# Patient Record
Sex: Male | Born: 1975 | Race: Black or African American | Hispanic: No | Smoking: Current some day smoker
Health system: Southern US, Community
[De-identification: ages and names within clinical notes are randomized; demographics above are authoritative.]

## PROBLEM LIST (undated history)

## (undated) DIAGNOSIS — K219 Gastro-esophageal reflux disease without esophagitis: Secondary | ICD-10-CM

## (undated) DIAGNOSIS — I10 Essential (primary) hypertension: Secondary | ICD-10-CM

## (undated) DIAGNOSIS — E785 Hyperlipidemia, unspecified: Secondary | ICD-10-CM

## (undated) HISTORY — DX: Hyperlipidemia, unspecified: E78.5

## (undated) HISTORY — PX: NO PAST SURGERIES: SHX2092

---

## 2005-01-01 ENCOUNTER — Emergency Department: Payer: Self-pay | Admitting: Emergency Medicine

## 2005-02-16 ENCOUNTER — Emergency Department: Payer: Self-pay | Admitting: Emergency Medicine

## 2006-03-23 ENCOUNTER — Emergency Department (HOSPITAL_COMMUNITY): Admission: EM | Admit: 2006-03-23 | Discharge: 2006-03-23 | Payer: Self-pay | Admitting: Emergency Medicine

## 2008-06-03 ENCOUNTER — Emergency Department (HOSPITAL_COMMUNITY): Admission: EM | Admit: 2008-06-03 | Discharge: 2008-06-03 | Payer: Self-pay | Admitting: Emergency Medicine

## 2008-08-14 ENCOUNTER — Emergency Department (HOSPITAL_COMMUNITY): Admission: EM | Admit: 2008-08-14 | Discharge: 2008-08-14 | Payer: Self-pay | Admitting: Emergency Medicine

## 2008-12-16 ENCOUNTER — Emergency Department (HOSPITAL_COMMUNITY): Admission: EM | Admit: 2008-12-16 | Discharge: 2008-12-16 | Payer: Self-pay | Admitting: Family Medicine

## 2010-09-02 LAB — TSH: TSH: 0.939 u[IU]/mL (ref 0.350–4.500)

## 2010-09-02 LAB — POCT I-STAT, CHEM 8
BUN: 14 mg/dL (ref 6–23)
Calcium, Ion: 1.16 mmol/L (ref 1.12–1.32)
Chloride: 105 mEq/L (ref 96–112)

## 2011-10-28 ENCOUNTER — Encounter (HOSPITAL_COMMUNITY): Payer: Self-pay | Admitting: Cardiology

## 2011-10-28 ENCOUNTER — Emergency Department (HOSPITAL_COMMUNITY)
Admission: EM | Admit: 2011-10-28 | Discharge: 2011-10-28 | Disposition: A | Discharge status: Home / Self Care | Payer: Self-pay | Attending: Emergency Medicine | Admitting: Emergency Medicine

## 2011-10-28 DIAGNOSIS — L02519 Cutaneous abscess of unspecified hand: Secondary | ICD-10-CM | POA: Insufficient documentation

## 2011-10-28 DIAGNOSIS — L03119 Cellulitis of unspecified part of limb: Secondary | ICD-10-CM | POA: Insufficient documentation

## 2011-10-28 DIAGNOSIS — L02416 Cutaneous abscess of left lower limb: Secondary | ICD-10-CM

## 2011-10-28 DIAGNOSIS — L02419 Cutaneous abscess of limb, unspecified: Secondary | ICD-10-CM | POA: Insufficient documentation

## 2011-10-28 DIAGNOSIS — E119 Type 2 diabetes mellitus without complications: Secondary | ICD-10-CM | POA: Insufficient documentation

## 2011-10-28 DIAGNOSIS — K219 Gastro-esophageal reflux disease without esophagitis: Secondary | ICD-10-CM | POA: Insufficient documentation

## 2011-10-28 DIAGNOSIS — L03019 Cellulitis of unspecified finger: Secondary | ICD-10-CM | POA: Insufficient documentation

## 2011-10-28 DIAGNOSIS — I10 Essential (primary) hypertension: Secondary | ICD-10-CM | POA: Insufficient documentation

## 2011-10-28 HISTORY — DX: Essential (primary) hypertension: I10

## 2011-10-28 HISTORY — DX: Gastro-esophageal reflux disease without esophagitis: K21.9

## 2011-10-28 MED ORDER — DOXYCYCLINE HYCLATE 100 MG PO CAPS: 100.0000 mg | ORAL_CAPSULE | Freq: Two times a day (BID) | ORAL | Status: DC

## 2011-10-28 MED ORDER — HYDROCODONE-ACETAMINOPHEN 5-325 MG PO TABS: 1.0000 | ORAL_TABLET | Freq: Four times a day (QID) | ORAL | Status: DC | PRN

## 2011-10-28 NOTE — ED Notes (Signed)
Provider at the bedside.  

## 2011-10-28 NOTE — Discharge Instructions (Signed)
Return here in 2 days for a recheck and packing removal. Keep area covered. Clean around the wound.

## 2011-10-28 NOTE — ED Provider Notes (Signed)
Medical screening examination/treatment/procedure(s) were performed by non-physician practitioner and as supervising physician I was immediately available for consultation/collaboration.   Hurman Horn, MD 10/28/11 2350

## 2011-10-28 NOTE — ED Notes (Addendum)
Pt to c/o boil to the inside of left thigh. States that it developed last week but has continued to get worse. Denies any fever or drainage.

## 2011-10-28 NOTE — ED Provider Notes (Signed)
History     CSN: [id number]  Arrival date & time 10/28/11  4098   First MD Initiated Contact with Patient 10/28/11 0745      Chief Complaint  Patient presents with  . Recurrent Skin Infections    (Consider location/radiation/quality/duration/timing/severity/associated sxs/prior treatment) HPI Patient presents emergency department with an abscess to his left middle upper thigh.  Patient states that it developed 2 days ago and it continued to worsen.  Patient states that he tried to soak in warm baths to alleviate the symptoms.  He states this did not seem to help.  Patient denies any fevers, nausea, vomiting, weakness, or dizziness.  Past Medical History  Diagnosis Date  . Hypertension   . Diabetes mellitus   . GERD (gastroesophageal reflux disease)     History reviewed. No pertinent past surgical history.  History reviewed. No pertinent family history.  History  Substance Use Topics  . Smoking status: Not on file  . Smokeless tobacco: Not on file  . Alcohol Use:       Review of Systems All other systems negative except as documented in the HPI. All pertinent positives and negatives as reviewed in the HPI.  Allergies  Review of patient's allergies indicates no known allergies.  Home Medications   Current Outpatient Rx  Name Route Sig Dispense Refill  . GLIPIZIDE 10 MG PO TABS Oral Take 10 mg by mouth 2 (two) times daily before a meal.    . METFORMIN HCL 500 MG PO TABS Oral Take 1,000 mg by mouth 2 (two) times daily with a meal.    . PANTOPRAZOLE SODIUM 40 MG PO TBEC Oral Take 40 mg by mouth daily.    . QUINAPRIL HCL 20 MG PO TABS Oral Take 20 mg by mouth daily.    Marland Kitchen SITAGLIPTIN PHOSPHATE 100 MG PO TABS Oral Take 100 mg by mouth daily.      BP 138/83  Pulse 85  Temp(Src) 98.8 F (37.1 C) (Oral)  Resp 20  SpO2 99%  Physical Exam  Constitutional: He is oriented to person, place, and time. He appears well-developed and well-nourished.  Neurological: He is  alert and oriented to person, place, and time.  Skin:       ED Course  Procedures (including critical care time)  INCISION AND DRAINAGE Performed by: Carlyle Dolly Consent: Verbal consent obtained. Risks and benefits: risks, benefits and alternatives were discussed Type: abscess  Body area: Left medial upper thigh  Anesthesia: local infiltration  Local anesthetic: lidocaine 2% w epinephrine  Anesthetic total: 8 ml  Complexity: complex Blunt dissection to break up loculations  Drainage: purulent  Drainage amount: large  Packing material: 1/4 in iodoform gauze  Patient tolerance: Patient tolerated the procedure well with no immediate complications.   Patient is advised to return here in 2 days for recheck and packing removal.  I told him to keep the area covered and clean around the wound.  There is no surrounding cellulitis noted.  However, there is some mild induration around the area of the abscess.  I advised him that we will treat with antibiotics due to his diabetic status  MDM          Carlyle Dolly, PA-C 10/28/11 [phone]

## 2011-10-30 ENCOUNTER — Encounter (HOSPITAL_COMMUNITY): Payer: Self-pay | Admitting: Emergency Medicine

## 2011-10-30 ENCOUNTER — Emergency Department (HOSPITAL_COMMUNITY)
Admission: EM | Admit: 2011-10-30 | Discharge: 2011-10-30 | Disposition: A | Discharge status: Home / Self Care | Payer: Self-pay | Attending: Emergency Medicine | Admitting: Emergency Medicine

## 2011-10-30 DIAGNOSIS — Z4800 Encounter for change or removal of nonsurgical wound dressing: Secondary | ICD-10-CM | POA: Insufficient documentation

## 2011-10-30 DIAGNOSIS — L0291 Cutaneous abscess, unspecified: Secondary | ICD-10-CM

## 2011-10-30 DIAGNOSIS — K219 Gastro-esophageal reflux disease without esophagitis: Secondary | ICD-10-CM | POA: Insufficient documentation

## 2011-10-30 DIAGNOSIS — E119 Type 2 diabetes mellitus without complications: Secondary | ICD-10-CM | POA: Insufficient documentation

## 2011-10-30 DIAGNOSIS — L02219 Cutaneous abscess of trunk, unspecified: Secondary | ICD-10-CM | POA: Insufficient documentation

## 2011-10-30 DIAGNOSIS — I10 Essential (primary) hypertension: Secondary | ICD-10-CM | POA: Insufficient documentation

## 2011-10-30 NOTE — ED Notes (Signed)
Pt reports he was seen here two days ago and told to return for wound packing removal to left groin.

## 2011-10-30 NOTE — ED Provider Notes (Signed)
History     CSN: [id number]  Arrival date & time 10/30/11  4098   First MD Initiated Contact with Patient 10/30/11 [phone]      Chief Complaint  Patient presents with  . Wound Check    (Consider location/radiation/quality/duration/timing/severity/associated sxs/prior treatment) Patient is a 36 y.o. male presenting with wound check. The history is provided by the patient. No language interpreter was used.  Wound Check  He was treated in the ED 2 to 3 days ago. Previous treatment in the ED includes I&D of abscess. Treatments since wound repair include oral antibiotics. There has been no drainage from the wound. The redness has improved. There is no swelling present. The pain has no pain.  Pt here for packing removal  Past Medical History  Diagnosis Date  . Hypertension   . Diabetes mellitus   . GERD (gastroesophageal reflux disease)     History reviewed. No pertinent past surgical history.  History reviewed. No pertinent family history.  History  Substance Use Topics  . Smoking status: Not on file  . Smokeless tobacco: Not on file  . Alcohol Use:       Review of Systems  Skin: Positive for wound.  All other systems reviewed and are negative.    Allergies  Review of patient's allergies indicates no known allergies.  Home Medications   Current Outpatient Rx  Name Route Sig Dispense Refill  . DOXYCYCLINE HYCLATE 100 MG PO CAPS Oral Take 100 mg by mouth 2 (two) times daily.    Marland Kitchen GLIPIZIDE 10 MG PO TABS Oral Take 10 mg by mouth 2 (two) times daily before a meal.    . IBUPROFEN 200 MG PO TABS Oral Take 800 mg by mouth every 6 (six) hours as needed. For pain    . METFORMIN HCL 500 MG PO TABS Oral Take 1,000 mg by mouth 2 (two) times daily with a meal.    . PANTOPRAZOLE SODIUM 40 MG PO TBEC Oral Take 40 mg by mouth daily.    . QUINAPRIL HCL 20 MG PO TABS Oral Take 20 mg by mouth daily.    Marland Kitchen SITAGLIPTIN PHOSPHATE 100 MG PO TABS Oral Take 100 mg by mouth daily.      BP  132/85  Pulse 76  Temp(Src) 98 F (36.7 C) (Oral)  Resp 18  SpO2 98%  Physical Exam  Constitutional: He is oriented to person, place, and time. He appears well-developed and well-nourished.  HENT:  Head: Normocephalic and atraumatic.  Musculoskeletal: Normal range of motion.       Packing removed left groin,  Abscess healing  Neurological: He is alert and oriented to person, place, and time. He has normal reflexes.  Skin: Skin is warm.  Psychiatric: He has a normal mood and affect.    ED Course  Procedures (including critical care time)  Labs Reviewed - No data to display No results found.   1. Abscess       MDM          Elson Areas, [place] 10/30/11 [phone]

## 2011-10-30 NOTE — ED Provider Notes (Signed)
Medical screening examination/treatment/procedure(s) were performed by non-physician practitioner and as supervising physician I was immediately available for consultation/collaboration.   Leigh-Ann Jalena Vanderlinden, MD 10/30/11 1055 

## 2011-10-30 NOTE — Discharge Instructions (Signed)

## 2012-07-01 ENCOUNTER — Emergency Department (INDEPENDENT_AMBULATORY_CARE_PROVIDER_SITE_OTHER)
Admission: EM | Admit: 2012-07-01 | Discharge: 2012-07-01 | Disposition: A | Discharge status: Home / Self Care | Payer: Self-pay | Source: Home / Self Care | Attending: Emergency Medicine | Admitting: Emergency Medicine

## 2012-07-01 ENCOUNTER — Encounter (HOSPITAL_COMMUNITY): Payer: Self-pay

## 2012-07-01 ENCOUNTER — Emergency Department (INDEPENDENT_AMBULATORY_CARE_PROVIDER_SITE_OTHER): Payer: Self-pay

## 2012-07-01 DIAGNOSIS — S6990XA Unspecified injury of unspecified wrist, hand and finger(s), initial encounter: Secondary | ICD-10-CM

## 2012-07-01 DIAGNOSIS — S6980XA Other specified injuries of unspecified wrist, hand and finger(s), initial encounter: Secondary | ICD-10-CM
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
## 2013-05-03 ENCOUNTER — Encounter (HOSPITAL_COMMUNITY): Payer: Self-pay | Admitting: Emergency Medicine

## 2013-05-03 ENCOUNTER — Emergency Department (INDEPENDENT_AMBULATORY_CARE_PROVIDER_SITE_OTHER)
Admission: EM | Admit: 2013-05-03 | Discharge: 2013-05-03 | Disposition: A | Discharge status: Home / Self Care | Payer: Self-pay | Source: Home / Self Care | Attending: Family Medicine | Admitting: Family Medicine

## 2013-05-03 DIAGNOSIS — R05 Cough: Secondary | ICD-10-CM

## 2013-05-03 MED ORDER — IPRATROPIUM BROMIDE 0.06 % NA SOLN: 2.0000 | Freq: Four times a day (QID) | NASAL | Status: DC

## 2013-05-03 MED ORDER — HYDROCODONE-ACETAMINOPHEN 5-325 MG PO TABS: 0.5000 | ORAL_TABLET | Freq: Every evening | ORAL | Status: DC | PRN

## 2013-05-03 MED ORDER — AZITHROMYCIN 250 MG PO TABS: 250.0000 mg | ORAL_TABLET | Freq: Every day | ORAL | Status: DC

## 2013-05-03 NOTE — ED Provider Notes (Signed)
Christopher Abbott is a 37 y.o. male who presents to Urgent Care today for cough congestion and mild throat tickling present for the last several days. Patient recently traveled to [place]. He typically gets his symptoms this time either when he travels. He has tried some over-the-counter medications including Clarinex which have only helped a little. He denies any shortness of breath fevers chills nausea vomiting or diarrhea. He denies any chest pains or palpitations as well. 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 He is worried that he will be worse by then.    Past Medical History  Diagnosis Date  . Hypertension   . Diabetes mellitus   . GERD (gastroesophageal reflux disease)    History  Substance Use Topics  . Smoking status: Current Every Day Smoker  . Smokeless tobacco: Not on file  . Alcohol Use: Yes   ROS as above Medications reviewed. No current facility-administered medications for this encounter.   Current Outpatient Prescriptions  Medication Sig Dispense Refill  . azithromycin (ZITHROMAX) 250 MG tablet Take 1 tablet (250 mg total) by mouth daily. Take first 2 tablets together, then 1 every day until finished.  6 tablet  0  . glipiZIDE (GLUCOTROL) 10 MG tablet Take 10 mg by mouth 2 (two) times daily before a meal.      . HYDROcodone-acetaminophen (NORCO/VICODIN) 5-325 MG per tablet Take 0.5 tablets by mouth at bedtime as needed (cough).  6 tablet  0  . ibuprofen (ADVIL,MOTRIN) 200 MG tablet Take 800 mg by mouth every 6 (six) hours as needed. For pain      . ipratropium (ATROVENT) 0.06 % nasal spray Place 2 sprays into both nostrils 4 (four) times daily.  15 mL  1  . metFORMIN (GLUCOPHAGE) 500 MG tablet Take 1,000 mg by mouth 2 (two) times daily with a meal.      . pantoprazole (PROTONIX) 40 MG tablet Take 40 mg by mouth daily.      . quinapril (ACCUPRIL) 20 MG tablet Take 20 mg by mouth daily.       . sitaGLIPtin (JANUVIA) 100 MG tablet Take 100 mg by mouth daily.        Exam:  BP 145/113  Pulse 92  Temp(Src) 98 F (36.7 C) (Oral)  Resp 16  SpO2 96% Gen: Well NAD HEENT: EOMI,  MMM, tympanic membranes are normal appearing bilaterally. Posterior pharynx with cobblestoning. Lungs: Normal work of breathing. CTABL Heart: RRR no MRG Abd: NABS, Soft. NT, ND Exts: Non edematous BL  LE, warm and well perfused.    Assessment and Plan: 37 y.o. male with cough. Likely postviral. Postnasal drip is a contributing factor. Plan to treat with Atrovent nasal spray, as well as hydrocodone containing cough medication. Additionally I have prescribed azithromycin to use if patient does not improve. Discussed warning signs or symptoms. Please see discharge instructions. Patient expresses understanding.      Rodolph Bong, MD 05/03/13 [phone]

## 2013-05-03 NOTE — ED Notes (Signed)
C/o cough and itchy throat OTC medications taken but no relief. States cough is dry

## 2013-08-27 ENCOUNTER — Encounter (HOSPITAL_COMMUNITY): Payer: Self-pay | Admitting: Emergency Medicine

## 2013-08-27 ENCOUNTER — Emergency Department (INDEPENDENT_AMBULATORY_CARE_PROVIDER_SITE_OTHER)
Admission: EM | Admit: 2013-08-27 | Discharge: 2013-08-27 | Disposition: A | Discharge status: Home / Self Care | Payer: Self-pay | Source: Home / Self Care | Attending: Emergency Medicine | Admitting: Emergency Medicine

## 2013-08-27 DIAGNOSIS — L0291 Cutaneous abscess, unspecified: Secondary | ICD-10-CM

## 2013-08-27 DIAGNOSIS — L0390 Cellulitis, unspecified: Secondary | ICD-10-CM

## 2013-08-27 MED ORDER — HYDROCODONE-ACETAMINOPHEN 5-325 MG PO TABS: ORAL_TABLET | ORAL | Status: DC

## 2013-08-27 MED ORDER — SULFAMETHOXAZOLE-TMP DS 800-160 MG PO TABS: 2.0000 | ORAL_TABLET | Freq: Two times a day (BID) | ORAL | Status: DC

## 2013-08-27 MED ORDER — MUPIROCIN 2 % EX OINT: 1.0000 "application " | TOPICAL_OINTMENT | Freq: Three times a day (TID) | CUTANEOUS | Status: DC
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
## 2013-08-27 NOTE — Discharge Instructions (Signed)
You have had an abscess drained.  An abscess is a collection of pus caused by infection with skin bacteria such as Streptococcus or Staphylococcus.  Since this is and infection, you may be contagious. ° °For the first 2 days, leave the dressing in place and keep it clean and dry. This means you should not get it wet.  You will have to take a sponge bath rather than a shower.  If the abscess was packed, we may instruct you to come back in 2 to 3 days to have the packing removed.  If the abscess was not packed, you may remove the dressing yourself in 2 days and take care of the wound yourself. ° °After the packing is out, change the dressing at least once a day.  You may bathe or shower once the packing has been removed.  Assemble all the dressing material before you change the dressing, wear gloves, dispose of the soiled dressing material well and wash your hands before and after changing the dressing.  Wash the area well with soap and water, taking care to remove all the dried blood and drainage.  Apply a thin layer of antibiotic ointment (Bacitracin or Polysporin) around the abscess cavity, then apply a gauze dressing.  You may want to use a non-adherent dressing like Telfa.  Fasten this in place well with tape.  Continue to change the dressing until there is no further drainage. ° °Finish up the entire prescription of any antibiotics that you have been given. ° °Take infectious precautions since the bacteria that cause these abscesses may be contagious.  Wash hands frequently or use hand sanitizer, especially after touching the abscess area or changing dressings.  Do not allow anyone else to use your towel or washcloth and wash these items after each use until the abscess has healed.  You may want to use an antibacterial soap such as Dial or Safeguard or a prescription body wash like Hibiclens.  You also may want to consider spraying the tub or shower with a disinfectant such a Lysol until the abscess has  healed. ° °Things that should prompt you to return to the office for a recheck include:  Fever over 100 degrees, increasing pain or drainage, failure of the abscess to heal after 10 days, or other skin lesions elsewhere.  ° °Bacterial infection is a leading cause of boils, abscesses and skin infections.  While this can be the cause of serious infections, it is most often easily treated, prevented, and cured. ° °This bacteria,like other bacterial infections, is something you catch from somebody or something.  It can be transmitted from family, friends, casual acquaintances or even pets by close person-to-person contact or contact with an infected object.  Bacteria live on the skin and in the nasal passages.  It can invade into the skin through a break in the skin or sometimes it just invades by itself into a skin pore causing an area of redness, swelling and pain.  When this happens, it can cause a sticking sensation, so many people mistake it for an insect bite.   ° °If it causes a collection of pus (a boil or abscess) it should be drained.  Often packing or a drain will be left in place to allow the pus to drain for a few days.  This packing will need to be removed after 2 to 3 days.  If the wound is deep, it may need to be repacked.  If there is no collection of   pus (cellulitis) incision and drainage is not immediately necessary, but antibiotics will be prescribed.  Sometimes cellulitis will turn into an abscess, so if symptoms persist, it's best to return for a recheck. ° °With bacterial infection, recurrence can be a problem.  This is because the bacteria can continue to live on the skin and in the nasal passages, presenting a risk for re-infection.  There are some steps that you can take to completely eradicate the infection and thus prevent future infections: ° °· Finish up your antibiotics completely.   °· Bacteria often take up residence in the nasal passages.  If they are not eliminated from this reservoir,  the infection will return again and again.  Apply an antibiotic ointment, mupirocin, to both nostrils 3 times daily for a month.  °· Decontamination of the skin is also necessary. The current recommendation is Clorox baths twice weekly for 3 months.  This can be done by diluting 4 oz of Clorox bleach in a tub of bathwater.  Then soak in this Clorox solution up to your neck for 15 minutes. When you get out of the tub, do not towel dry, allow the Clorox water to dry off your skin on its own.  °· Take infectious precautions:  Wash or sanitize hands frequently, spray tub or shower with Lysol after use, us towels or wash cloths only once, then launder, do not share towels or wash cloths with anyone else, do not share clothing with anyone else, launder clothing and bed clothes frequently. ° ° ° ° °

## 2013-08-27 NOTE — ED Provider Notes (Signed)
  Chief Complaint   Chief Complaint  Patient presents with  . Recurrent Skin Infections    History of Present Illness   Christopher Abbott is a 38 year old diabetic male who has a 4 to five-day history of a painful boil in his right groin. He has a history of a boil previously in his left groin. No definite history of MRSA. He denies any drainage from the boil, fever, or chills.  Review of Systems   Other than as noted above, the patient denies any of the following symptoms: Systemic:  No fever, chills or sweats. Skin:  No rash or itching.  PMFSH   Past medical history, family history, social history, meds, and allergies were reviewed. He has diabetes and high blood pressure. He takes insulin, Janumet, quinapril, and Nexium.  Physical Examination     Vital signs:  BP 139/84  Pulse 90  Temp(Src) 99.1 F (37.3 C) (Oral)  Resp 14  SpO2 98% Skin:  There is a 2.5 x 2.5 cm tender, fluctuant, raised nodule in the right groin.  Skin exam was otherwise normal.  No rash. Ext:  Distal pulses were full, patient has full ROM of all joints.  Procedure   Verbal informed consent was obtained.  The patient was informed of the risks and benefits of the procedure and understands and accepts.  A time out was called and the identity of the patient and correct procedure was confirmed.   The abscess area described above was prepped with Betadine and alcohol and anesthetized with ethyl chloride spray and 5 mL of 2% Xylocaine with epinephrine.  Using a #11 scalpel blade, a singe straight incision was made into the area of fluctulence, yielding a moderate amount of prurulent drainage.  Routine cultures were obtained.  Blunt dissection was used to break up loculations and the resulting wound cavity was packed with 1/4 inch Iodoform gauze.  A sterile pressure dressing was applied.  Assessment   The encounter diagnosis was Abscess.  Plan     1.  Meds:  The following meds were prescribed:   Discharge  Medication List as of 08/27/2013  6:47 PM    START taking these medications   Details  !! HYDROcodone-acetaminophen (NORCO/VICODIN) 5-325 MG per tablet 1 to 2 tabs every 4 to 6 hours as needed for pain., Print    mupirocin ointment (BACTROBAN) 2 % Apply 1 application topically 3 (three) times daily., Starting 08/27/2013, Until Discontinued, Normal    sulfamethoxazole-trimethoprim (BACTRIM DS) 800-160 MG per tablet Take 2 tablets by mouth 2 (two) times daily., Starting 08/27/2013, Until Discontinued, Normal     !! - Potential duplicate medications found. Please discuss with provider.      2.  Patient Education/Counseling:  The patient was given appropriate handouts, self care instructions, and instructed in symptomatic relief.    3.  Follow up:  The patient was instructed to leave the dressing in place and return here again in 48 hours for packing removal, or sooner if becoming worse in any way, and given some red flag symptoms such as fever which would prompt immediate return.       Reuben Likesavid C Lezlie Ritchey, MD 08/27/13 2115

## 2013-08-31 ENCOUNTER — Emergency Department (INDEPENDENT_AMBULATORY_CARE_PROVIDER_SITE_OTHER)
Admission: EM | Admit: 2013-08-31 | Discharge: 2013-08-31 | Disposition: A | Discharge status: Home / Self Care | Payer: Self-pay | Source: Home / Self Care

## 2013-08-31 ENCOUNTER — Encounter (HOSPITAL_COMMUNITY): Payer: Self-pay | Admitting: Emergency Medicine

## 2013-08-31 DIAGNOSIS — L02219 Cutaneous abscess of trunk, unspecified: Secondary | ICD-10-CM

## 2013-08-31 DIAGNOSIS — L02419 Cutaneous abscess of limb, unspecified: Secondary | ICD-10-CM

## 2013-08-31 DIAGNOSIS — L03319 Cellulitis of trunk, unspecified: Secondary | ICD-10-CM

## 2013-08-31 DIAGNOSIS — L03115 Cellulitis of right lower limb: Secondary | ICD-10-CM

## 2013-08-31 DIAGNOSIS — L02214 Cutaneous abscess of groin: Secondary | ICD-10-CM

## 2013-08-31 DIAGNOSIS — L03119 Cellulitis of unspecified part of limb: Secondary | ICD-10-CM

## 2013-08-31 LAB — CULTURE, ROUTINE-ABSCESS
Culture: NO GROWTH
Gram Stain: NONE SEEN

## 2013-08-31 MED ORDER — OXYCODONE-ACETAMINOPHEN 5-325 MG PO TABS: 1.0000 | ORAL_TABLET | ORAL | Status: DC | PRN

## 2013-08-31 MED ORDER — CLINDAMYCIN HCL 300 MG PO CAPS: 300.0000 mg | ORAL_CAPSULE | Freq: Four times a day (QID) | ORAL | Status: DC

## 2013-08-31 NOTE — Discharge Instructions (Signed)
Cellulitis Frequent warm compresses Cellulitis is an infection of the skin and the tissue beneath it. The infected area is usually red and tender. Cellulitis occurs most often in the arms and lower legs.  CAUSES  Cellulitis is caused by bacteria that enter the skin through cracks or cuts in the skin. The most common types of bacteria that cause cellulitis are Staphylococcus and Streptococcus. SYMPTOMS   Redness and warmth.  Swelling.  Tenderness or pain.  Fever. DIAGNOSIS  Your caregiver can usually determine what is wrong based on a physical exam. Blood tests may also be done. TREATMENT  Treatment usually involves taking an antibiotic medicine. HOME CARE INSTRUCTIONS   Take your antibiotics as directed. Finish them even if you start to feel better.  Keep the infected arm or leg elevated to reduce swelling.  Apply a warm cloth to the affected area up to 4 times per day to relieve pain.  Only take over-the-counter or prescription medicines for pain, discomfort, or fever as directed by your caregiver.  Keep all follow-up appointments as directed by your caregiver. SEEK MEDICAL CARE IF:   You notice red streaks coming from the infected area.  Your red area gets larger or turns dark in color.  Your bone or joint underneath the infected area becomes painful after the skin has healed.  Your infection returns in the same area or another area.  You notice a swollen bump in the infected area.  You develop new symptoms. SEEK IMMEDIATE MEDICAL CARE IF:   You have a fever.  You feel very sleepy.  You develop vomiting or diarrhea.  You have a general ill feeling (malaise) with muscle aches and pains. MAKE SURE YOU:   Understand these instructions.  Will watch your condition.  Will get help right away if you are not doing well or get worse. Document Released: 02/20/2005 Document Revised: 11/12/2011 Document Reviewed: 07/29/2011 [facility] Patient Information 2014  [place]Care, [place].

## 2013-08-31 NOTE — ED Provider Notes (Signed)
CSN: [id number]     Arrival date & time 08/31/13  0900 History   First MD Initiated Contact with Patient 08/31/13 [phone]     Chief Complaint  Patient presents with  . Follow-up   (Consider location/radiation/quality/duration/timing/severity/associated sxs/prior Treatment) HPI Comments: Wound check following I and D of an abscess within the R groin. St pain continues without improvement and may be getting worse.  The elongated area of induration  Measures approx 6 x 3.5 cm. Not palpate as fluctuant.  There is cutaneous erythema developing distally over the medial thigh.  He had pulled the packing out 2 d post I and D. No purulent drainage now, just scant serous fluid.  Not regularly applying warm compresses   Past Medical History  Diagnosis Date  . Hypertension   . Diabetes mellitus   . GERD (gastroesophageal reflux disease)    History reviewed. No pertinent past surgical history. History reviewed. No pertinent family history. History  Substance Use Topics  . Smoking status: Current Every Day Smoker  . Smokeless tobacco: Not on file  . Alcohol Use: Yes    Review of Systems  Constitutional: Negative.   Skin: Positive for color change and wound.       As per HPI    Allergies  Review of patient's allergies indicates no known allergies.  Home Medications   Current Outpatient Rx  Name  Route  Sig  Dispense  Refill  . azithromycin (ZITHROMAX) 250 MG tablet   Oral   Take 1 tablet (250 mg total) by mouth daily. Take first 2 tablets together, then 1 every day until finished.   6 tablet   0   . glipiZIDE (GLUCOTROL) 10 MG tablet   Oral   Take 10 mg by mouth 2 (two) times daily before a meal.         . HYDROcodone-acetaminophen (NORCO/VICODIN) 5-325 MG per tablet   Oral   Take 0.5 tablets by mouth at bedtime as needed (cough).   6 tablet   0   . metFORMIN (GLUCOPHAGE) 500 MG tablet   Oral   Take 1,000 mg by mouth 2 (two) times daily with a meal.         . mupirocin  ointment (BACTROBAN) 2 %   Topical   Apply 1 application topically 3 (three) times daily.   22 g   0   . pantoprazole (PROTONIX) 40 MG tablet   Oral   Take 40 mg by mouth daily.         . quinapril (ACCUPRIL) 20 MG tablet   Oral   Take 20 mg by mouth daily.         . sitaGLIPtin (JANUVIA) 100 MG tablet   Oral   Take 100 mg by mouth daily.         . clindamycin (CLEOCIN) 300 MG capsule   Oral   Take 1 capsule (300 mg total) by mouth 4 (four) times daily. X 10 days   28 capsule   0   . HYDROcodone-acetaminophen (NORCO/VICODIN) 5-325 MG per tablet      1 to 2 tabs every 4 to 6 hours as needed for pain.   20 tablet   0   . ibuprofen (ADVIL,MOTRIN) 200 MG tablet   Oral   Take 800 mg by mouth every 6 (six) hours as needed. For pain         . ipratropium (ATROVENT) 0.06 % nasal spray   Each Nare   Place 2 sprays  into both nostrils 4 (four) times daily.   15 mL   1   . oxyCODONE-acetaminophen (ROXICET) 5-325 MG per tablet   Oral   Take 1 tablet by mouth every 4 (four) hours as needed for severe pain.   15 tablet   0   . sulfamethoxazole-trimethoprim (BACTRIM DS) 800-160 MG per tablet   Oral   Take 2 tablets by mouth 2 (two) times daily.   40 tablet   0    BP 126/85  Pulse 80  Temp(Src) 98.5 F (36.9 C) (Oral)  Resp 16  SpO2 98% Physical Exam  Nursing note and vitals reviewed. Constitutional: He is oriented to person, place, and time. He appears well-developed and well-nourished. No distress.  Neurological: He is alert and oriented to person, place, and time.  Skin: Skin is warm and dry. No rash noted. There is erythema.  See HPI for wound description.  Psychiatric: He has a normal mood and affect.    ED Course  Procedures (including critical care time) Labs Review Labs Reviewed - No data to display Imaging Review No results found.   MDM   1. Cellulitis of right thigh   2. Soft tissue abscess of inguinal region     The prelim culture is  No Growth. No identifiable organisms Cont the Septra and add clindamycin Percocet 5 mg #15 Rech in 2 days, sooner if worse.     Hayden Rasmussenavid Alhassan Everingham, NP 08/31/13 [phone]

## 2013-08-31 NOTE — ED Provider Notes (Signed)
Medical screening examination/treatment/procedure(s) were performed by resident physician or non-physician practitioner and as supervising physician I was immediately available for consultation/collaboration.   Barkley BrunsKINDL,Doreatha Offer DOUGLAS MD.   Linna HoffJames D Nohelani Benning, MD 08/31/13 [phone]

## 2013-08-31 NOTE — ED Notes (Signed)
Pt here for right groin abscess.  Still having pain.  States swelling going down side of right leg.

## 2013-09-01 ENCOUNTER — Encounter (HOSPITAL_COMMUNITY): Payer: Self-pay | Admitting: Emergency Medicine

## 2013-09-01 ENCOUNTER — Emergency Department (HOSPITAL_COMMUNITY)
Admission: EM | Admit: 2013-09-01 | Discharge: 2013-09-01 | Disposition: A | Discharge status: Home / Self Care | Payer: Self-pay | Attending: Emergency Medicine | Admitting: Emergency Medicine

## 2013-09-01 DIAGNOSIS — I10 Essential (primary) hypertension: Secondary | ICD-10-CM | POA: Insufficient documentation

## 2013-09-01 DIAGNOSIS — L02419 Cutaneous abscess of limb, unspecified: Secondary | ICD-10-CM | POA: Insufficient documentation

## 2013-09-01 DIAGNOSIS — F17200 Nicotine dependence, unspecified, uncomplicated: Secondary | ICD-10-CM | POA: Insufficient documentation

## 2013-09-01 DIAGNOSIS — K219 Gastro-esophageal reflux disease without esophagitis: Secondary | ICD-10-CM | POA: Insufficient documentation

## 2013-09-01 DIAGNOSIS — L02415 Cutaneous abscess of right lower limb: Secondary | ICD-10-CM

## 2013-09-01 DIAGNOSIS — T4995XA Adverse effect of unspecified topical agent, initial encounter: Secondary | ICD-10-CM | POA: Insufficient documentation

## 2013-09-01 DIAGNOSIS — Z79899 Other long term (current) drug therapy: Secondary | ICD-10-CM | POA: Insufficient documentation

## 2013-09-01 DIAGNOSIS — Z792 Long term (current) use of antibiotics: Secondary | ICD-10-CM | POA: Insufficient documentation

## 2013-09-01 DIAGNOSIS — L03119 Cellulitis of unspecified part of limb: Secondary | ICD-10-CM

## 2013-09-01 DIAGNOSIS — T783XXA Angioneurotic edema, initial encounter: Secondary | ICD-10-CM | POA: Insufficient documentation

## 2013-09-01 DIAGNOSIS — E119 Type 2 diabetes mellitus without complications: Secondary | ICD-10-CM | POA: Insufficient documentation

## 2013-09-01 LAB — CBC WITH DIFFERENTIAL/PLATELET
Basophils Absolute: 0 10*3/uL (ref 0.0–0.1)
Basophils Relative: 1 % (ref 0–1)
EOS ABS: 0.2 10*3/uL (ref 0.0–0.7)
EOS PCT: 2 % (ref 0–5)
HEMATOCRIT: 42.4 % (ref 39.0–52.0)
Hemoglobin: 15.2 g/dL (ref 13.0–17.0)
LYMPHS ABS: 1.6 10*3/uL (ref 0.7–4.0)
LYMPHS PCT: 25 % (ref 12–46)
MCH: 32.1 pg (ref 26.0–34.0)
MCHC: 35.8 g/dL (ref 30.0–36.0)
MCV: 89.5 fL (ref 78.0–100.0)
MONO ABS: 1 10*3/uL (ref 0.1–1.0)
MONOS PCT: 16 % — AB (ref 3–12)
Neutro Abs: 3.8 10*3/uL (ref 1.7–7.7)
Neutrophils Relative %: 56 % (ref 43–77)
Platelets: 239 10*3/uL (ref 150–400)
RBC: 4.74 MIL/uL (ref 4.22–5.81)
RDW: 13.1 % (ref 11.5–15.5)
WBC: 6.6 10*3/uL (ref 4.0–10.5)

## 2013-09-01 LAB — BASIC METABOLIC PANEL
BUN: 13 mg/dL (ref 6–23)
CALCIUM: 9.4 mg/dL (ref 8.4–10.5)
CO2: 23 meq/L (ref 19–32)
CREATININE: 1.14 mg/dL (ref 0.50–1.35)
Chloride: 102 mEq/L (ref 96–112)
GFR calc Af Amer: >90 mL/min (ref >90)
GFR, EST NON AFRICAN AMERICAN: 82 mL/min — AB (ref >90)
GLUCOSE: 100 mg/dL — AB (ref 70–99)
Potassium: 4 mEq/L (ref 3.7–5.3)
Sodium: 140 mEq/L (ref 137–147)

## 2013-09-01 LAB — SEDIMENTATION RATE: Sed Rate: 30 mm/hr — ABNORMAL HIGH (ref 0–16)

## 2013-09-01 MED ORDER — DIPHENHYDRAMINE HCL 50 MG/ML IJ SOLN
25.0000 mg | Freq: Once | INTRAMUSCULAR | Status: AC
  Administered 2013-09-01: 25 mg via INTRAVENOUS
  Filled 2013-09-01: qty 1

## 2013-09-01 MED ORDER — OXYCODONE-ACETAMINOPHEN 5-325 MG PO TABS
1.0000 | ORAL_TABLET | Freq: Once | ORAL | Status: AC
  Administered 2013-09-01: 1 via ORAL
  Filled 2013-09-01: qty 1

## 2013-09-01 MED ORDER — FAMOTIDINE IN NACL 20-0.9 MG/50ML-% IV SOLN
20.0000 mg | Freq: Once | INTRAVENOUS | Status: AC
  Administered 2013-09-01: 20 mg via INTRAVENOUS
  Filled 2013-09-01: qty 50

## 2013-09-01 NOTE — Discharge Planning (Signed)
[facility] Felicia E, [employer] [employer]iaison  Spoke to patient about primary care resources and establishing care with a provider. Patient states he is seen on a primary care basis at the [facility] [place] and has been seen at the practice for years. Patient states he is comfortable at the clinic and does not want to change at this time. I provided the patient with the orange card application and instructions for future uses if needed. Patient was also given my contact information for any future questions or concerns.

## 2013-09-01 NOTE — Discharge Instructions (Signed)
Continue taking your Clindamycin. Stop taking Quinapril (Accupril) - that is what was making your lip/cheek swell. You should never take any ACE inhibitor again, or you could have the same reaction.   Angioedema Angioedema is a sudden swelling of tissues, often of the skin. It can occur on the face or genitals or in the abdomen or other body parts. The swelling usually develops over a short period and gets better in 24 to 48 hours. It often begins during the night and is found when the person wakes up. The person may also get red, itchy patches of skin (hives). Angioedema can be dangerous if it involves swelling of the air passages.  Depending on the cause, episodes of angioedema may only happen once, come back in unpredictable patterns, or repeat for several years and then gradually fade away.  CAUSES  Angioedema can be caused by an allergic reaction to various triggers. It can also result from nonallergic causes, including reactions to drugs, immune system disorders, viral infections, or an abnormal gene that is passed to you from your parents (hereditary). For some people with angioedema, the cause is unknown.  Some things that can trigger angioedema include:   Foods.   Medicines, such as ACE inhibitors, ARBs, nonsteroidal anti-inflammatory agents, or estrogen.   Latex.   Animal saliva.   Insect stings.   Dyes used in X-rays.   Mild injury.   Dental work.  Surgery.  Stress.   Sudden changes in temperature.   Exercise. SIGNS AND SYMPTOMS   Swelling of the skin.  Hives. If these are present, there is also intense itching.  Redness in the affected area.   Pain in the affected area.  Swollen lips or tongue.  Breathing problems. This may happen if the air passages swell.  Wheezing. If internal organs are involved, there may be:   Nausea.   Abdominal pain.   Vomiting.   Difficulty swallowing.   Difficulty passing urine. DIAGNOSIS   Your health  care provider will examine the affected area and take a medical and family history.  Various tests may be done to help determine the cause. Tests may include:  Allergy skin tests to see if the problem is an allergic reaction.   Blood tests to check for hereditary angioedema.   Tests to check for underlying diseases that could cause the condition.   A review of your medicines, including over the counter medicines, may be done. TREATMENT  Treatment will depend on the cause of the angioedema. Possible treatments include:   Removal of anything that triggered the condition (such as stopping certain medicines).   Medicines to treat symptoms or prevent attacks. Medicines given may include:   Antihistamines.   Epinephrine injection.   Steroids.   Hospitalization may be required for severe attacks. If the air passages are affected, it can be an emergency. Tubes may need to be placed to keep the airway open. HOME CARE INSTRUCTIONS   Only take over-the-counter or prescription medicines as directed by your health care provider.  If you were given medicines for emergency allergy treatment, always carry them with you.  Wear a medical bracelet as directed by your health care provider.   Avoid known triggers. SEEK MEDICAL CARE IF:   You have repeat attacks of angioedema.   Your attacks are more frequent or more severe despite preventive measures.   You have hereditary angioedema and are considering having children. It is important to discuss the risks of passing the condition on to your  children with your health care provider. SEEK IMMEDIATE MEDICAL CARE IF:   You have severe swelling of the mouth, tongue, or lips.  You have difficulty breathing.   You have difficulty swallowing.   You faint. MAKE SURE YOU:  Understand these instructions.  Will watch your condition.  Will get help right away if you are not doing well or get worse. Document Released: 07/22/2001  Document Revised: 03/03/2013 Document Reviewed: 01/04/2013 [facility] Patient Information 2014 [employer], [place].   Abscess An abscess is an infected area that contains a collection of pus and debris.It can occur in almost any part of the body. An abscess is also known as a furuncle or boil. CAUSES  An abscess occurs when tissue gets infected. This can occur from blockage of oil or sweat glands, infection of hair follicles, or a minor injury to the skin. As the body tries to fight the infection, pus collects in the area and creates pressure under the skin. This pressure causes pain. People with weakened immune systems have difficulty fighting infections and get certain abscesses more often.  SYMPTOMS Usually an abscess develops on the skin and becomes a painful mass that is red, warm, and tender. If the abscess forms under the skin, you may feel a moveable soft area under the skin. Some abscesses break open (rupture) on their own, but most will continue to get worse without care. The infection can spread deeper into the body and eventually into the bloodstream, causing you to feel ill.  DIAGNOSIS  Your caregiver will take your medical history and perform a physical exam. A sample of fluid may also be taken from the abscess to determine what is causing your infection. TREATMENT  Your caregiver may prescribe antibiotic medicines to fight the infection. However, taking antibiotics alone usually does not cure an abscess. Your caregiver may need to make a small cut (incision) in the abscess to drain the pus. In some cases, gauze is packed into the abscess to reduce pain and to continue draining the area. HOME CARE INSTRUCTIONS   Only take over-the-counter or prescription medicines for pain, discomfort, or fever as directed by your caregiver.  If you were prescribed antibiotics, take them as directed. Finish them even if you start to feel better.  If gauze is used, follow your caregiver's directions for  changing the gauze.  To avoid spreading the infection:  Keep your draining abscess covered with a bandage.  Wash your hands well.  Do not share personal care items, towels, or whirlpools with others.  Avoid skin contact with others.  Keep your skin and clothes clean around the abscess.  Keep all follow-up appointments as directed by your caregiver. SEEK MEDICAL CARE IF:   You have increased pain, swelling, redness, fluid drainage, or bleeding.  You have muscle aches, chills, or a general ill feeling.  You have a fever. MAKE SURE YOU:   Understand these instructions.  Will watch your condition.  Will get help right away if you are not doing well or get worse. Document Released: 02/20/2005 Document Revised: 11/12/2011 Document Reviewed: 07/26/2011 [facility] Patient Information 2014 [employer], [place].

## 2013-09-01 NOTE — ED Notes (Signed)
Patient reports he went to Urgent Care on Friday for boil on upper right leg, and was started on antibiotics. He woke up this morning and noticed his jaw and leg started swelling, and his leg began hurting again.

## 2013-09-01 NOTE — ED Provider Notes (Signed)
CSN: [id number]     Arrival date & time 09/01/13  0435 History   First MD Initiated Contact with Patient 09/01/13 0503     Chief Complaint  Patient presents with  . Allergic Reaction     (Consider location/radiation/quality/duration/timing/severity/associated sxs/prior Treatment) Patient is a 38 y.o. male presenting with allergic reaction. The history is provided by the patient.  Allergic Reaction He has been treated for an abscess on his right thigh. He was seen in urgent care on the incision and drainage and then for a recheck. He has been on antibiotics of imipenem-sulfamethoxazole and clindamycin. Yesterday afternoon, he noted some swelling of his face on the left side involving the lip and this seems to be getting worse. He is worried that he might have an allergy to one of the antibiotics. He also states that the abscess is not improved in spite of antibiotics. He denies fever, chills, sweats. Blood sugars have been reasonably well controlled.  Past Medical History  Diagnosis Date  . Hypertension   . Diabetes mellitus   . GERD (gastroesophageal reflux disease)    No past surgical history on file. No family history on file. History  Substance Use Topics  . Smoking status: Current Every Day Smoker  . Smokeless tobacco: Not on file  . Alcohol Use: Yes    Review of Systems  All other systems reviewed and are negative.     Allergies  Review of patient's allergies indicates no known allergies.  Home Medications   Current Outpatient Rx  Name  Route  Sig  Dispense  Refill  . azithromycin (ZITHROMAX) 250 MG tablet   Oral   Take 1 tablet (250 mg total) by mouth daily. Take first 2 tablets together, then 1 every day until finished.   6 tablet   0   . clindamycin (CLEOCIN) 300 MG capsule   Oral   Take 1 capsule (300 mg total) by mouth 4 (four) times daily. X 10 days   28 capsule   0   . glipiZIDE (GLUCOTROL) 10 MG tablet   Oral   Take 10 mg by mouth 2 (two) times  daily before a meal.         . HYDROcodone-acetaminophen (NORCO/VICODIN) 5-325 MG per tablet   Oral   Take 0.5 tablets by mouth at bedtime as needed (cough).   6 tablet   0   . HYDROcodone-acetaminophen (NORCO/VICODIN) 5-325 MG per tablet      1 to 2 tabs every 4 to 6 hours as needed for pain.   20 tablet   0   . ibuprofen (ADVIL,MOTRIN) 200 MG tablet   Oral   Take 800 mg by mouth every 6 (six) hours as needed. For pain         . ipratropium (ATROVENT) 0.06 % nasal spray   Each Nare   Place 2 sprays into both nostrils 4 (four) times daily.   15 mL   1   . metFORMIN (GLUCOPHAGE) 500 MG tablet   Oral   Take 1,000 mg by mouth 2 (two) times daily with a meal.         . mupirocin ointment (BACTROBAN) 2 %   Topical   Apply 1 application topically 3 (three) times daily.   22 g   0   . oxyCODONE-acetaminophen (ROXICET) 5-325 MG per tablet   Oral   Take 1 tablet by mouth every 4 (four) hours as needed for severe pain.   15 tablet   0   .  pantoprazole (PROTONIX) 40 MG tablet   Oral   Take 40 mg by mouth daily.         . quinapril (ACCUPRIL) 20 MG tablet   Oral   Take 20 mg by mouth daily.         . sitaGLIPtin (JANUVIA) 100 MG tablet   Oral   Take 100 mg by mouth daily.         Marland Kitchen sulfamethoxazole-trimethoprim (BACTRIM DS) 800-160 MG per tablet   Oral   Take 2 tablets by mouth 2 (two) times daily.   40 tablet   0    BP 139/83  Pulse 86  Resp 18  Ht 6\' 4"  (1.93 m)  Wt 255 lb (115.667 kg)  BMI 31.05 kg/m2  SpO2 100% Physical Exam  Nursing note and vitals reviewed.  38 year old male, resting comfortably and in no acute distress. Vital signs are normal. Oxygen saturation is 100%, which is normal. Head is normocephalic and atraumatic. PERRLA, EOMI. Oropharynx is clear. There is mild angioedema involving the left side of the upper lip and into the cheek. There is no edema of the tongue, sublingual tissue, soft palate, uvula, oropharynx. He has no  difficulty with secretions and phonation is normal. There is no stridor. Neck is nontender and supple without adenopathy or JVD. Back is nontender and there is no CVA tenderness. Lungs are clear without rales, wheezes, or rhonchi. Chest is nontender. Heart has regular rate and rhythm without murmur. Abdomen is soft, flat, nontender without masses or hepatosplenomegaly and peristalsis is normoactive. Extremities have no cyanosis or edema, full range of motion is present. Indurated areas present in the proximal right thigh anteriorly and medially. There is a scar from recent incision and drainage and there is no active drainage from the site. There is no inguinal adenopathy. It is moderately tender to palpation. Skin is warm and dry without rash. Neurologic: Mental status is normal, cranial nerves are intact, there are no motor or sensory deficits.  ED Course  Procedures (including critical care time) INCISION AND DRAINAGE Performed by: Dione Booze Consent: Verbal consent obtained. Risks and benefits: risks, benefits and alternatives were discussed Type: abscess  Body area: right thigh  Anesthesia: local infiltration  No incision was made - previous incision was opened.  Local anesthetic: lidocaine 2% without epinephrine  Anesthetic total: 3 ml  Complexity: complex Blunt dissection to break up loculations  Drainage: purulent  Drainage amount: small/moderate  Packing material: none  Patient tolerance: Patient tolerated the procedure well with no immediate complications.    Labs Review Results for orders placed during the hospital encounter of 09/01/13  CBC WITH DIFFERENTIAL      Result Value Ref Range   WBC 6.6  4.0 - 10.5 K/uL   RBC 4.74  4.22 - 5.81 MIL/uL   Hemoglobin 15.2  13.0 - 17.0 g/dL   HCT 16.1  09.6 - 04.5 %   MCV 89.5  78.0 - 100.0 fL   MCH 32.1  26.0 - 34.0 pg   MCHC 35.8  30.0 - 36.0 g/dL   RDW 40.9  81.1 - 91.4 %   Platelets 239  150 - 400 K/uL    Neutrophils Relative % 56  43 - 77 %   Neutro Abs 3.8  1.7 - 7.7 K/uL   Lymphocytes Relative 25  12 - 46 %   Lymphs Abs 1.6  0.7 - 4.0 K/uL   Monocytes Relative 16 (*) 3 - 12 %  Monocytes Absolute 1.0  0.1 - 1.0 K/uL   Eosinophils Relative 2  0 - 5 %   Eosinophils Absolute 0.2  0.0 - 0.7 K/uL   Basophils Relative 1  0 - 1 %   Basophils Absolute 0.0  0.0 - 0.1 K/uL  BASIC METABOLIC PANEL      Result Value Ref Range   Sodium 140  137 - 147 mEq/L   Potassium 4.0  3.7 - 5.3 mEq/L   Chloride 102  96 - 112 mEq/L   CO2 23  19 - 32 mEq/L   Glucose, Bld 100 (*) 70 - 99 mg/dL   BUN 13  6 - 23 mg/dL   Creatinine, Ser 1.61  0.50 - 1.35 mg/dL   Calcium 9.4  8.4 - 09.6 mg/dL   GFR calc non Af Amer 82 (*) >90 mL/min   GFR calc Af Amer >90  >90 mL/min  SEDIMENTATION RATE      Result Value Ref Range   Sed Rate 30 (*) 0 - 16 mm/hr   MDM   Final diagnoses:  Angioedema of lips  Abscess of right thigh    Mild edema involving the face. It is noted that he is on a ACE inhibitor which is the most likely culprit. No evidence of allergy to sulfa or clindamycin. Abscess is indurated but not fluctuant. Limited bedside ultrasound was done which shows a small pocket in the indurated area which presumably is pus. Images were saved electronically. The wound will be opened and explored to try and drain what pus is there. Old records are reviewed and he actually had a culture done when there is an initial incision and drainage and there was no growth.  Case is discussed with general surgery, and they agree to see him in two days at 2:15PM  Dione Booze, MD 09/01/13 [phone]

## 2013-09-03 ENCOUNTER — Encounter (INDEPENDENT_AMBULATORY_CARE_PROVIDER_SITE_OTHER): Payer: Self-pay | Admitting: General Surgery

## 2014-01-29 ENCOUNTER — Encounter (HOSPITAL_COMMUNITY): Payer: Self-pay | Admitting: Emergency Medicine

## 2014-01-29 ENCOUNTER — Emergency Department (INDEPENDENT_AMBULATORY_CARE_PROVIDER_SITE_OTHER)
Admission: EM | Admit: 2014-01-29 | Discharge: 2014-01-29 | Disposition: A | Discharge status: Nursing Home (Includes ICF) | Payer: Self-pay | Source: Home / Self Care | Attending: Family Medicine | Admitting: Family Medicine

## 2014-01-29 ENCOUNTER — Emergency Department (HOSPITAL_COMMUNITY): Payer: Self-pay

## 2014-01-29 ENCOUNTER — Emergency Department (HOSPITAL_COMMUNITY)
Admission: EM | Admit: 2014-01-29 | Discharge: 2014-01-29 | Disposition: A | Discharge status: Home / Self Care | Payer: Self-pay | Attending: Emergency Medicine | Admitting: Emergency Medicine

## 2014-01-29 DIAGNOSIS — Z794 Long term (current) use of insulin: Secondary | ICD-10-CM | POA: Insufficient documentation

## 2014-01-29 DIAGNOSIS — K219 Gastro-esophageal reflux disease without esophagitis: Secondary | ICD-10-CM | POA: Insufficient documentation

## 2014-01-29 DIAGNOSIS — R0789 Other chest pain: Secondary | ICD-10-CM | POA: Insufficient documentation

## 2014-01-29 DIAGNOSIS — E119 Type 2 diabetes mellitus without complications: Secondary | ICD-10-CM | POA: Insufficient documentation

## 2014-01-29 DIAGNOSIS — R079 Chest pain, unspecified: Secondary | ICD-10-CM | POA: Insufficient documentation

## 2014-01-29 DIAGNOSIS — I10 Essential (primary) hypertension: Secondary | ICD-10-CM | POA: Insufficient documentation

## 2014-01-29 DIAGNOSIS — F17200 Nicotine dependence, unspecified, uncomplicated: Secondary | ICD-10-CM | POA: Insufficient documentation

## 2014-01-29 DIAGNOSIS — M79652 Pain in left thigh: Secondary | ICD-10-CM

## 2014-01-29 DIAGNOSIS — Z79899 Other long term (current) drug therapy: Secondary | ICD-10-CM | POA: Insufficient documentation

## 2014-01-29 DIAGNOSIS — M25559 Pain in unspecified hip: Secondary | ICD-10-CM | POA: Insufficient documentation

## 2014-01-29 LAB — CBC
HCT: 44.7 % (ref 39.0–52.0)
Hemoglobin: 15.5 g/dL (ref 13.0–17.0)
MCH: 32.3 pg (ref 26.0–34.0)
MCHC: 34.7 g/dL (ref 30.0–36.0)
MCV: 93.1 fL (ref 78.0–100.0)
PLATELETS: 194 10*3/uL (ref 150–400)
RBC: 4.8 MIL/uL (ref 4.22–5.81)
RDW: 13.7 % (ref 11.5–15.5)
WBC: 4.7 10*3/uL (ref 4.0–10.5)

## 2014-01-29 LAB — I-STAT TROPONIN, ED: Troponin i, poc: 0 ng/mL (ref 0.00–0.08)

## 2014-01-29 LAB — BASIC METABOLIC PANEL
ANION GAP: 11 (ref 5–15)
BUN: 14 mg/dL (ref 6–23)
CHLORIDE: 101 meq/L (ref 96–112)
CO2: 25 meq/L (ref 19–32)
CREATININE: 1.02 mg/dL (ref 0.50–1.35)
Calcium: 8.7 mg/dL (ref 8.4–10.5)
GFR calc non Af Amer: >90 mL/min (ref >90)
Glucose, Bld: 189 mg/dL — ABNORMAL HIGH (ref 70–99)
POTASSIUM: 4.5 meq/L (ref 3.7–5.3)
SODIUM: 137 meq/L (ref 137–147)

## 2014-01-29 LAB — GLUCOSE, CAPILLARY: Glucose-Capillary: 185 mg/dL — ABNORMAL HIGH (ref 70–99)

## 2014-01-29 IMAGING — CR DG CHEST 2V
2 series · 2 of 2 positions shown · non-contrast
Comparison: None.

CLINICAL DATA: Intermittent left-sided chest pain for couple days.

EXAM:
CHEST  2 VIEW

[w chest pa]
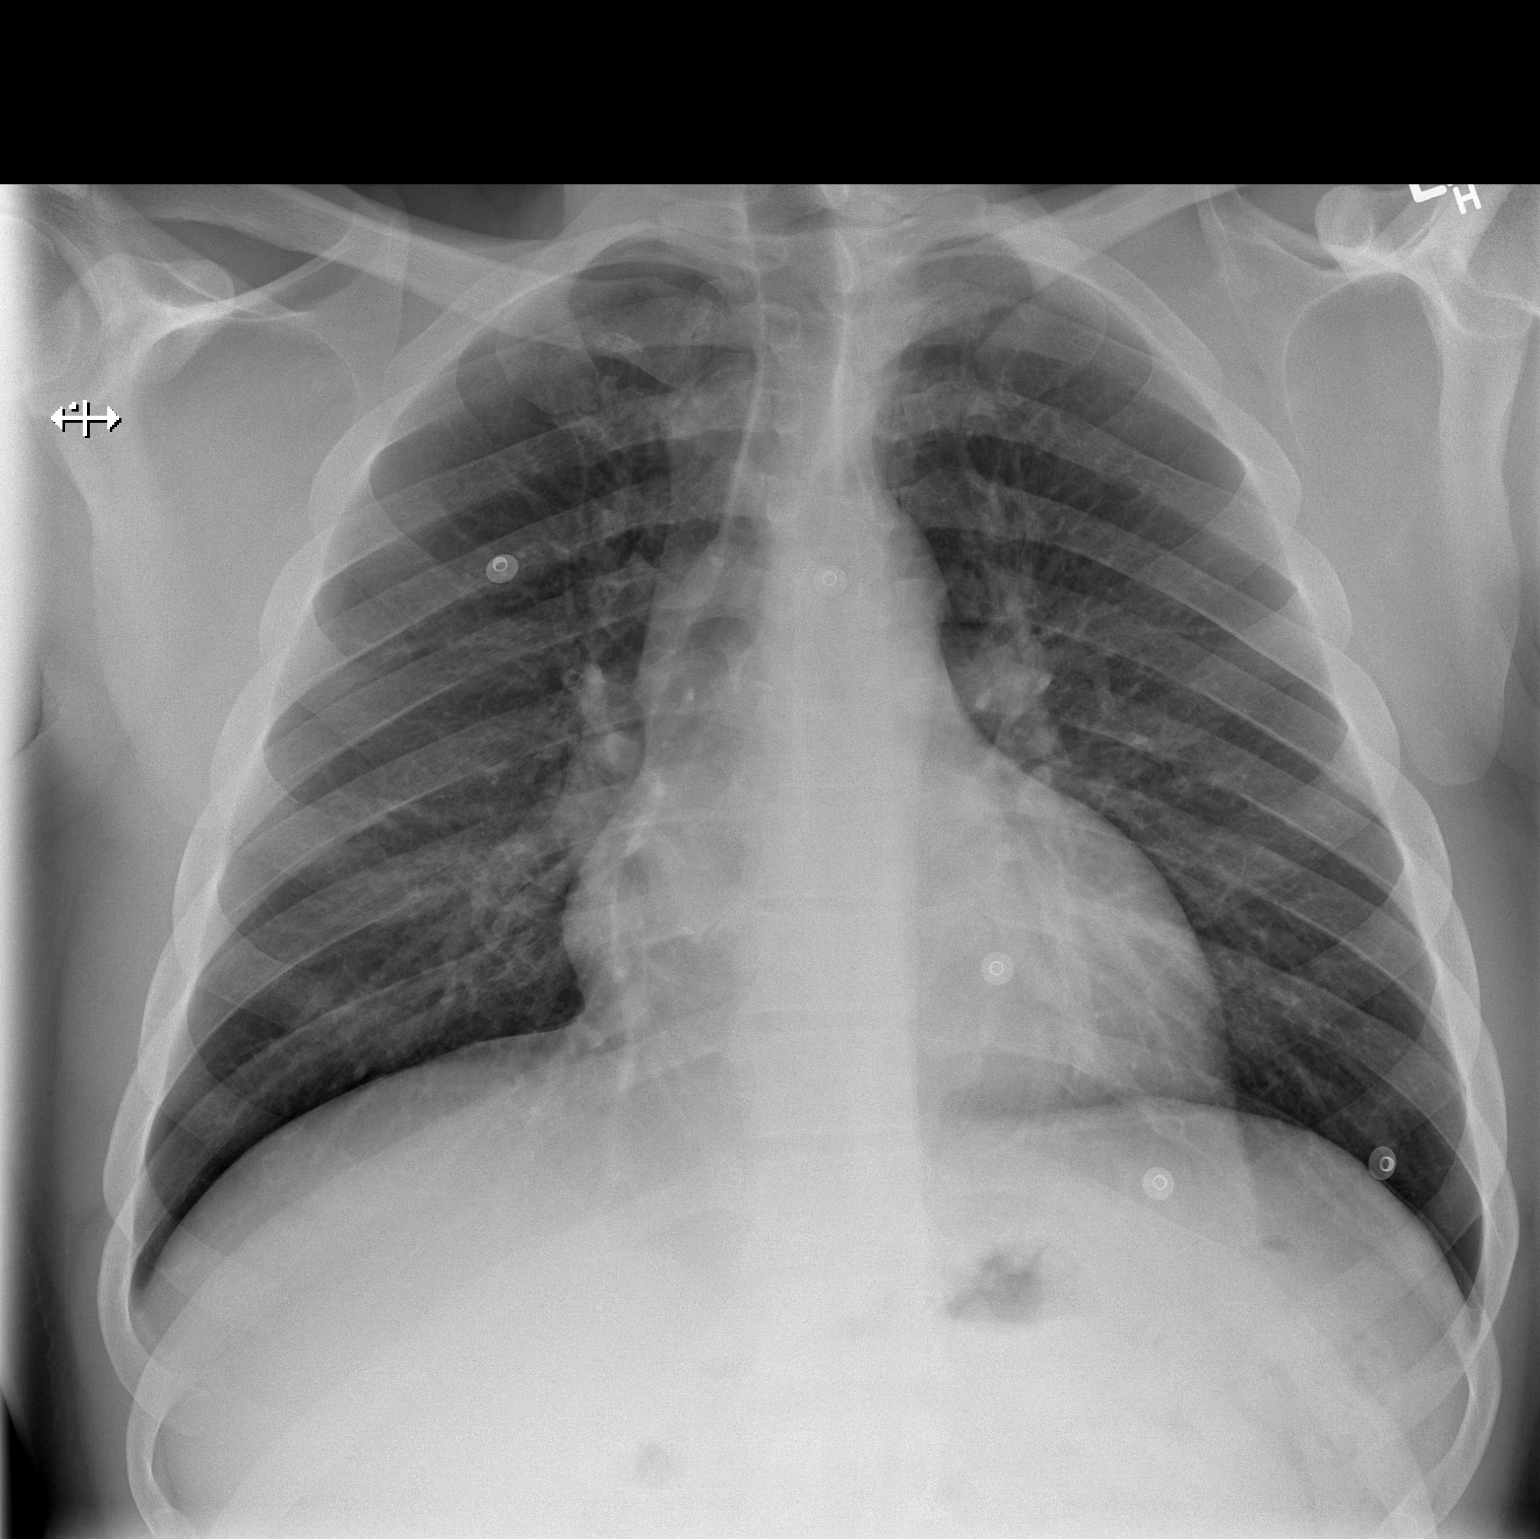

[w chest lat]
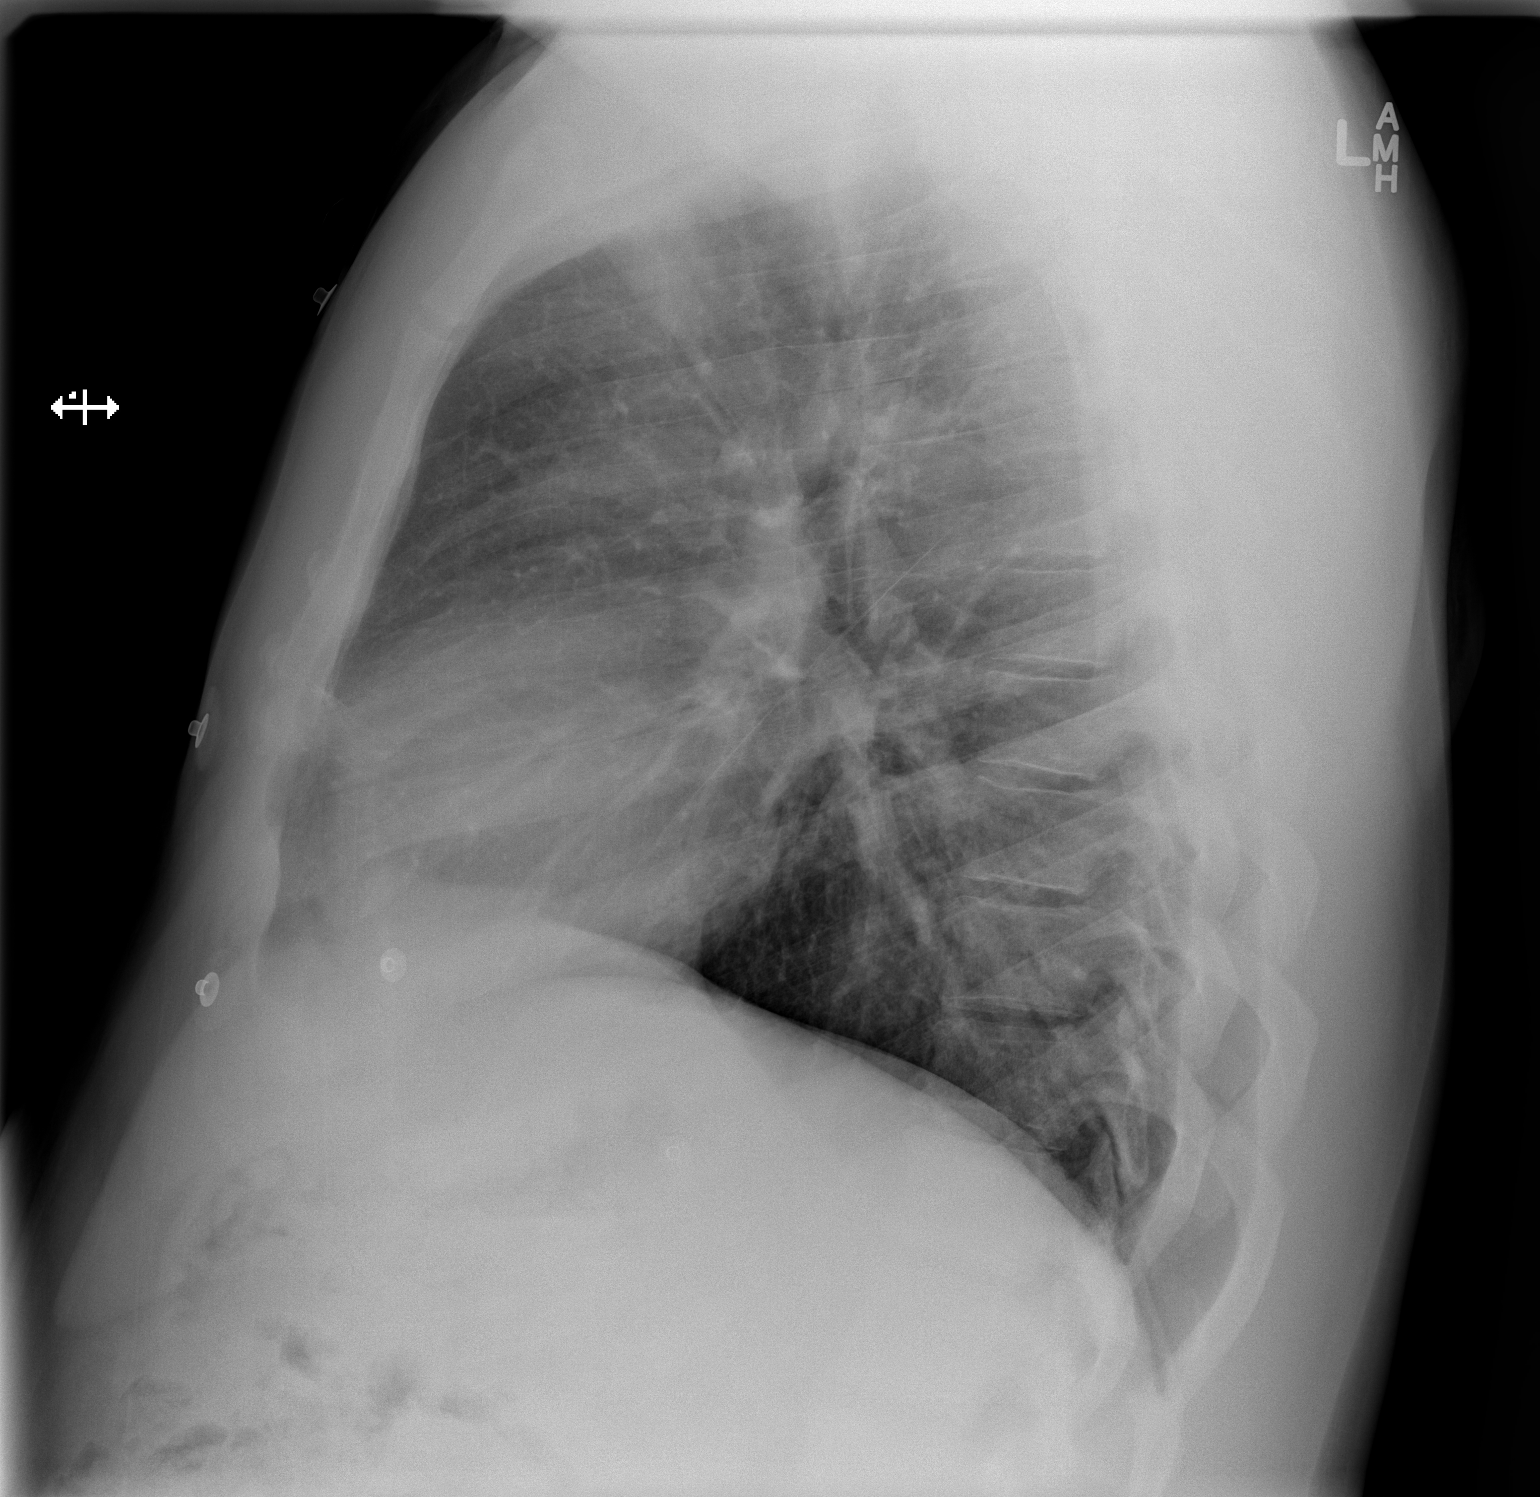

[2 of 2 positions shown; findings below may reference images not displayed]

FINDINGS: Cardiac silhouette is upper limits of normal in size. Lungs are well
inflated and clear. No pleural effusion or pneumothorax is seen. No
acute osseous abnormality is identified.
IMPRESSION: No active cardiopulmonary disease.

## 2014-01-29 IMAGING — CR DG HIP (WITH OR WITHOUT PELVIS) 2-3V*L*
3 series · 3 of 3 positions shown · non-contrast
Comparison: None.

CLINICAL DATA: Left leg pain radiating up into the left hip.

EXAM:
LEFT HIP - COMPLETE 2+ VIEW

[t pelvis ap]
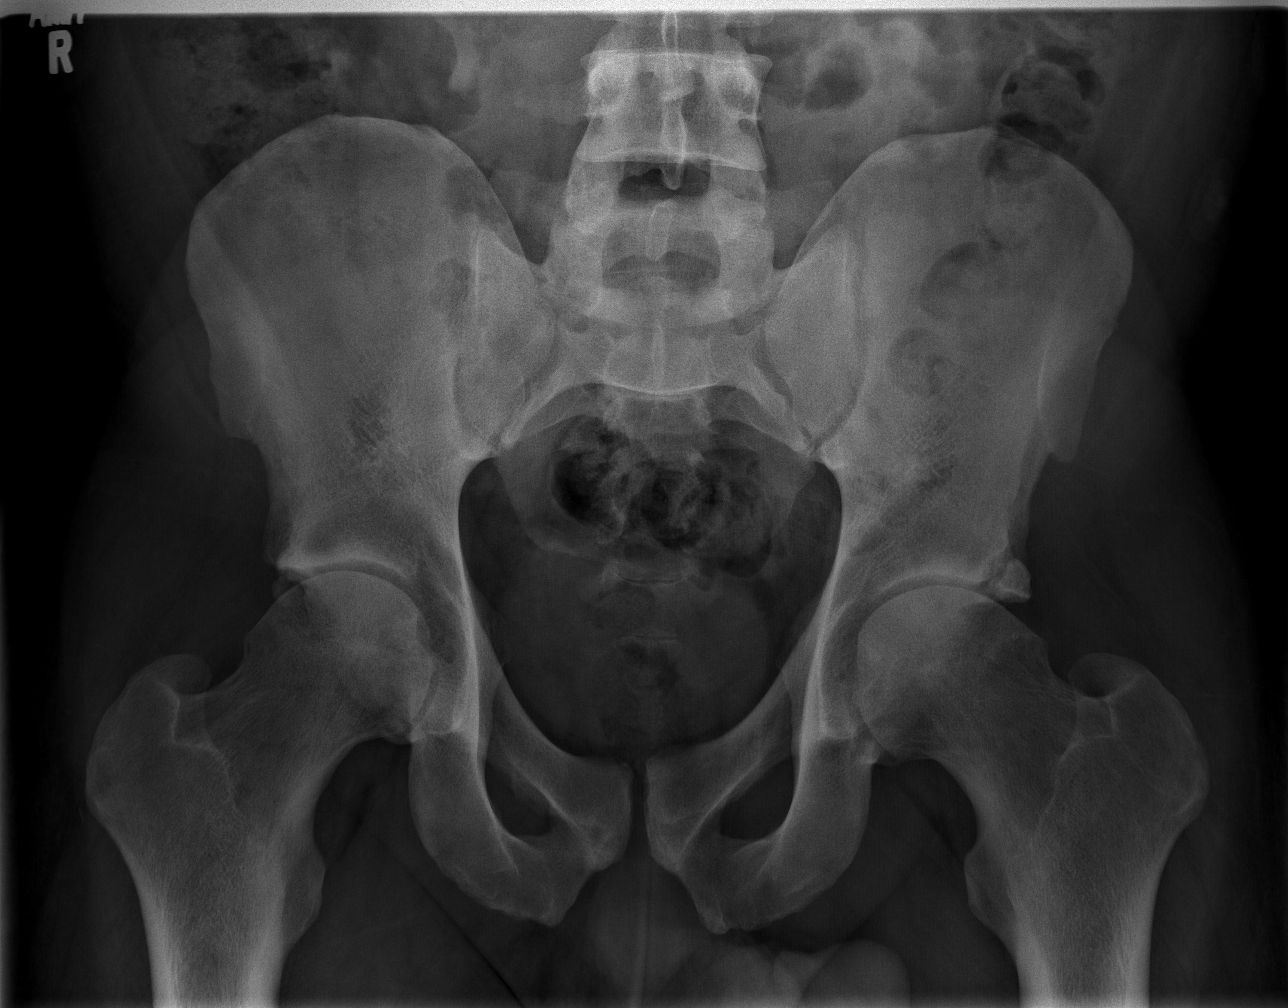

[t hip ap left]
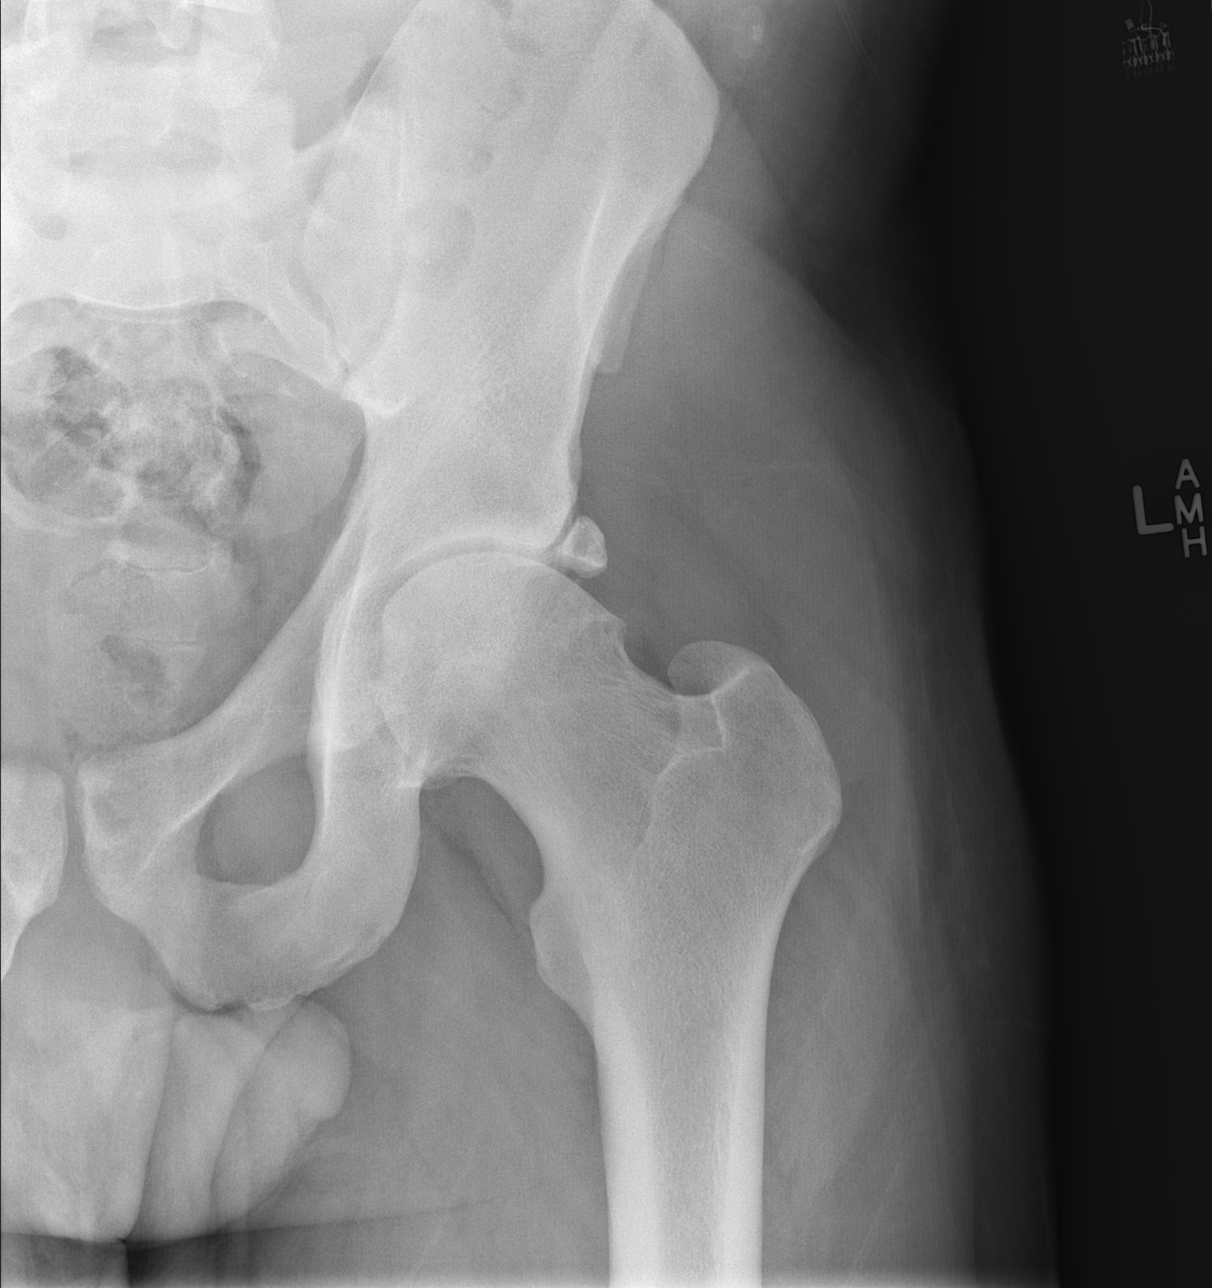

[t hip frog leg left]
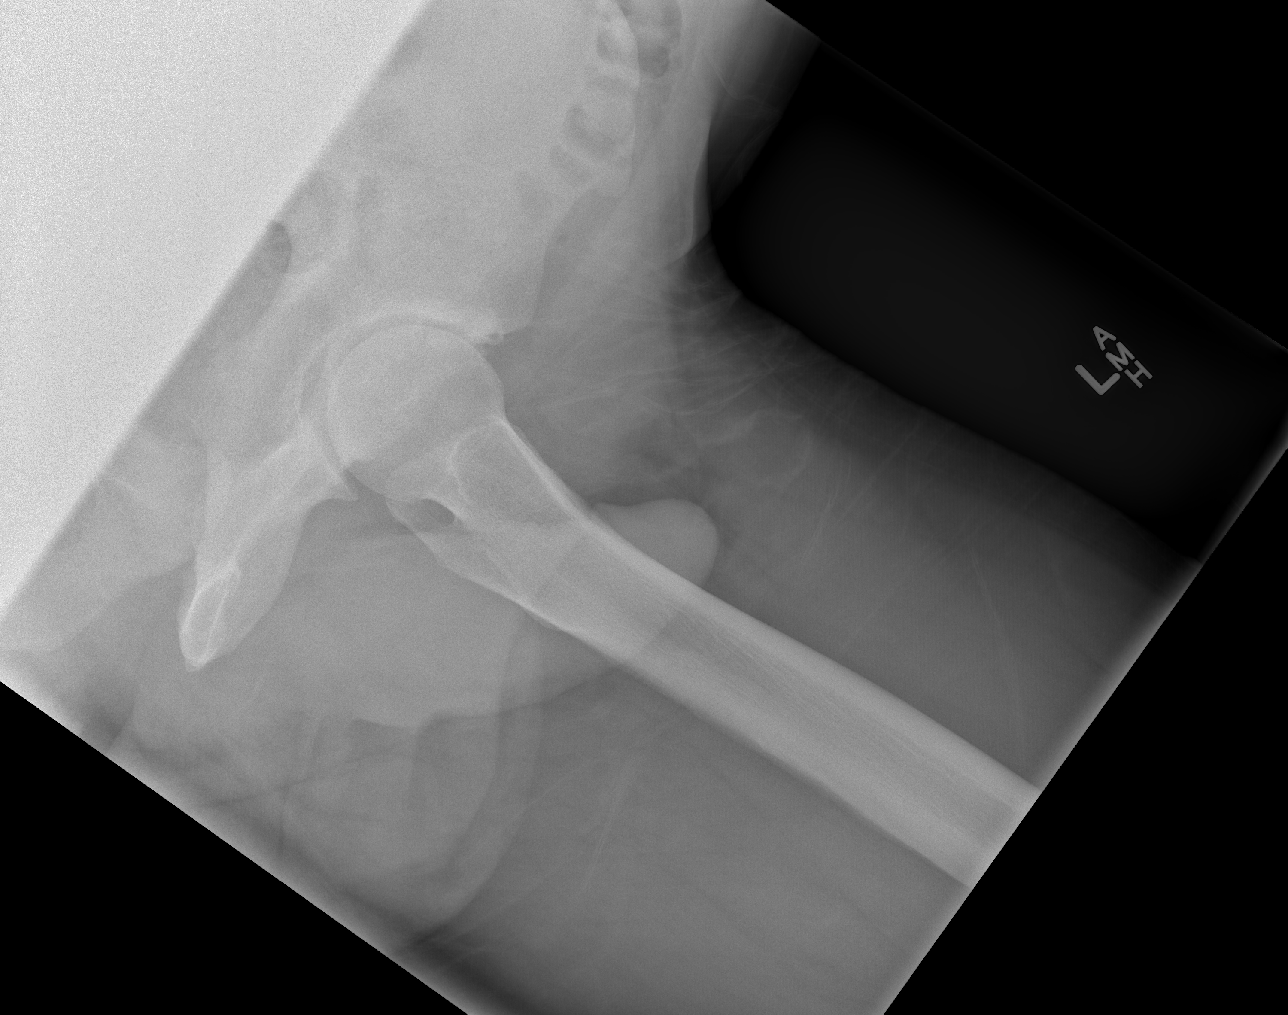

[3 of 3 positions shown; findings below may reference images not displayed]

FINDINGS: No acute fracture or dislocation is identified. There is a 1.4 cm
ossicle at the superolateral margin of the left acetabulum with
sclerotic margins. A slightly smaller ossicle is noted on the right.
There is mild bilateral hip joint space narrowing superiorly. No
lytic blastic osseous lesion is seen.
IMPRESSION: 1. No acute osseous abnormality.
2. 1.4 cm ossicle at the superolateral left acetabulum, which may be
developmental or reflect sequelae of prior trauma. A similar ossicle
is present on the right.
3. Mild bilateral hip joint space narrowing.

## 2014-01-29 MED ORDER — ASPIRIN 81 MG PO CHEW
324.0000 mg | CHEWABLE_TABLET | Freq: Once | ORAL | Status: AC
  Administered 2014-01-29: 324 mg via ORAL

## 2014-01-29 MED ORDER — SODIUM CHLORIDE 0.9 % IV SOLN
Freq: Once | INTRAVENOUS | Status: AC
  Administered 2014-01-29: 12:00:00 via INTRAVENOUS

## 2014-01-29 MED ORDER — ASPIRIN 81 MG PO CHEW
CHEWABLE_TABLET | ORAL | Status: AC
  Filled 2014-01-29: qty 4

## 2014-01-29 MED ORDER — HYDROCODONE-ACETAMINOPHEN 5-325 MG PO TABS: 1.0000 | ORAL_TABLET | ORAL | Status: DC | PRN

## 2014-01-29 NOTE — ED Notes (Signed)
Patient transported to X-ray 

## 2014-01-29 NOTE — ED Provider Notes (Signed)
CSN: [id number]     Arrival date & time 01/29/14  1008 History   First MD Initiated Contact with Patient 01/29/14 1044     Chief Complaint  Patient presents with  . Leg Pain  . Numbness   (Consider location/radiation/quality/duration/timing/severity/associated sxs/prior Treatment) HPI Comments: PCP: [facility] in [place], [place] Works in Aeronautical engineer  Patient is a 38 y.o. male presenting with chest pain. The history is provided by the patient.  Chest Pain Pain location:  L chest Pain quality: tightness   Pain radiates to:  L arm (reports numbness of left lateral lower lip) Pain severity:  Mild Onset quality:  Gradual Duration:  1 day Timing:  Intermittent Progression:  Waxing and waning Chronicity:  New Associated symptoms: dizziness, fatigue and numbness   Associated symptoms: no abdominal pain, no anorexia, no anxiety, no back pain, no claudication, no cough, no diaphoresis, no dysphagia, no fever, no headache, no heartburn, no lower extremity edema, no nausea, no near-syncope, no orthopnea, no palpitations, no PND, no shortness of breath, no syncope, not vomiting and no weakness   Risk factors: diabetes mellitus     Past Medical History  Diagnosis Date  . Hypertension   . Diabetes mellitus   . GERD (gastroesophageal reflux disease)    History reviewed. No pertinent past surgical history. History reviewed. No pertinent family history. History  Substance Use Topics  . Smoking status: Current Every Day Smoker  . Smokeless tobacco: Not on file  . Alcohol Use: Yes    Review of Systems  Constitutional: Positive for fatigue. Negative for fever and diaphoresis.  HENT: Negative for trouble swallowing.   Respiratory: Negative for cough and shortness of breath.   Cardiovascular: Positive for chest pain. Negative for palpitations, orthopnea, claudication, syncope, PND and near-syncope.  Gastrointestinal: Negative for heartburn, nausea, vomiting, abdominal pain and anorexia.   Musculoskeletal: Negative for back pain.  Neurological: Positive for dizziness and numbness. Negative for weakness and headaches.  All other systems reviewed and are negative.   Allergies  Review of patient's allergies indicates no known allergies.  Home Medications   Prior to Admission medications   Medication Sig Start Date End Date Taking? Authorizing Provider  esomeprazole (NEXIUM) 40 MG capsule Take 40 mg by mouth daily at 12 noon.   Yes Historical Provider, MD  insulin glargine (LANTUS) 100 UNIT/ML injection Inject 50 Units into the skin daily.   Yes Historical Provider, MD  sitaGLIPtin-metformin (JANUMET) 50-1000 MG per tablet Take 1 tablet by mouth daily.   Yes Historical Provider, MD  clindamycin (CLEOCIN) 300 MG capsule Take 1 capsule (300 mg total) by mouth 4 (four) times daily. X 10 days 08/31/13   Hayden Rasmussen, NP  HYDROcodone-acetaminophen (NORCO/VICODIN) 5-325 MG per tablet 1 to 2 tabs every 4 to 6 hours as needed for pain. 08/27/13   Reuben Likes, MD  ibuprofen (ADVIL,MOTRIN) 200 MG tablet Take 800 mg by mouth every 6 (six) hours as needed. For pain    Historical Provider, MD  mupirocin ointment (BACTROBAN) 2 % Apply 1 application topically 3 (three) times daily. 08/27/13   Reuben Likes, MD  oxyCODONE-acetaminophen (ROXICET) 5-325 MG per tablet Take 1 tablet by mouth every 4 (four) hours as needed for severe pain. 08/31/13   Hayden Rasmussen, NP  sulfamethoxazole-trimethoprim (BACTRIM DS) 800-160 MG per tablet Take 2 tablets by mouth 2 (two) times daily. 08/27/13   Reuben Likes, MD   BP 132/85  Pulse 87  Temp(Src) 98 F (36.7 C) (Oral)  Resp 16  SpO2 98% Physical Exam  Nursing note and vitals reviewed. Constitutional: He is oriented to person, place, and time. He appears well-developed and well-nourished. No distress.  HENT:  Head: Normocephalic and atraumatic.  Eyes: Conjunctivae are normal. No scleral icterus.  Cardiovascular: Normal rate, regular rhythm and normal heart  sounds.   Pulmonary/Chest: Effort normal and breath sounds normal. No respiratory distress. He has no wheezes.  Musculoskeletal: Normal range of motion. He exhibits no edema and no tenderness.  Neurological: He is alert and oriented to person, place, and time.  Skin: Skin is warm and dry. No rash noted. No erythema.  Psychiatric: He has a normal mood and affect. His behavior is normal.    ED Course  Procedures (including critical care time) Labs Review Labs Reviewed - No data to display  Imaging Review No results found.   MDM   1. Chest tightness    Intermittent chest tightness with associated dizziness and radiation to left arm in 38 y/o Type 1 diabetic AA male. Normotensive. Patient unaware of whether or not he has hyperlipidemia.  CBG= 189 EKG: NSR @ 70 bpm with nonspecific ST/ T wave changes.  While patient is chest pain free at time of today's visit and ECG is not suggestive of ACS, patient has significant risk for development of new onset angina. Will transfer to [facility] ER for further cardiac evaluation and risk stratification.     Ria Clock, [place] 01/29/14 1125

## 2014-01-29 NOTE — ED Notes (Signed)
Per [facility], pt from UC for left sided intermittent cp for the past 3 days. Also worried more about his left leg. Has been having sharp pains in his leg for the past 3 days. Denies any other symptoms and any pain at this time. Given 1 NTG by EMS and 324 mg ASA at UC. Pt was pain free when EMS arrived at [facility] and started having some cp when they got to the truck.

## 2014-01-29 NOTE — ED Notes (Signed)
Reports sharp pains radiating down both legs off/on.  Comes and goes.   Hx of diabetes.  Denies injury.    Also states "yesterday morning experienced numbness in the bottom lip left side.  Chest tightness and some numbness in left arm.

## 2014-01-29 NOTE — ED Provider Notes (Signed)
Medical screening examination/treatment/procedure(s) were performed by resident physician or non-physician practitioner and as supervising physician I was immediately available for consultation/collaboration.   Barkley Bruns MD.   Linna Hoff, MD 01/29/14 [phone]

## 2014-01-29 NOTE — Discharge Instructions (Signed)
Read the information below.  Use the prescribed medication as directed.  Please discuss all new medications with your pharmacist.  Do not take additional tylenol while taking the prescribed pain medication to avoid overdose.  You may return to the Emergency Department at any time for worsening condition or any new symptoms that concern you.  If you develop worsening chest pain, shortness of breath, fever, you pass out, or become weak or dizzy, return to the ER for a recheck.     Chest Pain (Nonspecific) It is often hard to give a specific diagnosis for the cause of chest pain. There is always a chance that your pain could be related to something serious, such as a heart attack or a blood clot in the lungs. You need to follow up with your health care provider for further evaluation. CAUSES   Heartburn.  Pneumonia or bronchitis.  Anxiety or stress.  Inflammation around your heart (pericarditis) or lung (pleuritis or pleurisy).  A blood clot in the lung.  A collapsed lung (pneumothorax). It can develop suddenly on its own (spontaneous pneumothorax) or from trauma to the chest.  Shingles infection (herpes zoster virus). The chest wall is composed of bones, muscles, and cartilage. Any of these can be the source of the pain.  The bones can be bruised by injury.  The muscles or cartilage can be strained by coughing or overwork.  The cartilage can be affected by inflammation and become sore (costochondritis). DIAGNOSIS  Lab tests or other studies may be needed to find the cause of your pain. Your health care provider may have you take a test called an ambulatory electrocardiogram (ECG). An ECG records your heartbeat patterns over a 24-hour period. You may also have other tests, such as:  Transthoracic echocardiogram (TTE). During echocardiography, sound waves are used to evaluate how blood flows through your heart.  Transesophageal echocardiogram (TEE).  Cardiac monitoring. This allows your  health care provider to monitor your heart rate and rhythm in real time.  Holter monitor. This is a portable device that records your heartbeat and can help diagnose heart arrhythmias. It allows your health care provider to track your heart activity for several days, if needed.  Stress tests by exercise or by giving medicine that makes the heart beat faster. TREATMENT   Treatment depends on what may be causing your chest pain. Treatment may include:  Acid blockers for heartburn.  Anti-inflammatory medicine.  Pain medicine for inflammatory conditions.  Antibiotics if an infection is present.  You may be advised to change lifestyle habits. This includes stopping smoking and avoiding alcohol, caffeine, and chocolate.  You may be advised to keep your head raised (elevated) when sleeping. This reduces the chance of acid going backward from your stomach into your esophagus. Most of the time, nonspecific chest pain will improve within 2-3 days with rest and mild pain medicine.  HOME CARE INSTRUCTIONS   If antibiotics were prescribed, take them as directed. Finish them even if you start to feel better.  For the next few days, avoid physical activities that bring on chest pain. Continue physical activities as directed.  Do not use any tobacco products, including cigarettes, chewing tobacco, or electronic cigarettes.  Avoid drinking alcohol.  Only take medicine as directed by your health care provider.  Follow your health care provider's suggestions for further testing if your chest pain does not go away.  Keep any follow-up appointments you made. If you do not go to an appointment, you could develop  lasting (chronic) problems with pain. If there is any problem keeping an appointment, call to reschedule. SEEK MEDICAL CARE IF:   Your chest pain does not go away, even after treatment.  You have a rash with blisters on your chest.  You have a fever. SEEK IMMEDIATE MEDICAL CARE IF:    You have increased chest pain or pain that spreads to your arm, neck, jaw, back, or abdomen.  You have shortness of breath.  You have an increasing cough, or you cough up blood.  You have severe back or abdominal pain.  You feel nauseous or vomit.  You have severe weakness.  You faint.  You have chills. This is an emergency. Do not wait to see if the pain will go away. Get medical help at once. Call your local emergency services (911 in [place] Do not drive yourself to the hospital. MAKE SURE YOU:   Understand these instructions.  Will watch your condition.  Will get help right away if you are not doing well or get worse. Document Released: 02/20/2005 Document Revised: 05/18/2013 Document Reviewed: 12/17/2007 [facility] Patient Information 2015 [place], [place]. This information is not intended to replace advice given to you by your health care provider. Make sure you discuss any questions you have with your health care provider.  Musculoskeletal Pain Musculoskeletal pain is muscle and boney aches and pains. These pains can occur in any part of the body. Your caregiver may treat you without knowing the cause of the pain. They may treat you if blood or urine tests, X-rays, and other tests were normal.  CAUSES There is often not a definite cause or reason for these pains. These pains may be caused by a type of germ (virus). The discomfort may also come from overuse. Overuse includes working out too hard when your body is not fit. Boney aches also come from weather changes. Bone is sensitive to atmospheric pressure changes. HOME CARE INSTRUCTIONS   Ask when your test results will be ready. Make sure you get your test results.  Only take over-the-counter or prescription medicines for pain, discomfort, or fever as directed by your caregiver. If you were given medications for your condition, do not drive, operate machinery or power tools, or sign legal documents for 24 hours. Do not drink  alcohol. Do not take sleeping pills or other medications that may interfere with treatment.  Continue all activities unless the activities cause more pain. When the pain lessens, slowly resume normal activities. Gradually increase the intensity and duration of the activities or exercise.  During periods of severe pain, bed rest may be helpful. Lay or sit in any position that is comfortable.  Putting ice on the injured area.  Put ice in a bag.  Place a towel between your skin and the bag.  Leave the ice on for 15 to 20 minutes, 3 to 4 times a day.  Follow up with your caregiver for continued problems and no reason can be found for the pain. If the pain becomes worse or does not go away, it may be necessary to repeat tests or do additional testing. Your caregiver may need to look further for a possible cause. SEEK IMMEDIATE MEDICAL CARE IF:  You have pain that is getting worse and is not relieved by medications.  You develop chest pain that is associated with shortness or breath, sweating, feeling sick to your stomach (nauseous), or throw up (vomit).  Your pain becomes localized to the abdomen.  You develop any new  symptoms that seem different or that concern you. MAKE SURE YOU:   Understand these instructions.  Will watch your condition.  Will get help right away if you are not doing well or get worse. Document Released: 05/13/2005 Document Revised: 08/05/2011 Document Reviewed: 01/15/2013 [facility] Patient Information 2015 [place], [place]. This information is not intended to replace advice given to you by your health care provider. Make sure you discuss any questions you have with your health care provider.

## 2014-01-29 NOTE — ED Provider Notes (Signed)
CSN: [id number]     Arrival date & time 01/29/14  1219 History   First MD Initiated Contact with Patient 01/29/14 1247     Chief Complaint  Patient presents with  . Chest Pain     (Consider location/radiation/quality/duration/timing/severity/associated sxs/prior Treatment) The history is provided by the patient and medical records.    Patient with DM, HTN sent from [facility] for chest pain.  Reports intermittent chest pain x 3 days, feels like tightness, lasts one second/fleeting, and is intermittent and random.  Has had intermittent left hand tingling in the same time period.  Denies fevers, recent illness, neck pain, SOB, nausea, lightheadedness/dizziness, diaphoresis.  Denies family hx early CAD.  Does have a brother who he thinks had a blood clot.   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  Also notes left thigh pain that has been ongoing for two months but much worse for the past three days.  Pain is throughout left thigh and now radiates down into his anterior proximal shin.  Better when up and walking around, worse with lying flat, particularly at night.  Denies injury, leg swelling.  No recent immobilization.  Has taken tylenol and aspirin without improvement.   Past Medical History  Diagnosis Date  . Hypertension   . Diabetes mellitus   . GERD (gastroesophageal reflux disease)    History reviewed. No pertinent past surgical history. No family history on file. History  Substance Use Topics  . Smoking status: Current Every Day Smoker  . Smokeless tobacco: Not on file  . Alcohol Use: Yes    Review of Systems  All other systems reviewed and are negative.     Allergies  Review of patient's allergies indicates no known allergies.  Home Medications   Prior to Admission medications   Medication Sig Start Date End Date Taking? Authorizing Provider  esomeprazole (NEXIUM) 40 MG capsule Take 40 mg  by mouth daily at 12 noon.   Yes Historical Provider, MD  insulin glargine (LANTUS) 100 UNIT/ML injection Inject 50 Units into the skin daily.   Yes Historical Provider, MD  QUINAPRIL HCL PO Take 1 tablet by mouth daily.   Yes Historical Provider, MD  sitaGLIPtin-metformin (JANUMET) 50-1000 MG per tablet Take 1 tablet by mouth daily.   Yes Historical Provider, MD   BP 112/71  Pulse 67  Temp(Src) 98.1 F (36.7 C) (Oral)  Resp 15  Ht  (1.93 m)  Wt 250 lb (113.399 kg)  BMI 30.44 kg/m2  SpO2 98% Physical Exam  Nursing note and vitals reviewed. Constitutional: He appears well-developed and well-nourished. No distress.  HENT:  Head: Normocephalic and atraumatic.  Neck: Neck supple.  Cardiovascular: Normal rate, regular rhythm and intact distal pulses.   Pulmonary/Chest: Effort normal and breath sounds normal. No respiratory distress. He has no wheezes. He has no rales.  Abdominal: Soft. He exhibits no distension and no mass. There is no tenderness. There is no rebound and no guarding.  Musculoskeletal: Normal range of motion. He exhibits no edema and no tenderness.  Neurological: He is alert. He has normal strength. No sensory deficit. He exhibits normal muscle tone.  Skin: He is not diaphoretic.    ED Course  Procedures (including critical care time) Labs Review Labs Reviewed  BASIC METABOLIC PANEL - Abnormal; Notable for the following:    Glucose, Bld 189 (*)    All other components within normal limits  CBC  I-STAT TROPOININ, ED    Imaging Review Dg Chest 2 View  01/29/2014   CLINICAL DATA:  Intermittent left-sided chest pain for couple days.  EXAM: CHEST  2 VIEW  COMPARISON:  None.  FINDINGS: Cardiac silhouette is upper limits of normal in size. Lungs are well inflated and clear. No pleural effusion or pneumothorax is seen. No acute osseous abnormality is identified.  IMPRESSION: No active cardiopulmonary disease.   Electronically Signed   By: Sebastian Ache   On: 01/29/2014  14:30   Dg Hip Complete Left  01/29/2014   CLINICAL DATA:  Left leg pain radiating up into the left hip.  EXAM: LEFT HIP - COMPLETE 2+ VIEW  COMPARISON:  None.  FINDINGS: No acute fracture or dislocation is identified. There is a 1.4 cm ossicle at the superolateral margin of the left acetabulum with sclerotic margins. A slightly smaller ossicle is noted on the right. There is mild bilateral hip joint space narrowing superiorly. No lytic blastic osseous lesion is seen.  IMPRESSION: 1. No acute osseous abnormality. 2. 1.4 cm ossicle at the superolateral left acetabulum, which may be developmental or reflect sequelae of prior trauma. A similar ossicle is present on the right. 3. Mild bilateral hip joint space narrowing.   Electronically Signed   By: Sebastian Ache   On: 01/29/2014 14:35     EKG Interpretation   Date/Time:  Saturday January 29 2014 12:23:53 EDT Ventricular Rate:  65 PR Interval:  164 QRS Duration: 100 QT Interval:  414 QTC Calculation: 430 R Axis:   45 Text Interpretation:  Sinus rhythm Minimal ST elevation, anterior leads No  significant change since last tracing Confirmed by HARRISON  MD, FORREST  ([id number]) on 01/29/2014 12:51:29 PM      MDM   Final diagnoses:  Atypical chest pain  Left thigh pain    Afebrile, nontoxic patient with atypical chest pain and ongoing left thigh pain.  Left leg pain has previously been evaluated by PCP with increased pain at night but no new injury or change in symptoms. Leg is nontender.  Labs, troponin, CXR, left hip xray unremarkable.  EKG abnormal but unchanged. Maximum HEART score 3, low risk. Wells PE score zero. PERC negative.  Left leg pain has been ongoing for months, no edema, erythema.  No clinical signs of DVT.  Pt also seen by Dr Romeo Apple.  Discussed pt with Dr Romeo Apple.  Pt is very low risk.  Doubt ACS.  Doubt PE.  Doubt DVT. Doubt septic hip.  D/C home with norco, PCP follow up.  Discussed result, findings, treatment, and follow up   with patient.  Pt given return precautions.  Pt verbalizes understanding and agrees with plan.        [place], PA-C 01/29/14 1552

## 2014-01-31 NOTE — ED Provider Notes (Signed)
Medical screening examination/treatment/procedure(s) were conducted as a shared visit with non-physician practitioner(s) and myself.  I personally evaluated the patient during the encounter.   EKG Interpretation   Date/Time:  Saturday January 29 2014 12:23:53 EDT Ventricular Rate:  65 PR Interval:  164 QRS Duration: 100 QT Interval:  414 QTC Calculation: 430 R Axis:   45 Text Interpretation:  Sinus rhythm Minimal ST elevation, anterior leads No  significant change since last tracing Confirmed by Kerie Badger  MD, Janika Jedlicka  ([id number]) on 01/29/2014 12:51:29 PM      I interviewed and examined the patient. Lungs are CTAB. Cardiac exam wnl. Abdomen soft.  No asymmetry in LE's, no focal ttp of LE's, 2+ distal pulses. Pt low risk for ACS/PE/DVT as noted in PA's note. He is well appearing. I interpreted/reviewed the labs and/or imaging which were non-contributory.  Will rec f/u w/ pcp.   Purvis Sheffield, MD 01/31/14 1010

## 2014-06-10 ENCOUNTER — Emergency Department (HOSPITAL_COMMUNITY)
Admission: EM | Admit: 2014-06-10 | Discharge: 2014-06-10 | Disposition: A | Discharge status: Home / Self Care | Payer: Medicaid Other | Source: Home / Self Care | Attending: Emergency Medicine | Admitting: Emergency Medicine

## 2014-06-10 ENCOUNTER — Encounter (HOSPITAL_COMMUNITY): Payer: Self-pay | Admitting: Emergency Medicine

## 2014-06-10 DIAGNOSIS — M549 Dorsalgia, unspecified: Secondary | ICD-10-CM

## 2014-06-10 DIAGNOSIS — M546 Pain in thoracic spine: Secondary | ICD-10-CM | POA: Diagnosis not present

## 2014-06-10 MED ORDER — IBUPROFEN 800 MG PO TABS: 800.0000 mg | ORAL_TABLET | Freq: Three times a day (TID) | ORAL | Status: DC | PRN

## 2014-06-10 MED ORDER — CYCLOBENZAPRINE HCL 10 MG PO TABS: 10.0000 mg | ORAL_TABLET | Freq: Every day | ORAL | Status: DC

## 2014-06-10 MED ORDER — VALACYCLOVIR HCL 1 G PO TABS: 1000.0000 mg | ORAL_TABLET | Freq: Three times a day (TID) | ORAL | Status: AC

## 2014-06-10 MED ORDER — CAPSAICIN-MENTHOL-METHYL SAL 0.025-1-12 % EX CREA: TOPICAL_CREAM | CUTANEOUS | Status: DC

## 2014-06-10 NOTE — ED Notes (Signed)
Reports back pain underneath right shoulder blade and burning pain to skin.  Onset 3 days ago.

## 2014-06-10 NOTE — Discharge Instructions (Signed)
You likely pulled a muscle in your back. Take ibuprofen 800mg  3 times a day as needed. Use flexeril as needed at bedtime.  This will make you tired. You can try over the counter hydrocortisone cream or the capsaicin cream.  If you develop little blister along the area of pain, please fill the Valtrex prescription for Shingles. Follow up as needed.

## 2014-06-10 NOTE — ED Provider Notes (Signed)
CSN: [id number]     Arrival date & time 06/10/14  1036 History   First MD Initiated Contact with Patient 06/10/14 1103     Chief Complaint  Patient presents with  . Back Pain   (Consider location/radiation/quality/duration/timing/severity/associated sxs/prior Treatment) HPI He is a 39 year old man here for evaluation of right upper back pain and skin irritation. His symptoms started about 3 days ago. He reports discomfort along the medial and inferior edge of his right shoulder blade. He also reports some skin discomfort in the posterior upper arm. The skin discomfort is described as burning. He has not noticed any rash. He denies any chest pain or shortness of breath. The back pain is worse with certain movements. He has not been taking anything.  Past Medical History  Diagnosis Date  . Hypertension   . Diabetes mellitus   . GERD (gastroesophageal reflux disease)    History reviewed. No pertinent past surgical history. No family history on file. History  Substance Use Topics  . Smoking status: Current Every Day Smoker  . Smokeless tobacco: Not on file  . Alcohol Use: Yes    Review of Systems As in history of present illness Allergies  Review of patient's allergies indicates no known allergies.  Home Medications   Prior to Admission medications   Medication Sig Start Date End Date Taking? Authorizing Provider  Capsaicin-Menthol-Methyl Sal (CAPSAICIN-METHYL SAL-MENTHOL) 0.025-1-12 % CREA Apply to affected area 3 times a day as needed. 06/10/14   Charm Rings, MD  cyclobenzaprine (FLEXERIL) 10 MG tablet Take 1 tablet (10 mg total) by mouth at bedtime. 06/10/14   Charm Rings, MD  esomeprazole (NEXIUM) 40 MG capsule Take 40 mg by mouth daily at 12 noon.    Historical Provider, MD  HYDROcodone-acetaminophen (NORCO/VICODIN) 5-325 MG per tablet Take 1 tablet by mouth every 4 (four) hours as needed for moderate pain or severe pain. 01/29/14   Trixie Dredge, PA-C  ibuprofen (ADVIL,MOTRIN) 800  MG tablet Take 1 tablet (800 mg total) by mouth every 8 (eight) hours as needed for moderate pain. 06/10/14   Charm Rings, MD  insulin glargine (LANTUS) 100 UNIT/ML injection Inject 50 Units into the skin daily.    Historical Provider, MD  QUINAPRIL HCL PO Take 1 tablet by mouth daily.    Historical Provider, MD  sitaGLIPtin-metformin (JANUMET) 50-1000 MG per tablet Take 1 tablet by mouth daily.    Historical Provider, MD  valACYclovir (VALTREX) 1000 MG tablet Take 1 tablet (1,000 mg total) by mouth 3 (three) times daily. 06/10/14 06/24/14  Charm Rings, MD   BP 138/88 mmHg  Pulse 82  Temp(Src) 98.9 F (37.2 C) (Oral)  Resp 16  SpO2 100% Physical Exam  Constitutional: He is oriented to person, place, and time. He appears well-developed and well-nourished. No distress.  Neck: Neck supple.  Cardiovascular: Normal rate, regular rhythm and normal heart sounds.   No murmur heard. Pulmonary/Chest: Effort normal.  Musculoskeletal:       Back:  Back: no vertebral tenderness or swelling.  No point tenderness.  Neurological: He is alert and oriented to person, place, and time.  Skin:  No rashes or lesions.  Very faint erythema along distribution of pain in back.    ED Course  Procedures (including critical care time) Labs Review Labs Reviewed - No data to display  Imaging Review No results found.   MDM   1. Upper back pain on right side    Likely musculoskeletal injury.  Treat conservatively with ice/heat, ibuprofen, Flexeril. He can also try over-the-counter hydrocortisone cream or capsaicin cream. Another possibility is shingles given report of burning pain. Prescription for Valtrex given, to be filled if he develops a vesicular rash. Follow-up as needed if symptoms worsen.    Charm RingsErin J Honig, MD 06/10/14 1146

## 2015-05-05 ENCOUNTER — Encounter (HOSPITAL_COMMUNITY): Payer: Self-pay | Admitting: Emergency Medicine

## 2015-05-05 ENCOUNTER — Emergency Department (INDEPENDENT_AMBULATORY_CARE_PROVIDER_SITE_OTHER)
Admission: EM | Admit: 2015-05-05 | Discharge: 2015-05-05 | Disposition: A | Discharge status: Home / Self Care | Payer: Medicaid Other | Source: Home / Self Care

## 2015-05-05 DIAGNOSIS — J302 Other seasonal allergic rhinitis: Secondary | ICD-10-CM | POA: Diagnosis not present

## 2015-05-05 MED ORDER — ZOLPIDEM TARTRATE 5 MG PO TABS: 5.0000 mg | ORAL_TABLET | Freq: Every evening | ORAL | Status: DC | PRN

## 2015-05-05 MED ORDER — IPRATROPIUM BROMIDE 0.06 % NA SOLN: 2.0000 | Freq: Four times a day (QID) | NASAL | Status: DC

## 2015-05-05 MED ORDER — AZITHROMYCIN 250 MG PO TABS: ORAL_TABLET | ORAL | Status: DC

## 2015-05-05 NOTE — ED Notes (Signed)
Uri since Monday, overly tired sinus drainage, nausea.

## 2015-05-05 NOTE — Discharge Instructions (Signed)
Drink plenty of fluids as discussed, use medicine as prescribed, and mucinex or delsym for cough. Return or see your doctor if further problems °

## 2015-05-05 NOTE — ED Provider Notes (Signed)
CSN: [id number]     Arrival date & time 05/05/15  9562 History   None    Chief Complaint  Patient presents with  . URI   (Consider location/radiation/quality/duration/timing/severity/associated sxs/prior Treatment) Patient is a 39 y.o. male presenting with URI. The history is provided by the patient.  URI Presenting symptoms: congestion, facial pain and rhinorrhea   Presenting symptoms: no fever   Severity:  Moderate Duration:  5 days Progression:  Worsening Chronicity:  New Ineffective treatments:  OTC medications Associated symptoms: sinus pain   Risk factors: recent travel     Past Medical History  Diagnosis Date  . Hypertension   . Diabetes mellitus   . GERD (gastroesophageal reflux disease)    History reviewed. No pertinent past surgical history. No family history on file. Social History  Substance Use Topics  . Smoking status: Current Every Day Smoker  . Smokeless tobacco: None  . Alcohol Use: Yes    Review of Systems  Constitutional: Negative.  Negative for fever.  HENT: Positive for congestion, postnasal drip, rhinorrhea and sinus pressure. Negative for facial swelling.   Respiratory: Negative.   Cardiovascular: Negative.   Gastrointestinal: Negative.   Psychiatric/Behavioral: Positive for sleep disturbance.  All other systems reviewed and are negative.   Allergies  Review of patient's allergies indicates no known allergies.  Home Medications   Prior to Admission medications   Medication Sig Start Date End Date Taking? Authorizing Provider  Chlorphen-Phenyleph-ASA (ALKA-SELTZER PLUS COLD PO) Take by mouth.   Yes Historical Provider, MD  fluticasone (FLONASE) 50 MCG/ACT nasal spray Place into both nostrils daily.   Yes Historical Provider, MD  Liraglutide (VICTOZA [place]) Inject into the skin.   Yes Historical Provider, MD  azithromycin (ZITHROMAX Z-PAK) 250 MG tablet Take as directed on pack 05/05/15   Linna Hoff, MD  Capsaicin-Menthol-Methyl Sal  (CAPSAICIN-METHYL SAL-MENTHOL) 0.025-1-12 % CREA Apply to affected area 3 times a day as needed. 06/10/14   Charm Rings, MD  cyclobenzaprine (FLEXERIL) 10 MG tablet Take 1 tablet (10 mg total) by mouth at bedtime. 06/10/14   Charm Rings, MD  esomeprazole (NEXIUM) 40 MG capsule Take 40 mg by mouth daily at 12 noon.    Historical Provider, MD  HYDROcodone-acetaminophen (NORCO/VICODIN) 5-325 MG per tablet Take 1 tablet by mouth every 4 (four) hours as needed for moderate pain or severe pain. 01/29/14   Trixie Dredge, PA-C  ibuprofen (ADVIL,MOTRIN) 800 MG tablet Take 1 tablet (800 mg total) by mouth every 8 (eight) hours as needed for moderate pain. 06/10/14   Charm Rings, MD  insulin glargine (LANTUS) 100 UNIT/ML injection Inject 50 Units into the skin daily.    Historical Provider, MD  ipratropium (ATROVENT) 0.06 % nasal spray Place 2 sprays into both nostrils 4 (four) times daily. 05/05/15   Linna Hoff, MD  QUINAPRIL HCL PO Take 1 tablet by mouth daily.    Historical Provider, MD  sitaGLIPtin-metformin (JANUMET) 50-1000 MG per tablet Take 1 tablet by mouth daily.    Historical Provider, MD  zolpidem (AMBIEN) 5 MG tablet Take 1 tablet (5 mg total) by mouth at bedtime as needed for sleep. 05/05/15   Linna Hoff, MD   Meds Ordered and Administered this Visit  Medications - No data to display  BP 120/83 mmHg  Pulse 98  Temp(Src) 98.5 F (36.9 C) (Oral)  Resp 18  SpO2 97% No data found.   Physical Exam  Constitutional: He is oriented to person, place, and  time. He appears well-developed and well-nourished.  HENT:  Head: Normocephalic.  Right Ear: External ear normal.  Left Ear: External ear normal.  Nose: Nose normal.  Mouth/Throat: Oropharynx is clear and moist.  Eyes: Pupils are equal, round, and reactive to light.  Neck: Normal range of motion. Neck supple.  Cardiovascular: Normal rate, regular rhythm and normal heart sounds.   Pulmonary/Chest: Breath sounds normal.  Lymphadenopathy:     He has no cervical adenopathy.  Neurological: He is alert and oriented to person, place, and time.  Skin: Skin is warm and dry.  Nursing note and vitals reviewed.   ED Course  Procedures (including critical care time)  Labs Review Labs Reviewed - No data to display  Imaging Review No results found.   Visual Acuity Review  Right Eye Distance:   Left Eye Distance:   Bilateral Distance:    Right Eye Near:   Left Eye Near:    Bilateral Near:         MDM   1. Seasonal allergic rhinitis       Linna HoffJames D Catha Ontko, MD 05/05/15 1941

## 2015-07-15 ENCOUNTER — Emergency Department (INDEPENDENT_AMBULATORY_CARE_PROVIDER_SITE_OTHER)
Admission: EM | Admit: 2015-07-15 | Discharge: 2015-07-15 | Disposition: A | Discharge status: Home / Self Care | Payer: Medicaid Other | Source: Home / Self Care | Attending: Emergency Medicine | Admitting: Emergency Medicine

## 2015-07-15 ENCOUNTER — Encounter (HOSPITAL_COMMUNITY): Payer: Self-pay | Admitting: Emergency Medicine

## 2015-07-15 DIAGNOSIS — S86012A Strain of left Achilles tendon, initial encounter: Secondary | ICD-10-CM | POA: Diagnosis not present

## 2015-07-15 MED ORDER — HYDROCODONE-ACETAMINOPHEN 5-325 MG PO TABS: 1.0000 | ORAL_TABLET | ORAL | Status: DC | PRN

## 2015-07-15 NOTE — ED Notes (Signed)
The patient presented to the [facility] with a complaint of left ankle pain secondary to playing basketball.

## 2015-07-15 NOTE — ED Provider Notes (Signed)
CSN: [id number]     Arrival date & time 07/15/15  1303 History   First MD Initiated Contact with Patient 07/15/15 1348     Chief Complaint  Patient presents with  . Ankle Pain   (Consider location/radiation/quality/duration/timing/severity/associated sxs/prior Treatment) HPI  He is a 40 year old man here for evaluation of left ankle injury. He states he was playing basketball tournament this afternoon when he came down and it felt like something popped in his left ankle. He reports pain in the posterior ankle and up into the calf.  He is able to bear weight on it, but cannot roll up onto his toe to walk normally. He applied ice immediately. He denies any significant swelling. He is a diabetic, but states his sugars have been pretty good.  Past Medical History  Diagnosis Date  . Hypertension   . Diabetes mellitus   . GERD (gastroesophageal reflux disease)    History reviewed. No pertinent past surgical history. History reviewed. No pertinent family history. Social History  Substance Use Topics  . Smoking status: Current Every Day Smoker  . Smokeless tobacco: None  . Alcohol Use: Yes    Review of Systems As in history of present illness Allergies  Review of patient's allergies indicates no known allergies.  Home Medications   Prior to Admission medications   Medication Sig Start Date End Date Taking? Authorizing Provider  esomeprazole (NEXIUM) 40 MG capsule Take 40 mg by mouth daily at 12 noon.   Yes Historical Provider, MD  insulin glargine (LANTUS) 100 UNIT/ML injection Inject 50 Units into the skin daily.   Yes Historical Provider, MD  Liraglutide (VICTOZA [place]) Inject into the skin.   Yes Historical Provider, MD  QUINAPRIL HCL PO Take 1 tablet by mouth daily.   Yes Historical Provider, MD  sitaGLIPtin-metformin (JANUMET) 50-1000 MG per tablet Take 1 tablet by mouth daily.   Yes Historical Provider, MD  azithromycin (ZITHROMAX Z-PAK) 250 MG tablet Take as directed on pack  05/05/15   Linna Hoff, MD  Capsaicin-Menthol-Methyl Sal (CAPSAICIN-METHYL SAL-MENTHOL) 0.025-1-12 % CREA Apply to affected area 3 times a day as needed. 06/10/14   Charm Rings, MD  Chlorphen-Phenyleph-ASA (ALKA-SELTZER PLUS COLD PO) Take by mouth.    Historical Provider, MD  cyclobenzaprine (FLEXERIL) 10 MG tablet Take 1 tablet (10 mg total) by mouth at bedtime. 06/10/14   Charm Rings, MD  fluticasone (FLONASE) 50 MCG/ACT nasal spray Place into both nostrils daily.    Historical Provider, MD  HYDROcodone-acetaminophen (NORCO) 5-325 MG tablet Take 1-2 tablets by mouth every 4 (four) hours as needed for moderate pain. 07/15/15   Charm Rings, MD  ibuprofen (ADVIL,MOTRIN) 800 MG tablet Take 1 tablet (800 mg total) by mouth every 8 (eight) hours as needed for moderate pain. 06/10/14   Charm Rings, MD  ipratropium (ATROVENT) 0.06 % nasal spray Place 2 sprays into both nostrils 4 (four) times daily. 05/05/15   Linna Hoff, MD  zolpidem (AMBIEN) 5 MG tablet Take 1 tablet (5 mg total) by mouth at bedtime as needed for sleep. 05/05/15   Linna Hoff, MD   Meds Ordered and Administered this Visit  Medications - No data to display  BP 132/92 mmHg  Pulse 97  Temp(Src) 98.2 F (36.8 C) (Oral)  SpO2 98% No data found.   Physical Exam  Constitutional: He is oriented to person, place, and time. He appears well-developed and well-nourished. No distress.  Cardiovascular: Normal rate.   Pulmonary/Chest: Effort  normal.  Musculoskeletal:  Left ankle: No erythema or edema. He has a palpable defect in the left Achilles tendon. He is tender in the posterior leg. No bony tenderness. He is unable to plantar flex his foot. 2+ DP pulse.  Neurological: He is alert and oriented to person, place, and time.    ED Course  Procedures (including critical care time)  Labs Review Labs Reviewed - No data to display  Imaging Review No results found.    MDM   1. Achilles rupture, left, initial encounter     Spoke with Dr. Magnus Ivan, orthopedic on-call. Will place in Cam Walker boot and have him follow-up at their office Monday. Prescription given for hydrocodone to use as needed for pain.    Charm Rings, MD 07/15/15 1425

## 2015-07-15 NOTE — Discharge Instructions (Signed)
You have a torn your Achilles tendon on the left. Please wear the boot until you see Dr. Magnus Ivan. You can remove the boot to shower. Take ibuprofen for pain. Use the hydrocodone every 4-6 hours as needed for severe pain. Do not drive while taking this medicine. Call Dr. Eliberto Ivory office Monday morning at 8:00 to set up an appointment. You will likely need surgery to fix this.

## 2015-07-17 ENCOUNTER — Other Ambulatory Visit: Payer: Self-pay | Admitting: Physician Assistant

## 2015-07-17 ENCOUNTER — Encounter (HOSPITAL_COMMUNITY): Payer: Self-pay | Admitting: *Deleted

## 2015-07-17 NOTE — Progress Notes (Signed)
Pt denies cardiac history, chest pain or sob. Pt is diabetic. States last A1C was high, he thought it was around 11. I called [facility] in [place] and they said it was 13.5 in December, 2016. Pt states he had trouble getting his Victoza for a while and that's what caused his blood sugar go up. He states his fasting blood sugar was running around 300 before getting back on Victoza. He hasn't checked it recently because he didn't have a meter, just got a new one but hasn't used it yet. I instructed pt to check his blood sugar a couple of times today and every 2 hours in the AM. I instructed him not to take Victoza tomorrow prior to surgery and to take 1/2 of his Lantus Insulin dose (35 units) in the AM. I also instructed him if his blood sugar should be 70 or below he needs to treat it with 1/2 cup (4 oz) of clear (apple or cranberry) and then check it in 15 min to see if it's going up. If it is still 70 or below, I instructed him to call Short Stay and ask for one of the nurses. Pt given phone #. Pt voiced understanding.

## 2015-07-18 ENCOUNTER — Ambulatory Visit (HOSPITAL_COMMUNITY): Payer: Medicaid Other | Admitting: Anesthesiology

## 2015-07-18 ENCOUNTER — Encounter (HOSPITAL_COMMUNITY)
Admission: RE | Disposition: A | Discharge status: Home / Self Care | Payer: Self-pay | Source: Ambulatory Visit | Attending: Orthopaedic Surgery

## 2015-07-18 ENCOUNTER — Observation Stay (HOSPITAL_COMMUNITY)
Admission: RE | Admit: 2015-07-18 | Discharge: 2015-07-19 | Disposition: A | Discharge status: Home / Self Care | Payer: Medicaid Other | Source: Ambulatory Visit | Attending: Orthopaedic Surgery | Admitting: Orthopaedic Surgery

## 2015-07-18 ENCOUNTER — Encounter (HOSPITAL_COMMUNITY): Payer: Self-pay

## 2015-07-18 DIAGNOSIS — E669 Obesity, unspecified: Secondary | ICD-10-CM | POA: Diagnosis not present

## 2015-07-18 DIAGNOSIS — I10 Essential (primary) hypertension: Secondary | ICD-10-CM | POA: Diagnosis not present

## 2015-07-18 DIAGNOSIS — K219 Gastro-esophageal reflux disease without esophagitis: Secondary | ICD-10-CM | POA: Diagnosis not present

## 2015-07-18 DIAGNOSIS — F17200 Nicotine dependence, unspecified, uncomplicated: Secondary | ICD-10-CM | POA: Insufficient documentation

## 2015-07-18 DIAGNOSIS — X58XXXA Exposure to other specified factors, initial encounter: Secondary | ICD-10-CM | POA: Insufficient documentation

## 2015-07-18 DIAGNOSIS — S86012A Strain of left Achilles tendon, initial encounter: Secondary | ICD-10-CM | POA: Diagnosis present

## 2015-07-18 DIAGNOSIS — Z6830 Body mass index (BMI) 30.0-30.9, adult: Secondary | ICD-10-CM | POA: Insufficient documentation

## 2015-07-18 DIAGNOSIS — E119 Type 2 diabetes mellitus without complications: Secondary | ICD-10-CM | POA: Diagnosis not present

## 2015-07-18 HISTORY — PX: ACHILLES TENDON SURGERY: SHX542

## 2015-07-18 LAB — BASIC METABOLIC PANEL
Anion gap: 11 (ref 5–15)
BUN: 16 mg/dL (ref 6–20)
CHLORIDE: 103 mmol/L (ref 101–111)
CO2: 25 mmol/L (ref 22–32)
CREATININE: 1.07 mg/dL (ref 0.61–1.24)
Calcium: 9.8 mg/dL (ref 8.9–10.3)
GFR calc Af Amer: >60 mL/min (ref >60)
GFR calc non Af Amer: >60 mL/min (ref >60)
Glucose, Bld: 120 mg/dL — ABNORMAL HIGH (ref 65–99)
Potassium: 4.6 mmol/L (ref 3.5–5.1)
Sodium: 139 mmol/L (ref 135–145)

## 2015-07-18 LAB — GLUCOSE, CAPILLARY
GLUCOSE-CAPILLARY: 123 mg/dL — AB (ref 65–99)
Glucose-Capillary: 127 mg/dL — ABNORMAL HIGH (ref 65–99)
Glucose-Capillary: 266 mg/dL — ABNORMAL HIGH (ref 65–99)

## 2015-07-18 LAB — CBC
HEMATOCRIT: 46.8 % (ref 39.0–52.0)
HEMOGLOBIN: 15.8 g/dL (ref 13.0–17.0)
MCH: 30 pg (ref 26.0–34.0)
MCHC: 33.8 g/dL (ref 30.0–36.0)
MCV: 89 fL (ref 78.0–100.0)
Platelets: 249 10*3/uL (ref 150–400)
RBC: 5.26 MIL/uL (ref 4.22–5.81)
RDW: 13.4 % (ref 11.5–15.5)
WBC: 5.4 10*3/uL (ref 4.0–10.5)

## 2015-07-18 SURGERY — REPAIR, TENDON, ACHILLES
Anesthesia: General | Laterality: Left

## 2015-07-18 MED ORDER — LACTATED RINGERS IV SOLN
INTRAVENOUS | Status: DC | PRN
  Administered 2015-07-18: 15:00:00 via INTRAVENOUS

## 2015-07-18 MED ORDER — METFORMIN HCL 500 MG PO TABS
1000.0000 mg | ORAL_TABLET | Freq: Every day | ORAL | Status: DC
  Administered 2015-07-19: 1000 mg via ORAL
  Filled 2015-07-18: qty 2

## 2015-07-18 MED ORDER — DIPHENHYDRAMINE HCL 12.5 MG/5ML PO ELIX: 12.5000 mg | ORAL_SOLUTION | ORAL | Status: DC | PRN

## 2015-07-18 MED ORDER — SUGAMMADEX SODIUM 500 MG/5ML IV SOLN
INTRAVENOUS | Status: DC | PRN
  Administered 2015-07-18: 226.8 mg via INTRAVENOUS

## 2015-07-18 MED ORDER — 0.9 % SODIUM CHLORIDE (POUR BTL) OPTIME
TOPICAL | Status: DC | PRN
  Administered 2015-07-18: 1000 mL

## 2015-07-18 MED ORDER — PHENYLEPHRINE 40 MCG/ML (10ML) SYRINGE FOR IV PUSH (FOR BLOOD PRESSURE SUPPORT)
PREFILLED_SYRINGE | INTRAVENOUS | Status: AC
  Filled 2015-07-18: qty 10

## 2015-07-18 MED ORDER — PRAVASTATIN SODIUM 40 MG PO TABS
40.0000 mg | ORAL_TABLET | Freq: Every evening | ORAL | Status: DC
  Administered 2015-07-18: 40 mg via ORAL
  Filled 2015-07-18: qty 1

## 2015-07-18 MED ORDER — ONDANSETRON HCL 4 MG/2ML IJ SOLN
INTRAMUSCULAR | Status: AC
  Filled 2015-07-18: qty 2

## 2015-07-18 MED ORDER — ACETAMINOPHEN 650 MG RE SUPP
650.0000 mg | Freq: Four times a day (QID) | RECTAL | Status: DC | PRN
Start: 2015-07-18 — End: 2015-07-19

## 2015-07-18 MED ORDER — FENTANYL CITRATE (PF) 100 MCG/2ML IJ SOLN
INTRAMUSCULAR | Status: DC | PRN
  Administered 2015-07-18: 100 ug via INTRAVENOUS
  Administered 2015-07-18 (×3): 50 ug via INTRAVENOUS
  Administered 2015-07-18: 100 ug via INTRAVENOUS

## 2015-07-18 MED ORDER — SUCCINYLCHOLINE CHLORIDE 20 MG/ML IJ SOLN
INTRAMUSCULAR | Status: DC | PRN
  Administered 2015-07-18: 120 mg via INTRAVENOUS

## 2015-07-18 MED ORDER — BUPIVACAINE-EPINEPHRINE (PF) 0.5% -1:200000 IJ SOLN
INTRAMUSCULAR | Status: DC | PRN
  Administered 2015-07-18: 30 mL via PERINEURAL

## 2015-07-18 MED ORDER — LACTATED RINGERS IV SOLN
INTRAVENOUS | Status: DC
  Administered 2015-07-18: 14:00:00 via INTRAVENOUS

## 2015-07-18 MED ORDER — HYDROMORPHONE HCL 1 MG/ML IJ SOLN
1.0000 mg | INTRAMUSCULAR | Status: DC | PRN
  Administered 2015-07-18: 1 mg via INTRAVENOUS
  Filled 2015-07-18: qty 1

## 2015-07-18 MED ORDER — METHOCARBAMOL 500 MG PO TABS
500.0000 mg | ORAL_TABLET | Freq: Four times a day (QID) | ORAL | Status: DC | PRN
  Administered 2015-07-19: 500 mg via ORAL
  Filled 2015-07-18 (×2): qty 1

## 2015-07-18 MED ORDER — HYDROCHLOROTHIAZIDE 12.5 MG PO CAPS
12.5000 mg | ORAL_CAPSULE | Freq: Every day | ORAL | Status: DC
  Administered 2015-07-19: 12.5 mg via ORAL
  Filled 2015-07-18: qty 1

## 2015-07-18 MED ORDER — DEXTROSE 5 % IV SOLN
3.0000 g | INTRAVENOUS | Status: AC
  Administered 2015-07-18: 3 g via INTRAVENOUS
  Filled 2015-07-18: qty 3000

## 2015-07-18 MED ORDER — LISINOPRIL-HYDROCHLOROTHIAZIDE 20-12.5 MG PO TABS: 1.0000 | ORAL_TABLET | Freq: Every day | ORAL | Status: DC

## 2015-07-18 MED ORDER — METOCLOPRAMIDE HCL 5 MG/ML IJ SOLN: 5.0000 mg | Freq: Three times a day (TID) | INTRAMUSCULAR | Status: DC | PRN

## 2015-07-18 MED ORDER — ACETAMINOPHEN 325 MG PO TABS: 650.0000 mg | ORAL_TABLET | Freq: Four times a day (QID) | ORAL | Status: DC | PRN

## 2015-07-18 MED ORDER — CHLORHEXIDINE GLUCONATE 4 % EX LIQD: 60.0000 mL | Freq: Once | CUTANEOUS | Status: DC

## 2015-07-18 MED ORDER — PANTOPRAZOLE SODIUM 40 MG PO TBEC
80.0000 mg | DELAYED_RELEASE_TABLET | Freq: Every day | ORAL | Status: DC
Start: 2015-07-19 — End: 2015-07-19
  Administered 2015-07-19: 80 mg via ORAL
  Filled 2015-07-18: qty 2

## 2015-07-18 MED ORDER — PHENYLEPHRINE HCL 10 MG/ML IJ SOLN
INTRAMUSCULAR | Status: DC | PRN
  Administered 2015-07-18: 80 ug via INTRAVENOUS

## 2015-07-18 MED ORDER — LIDOCAINE HCL (CARDIAC) 20 MG/ML IV SOLN
INTRAVENOUS | Status: DC | PRN
  Administered 2015-07-18: 100 mg via INTRAVENOUS

## 2015-07-18 MED ORDER — LINAGLIPTIN 5 MG PO TABS
5.0000 mg | ORAL_TABLET | Freq: Every day | ORAL | Status: DC
  Administered 2015-07-19: 5 mg via ORAL
  Filled 2015-07-18: qty 1

## 2015-07-18 MED ORDER — METOCLOPRAMIDE HCL 5 MG PO TABS: 5.0000 mg | ORAL_TABLET | Freq: Three times a day (TID) | ORAL | Status: DC | PRN

## 2015-07-18 MED ORDER — SODIUM CHLORIDE 0.9 % IV SOLN
INTRAVENOUS | Status: DC
  Administered 2015-07-18: 16:00:00 via INTRAVENOUS

## 2015-07-18 MED ORDER — FENTANYL CITRATE (PF) 250 MCG/5ML IJ SOLN
INTRAMUSCULAR | Status: AC
  Filled 2015-07-18: qty 5

## 2015-07-18 MED ORDER — ROCURONIUM BROMIDE 100 MG/10ML IV SOLN
INTRAVENOUS | Status: DC | PRN
  Administered 2015-07-18: 50 mg via INTRAVENOUS

## 2015-07-18 MED ORDER — CEFAZOLIN SODIUM 1-5 GM-% IV SOLN
1.0000 g | Freq: Four times a day (QID) | INTRAVENOUS | Status: AC
  Administered 2015-07-18 – 2015-07-19 (×3): 1 g via INTRAVENOUS
  Filled 2015-07-18 (×3): qty 50

## 2015-07-18 MED ORDER — ONDANSETRON HCL 4 MG/2ML IJ SOLN
INTRAMUSCULAR | Status: DC | PRN
  Administered 2015-07-18: 4 mg via INTRAVENOUS

## 2015-07-18 MED ORDER — MIDAZOLAM HCL 2 MG/2ML IJ SOLN
INTRAMUSCULAR | Status: AC
  Administered 2015-07-18: 2 mg
  Filled 2015-07-18: qty 2

## 2015-07-18 MED ORDER — ONDANSETRON HCL 4 MG/2ML IJ SOLN: 4.0000 mg | Freq: Four times a day (QID) | INTRAMUSCULAR | Status: DC | PRN

## 2015-07-18 MED ORDER — LIRAGLUTIDE 18 MG/3ML ~~LOC~~ SOPN: 1.8000 mg | PEN_INJECTOR | Freq: Every day | SUBCUTANEOUS | Status: DC

## 2015-07-18 MED ORDER — LISINOPRIL 20 MG PO TABS
20.0000 mg | ORAL_TABLET | Freq: Every day | ORAL | Status: DC
  Administered 2015-07-19: 20 mg via ORAL
  Filled 2015-07-18: qty 1

## 2015-07-18 MED ORDER — METHOCARBAMOL 1000 MG/10ML IJ SOLN
500.0000 mg | Freq: Four times a day (QID) | INTRAMUSCULAR | Status: DC | PRN
  Filled 2015-07-18: qty 5

## 2015-07-18 MED ORDER — ONDANSETRON HCL 4 MG PO TABS: 4.0000 mg | ORAL_TABLET | Freq: Four times a day (QID) | ORAL | Status: DC | PRN

## 2015-07-18 MED ORDER — ONDANSETRON HCL 4 MG/2ML IJ SOLN
4.0000 mg | Freq: Once | INTRAMUSCULAR | Status: AC | PRN
  Administered 2015-07-18: 4 mg via INTRAVENOUS

## 2015-07-18 MED ORDER — PROPOFOL 10 MG/ML IV BOLUS
INTRAVENOUS | Status: DC | PRN
  Administered 2015-07-18: 200 mg via INTRAVENOUS

## 2015-07-18 MED ORDER — PROPOFOL 10 MG/ML IV BOLUS
INTRAVENOUS | Status: AC
  Filled 2015-07-18: qty 20

## 2015-07-18 MED ORDER — FENTANYL CITRATE (PF) 100 MCG/2ML IJ SOLN
INTRAMUSCULAR | Status: AC
  Administered 2015-07-18: 100 ug
  Filled 2015-07-18: qty 2

## 2015-07-18 MED ORDER — SUGAMMADEX SODIUM 500 MG/5ML IV SOLN
INTRAVENOUS | Status: AC
  Filled 2015-07-18: qty 5

## 2015-07-18 MED ORDER — MIDAZOLAM HCL 5 MG/5ML IJ SOLN
INTRAMUSCULAR | Status: DC | PRN
  Administered 2015-07-18: 2 mg via INTRAVENOUS

## 2015-07-18 MED ORDER — INSULIN GLARGINE 100 UNIT/ML ~~LOC~~ SOLN
70.0000 [IU] | Freq: Every day | SUBCUTANEOUS | Status: DC
  Administered 2015-07-19: 70 [IU] via SUBCUTANEOUS
  Filled 2015-07-18: qty 0.7

## 2015-07-18 MED ORDER — SITAGLIPTIN-METFORMIN HCL ER 100-1000 MG PO TB24
1.0000 | ORAL_TABLET | Freq: Every day | ORAL | Status: DC
Start: 2015-07-18 — End: 2015-07-18

## 2015-07-18 MED ORDER — OXYCODONE HCL 5 MG PO TABS
5.0000 mg | ORAL_TABLET | ORAL | Status: DC | PRN
  Administered 2015-07-19 (×3): 10 mg via ORAL
  Filled 2015-07-18 (×3): qty 2

## 2015-07-18 MED ORDER — MEPERIDINE HCL 25 MG/ML IJ SOLN: 6.2500 mg | INTRAMUSCULAR | Status: DC | PRN

## 2015-07-18 MED ORDER — HYDROMORPHONE HCL 1 MG/ML IJ SOLN: 0.2500 mg | INTRAMUSCULAR | Status: DC | PRN

## 2015-07-18 MED ORDER — MIDAZOLAM HCL 2 MG/2ML IJ SOLN
INTRAMUSCULAR | Status: AC
  Filled 2015-07-18: qty 2

## 2015-07-18 SURGICAL SUPPLY — 57 items
BANDAGE ACE 4X5 VEL STRL LF (GAUZE/BANDAGES/DRESSINGS) ×2 IMPLANT
BANDAGE ELASTIC 6 VELCRO ST LF (GAUZE/BANDAGES/DRESSINGS) ×2 IMPLANT
BLADE SURG 10 STRL SS (BLADE) ×3 IMPLANT
BNDG CMPR 9X4 STRL LF SNTH (GAUZE/BANDAGES/DRESSINGS) ×1
BNDG COHESIVE 6X5 TAN STRL LF (GAUZE/BANDAGES/DRESSINGS) ×4 IMPLANT
BNDG ESMARK 4X9 LF (GAUZE/BANDAGES/DRESSINGS) ×2 IMPLANT
BNDG GAUZE STRTCH 6 (GAUZE/BANDAGES/DRESSINGS) ×1 IMPLANT
COTTON STERILE ROLL (GAUZE/BANDAGES/DRESSINGS) ×3 IMPLANT
CUFF TOURNIQUET SINGLE 34IN LL (TOURNIQUET CUFF) ×2 IMPLANT
CUFF TOURNIQUET SINGLE 44IN (TOURNIQUET CUFF) IMPLANT
DRAPE INCISE IOBAN 66X45 STRL (DRAPES) ×2 IMPLANT
DRAPE U-SHAPE 47X51 STRL (DRAPES) ×3 IMPLANT
DRSG ADAPTIC 3X8 NADH LF (GAUZE/BANDAGES/DRESSINGS) ×1 IMPLANT
DRSG PAD ABDOMINAL 8X10 ST (GAUZE/BANDAGES/DRESSINGS) ×2 IMPLANT
DURAPREP 26ML APPLICATOR (WOUND CARE) ×5 IMPLANT
ELECT REM PT RETURN 9FT ADLT (ELECTROSURGICAL) ×3
ELECTRODE REM PT RTRN 9FT ADLT (ELECTROSURGICAL) ×1 IMPLANT
GAUZE SPONGE 4X4 12PLY STRL (GAUZE/BANDAGES/DRESSINGS) ×3 IMPLANT
GAUZE XEROFORM 1X8 LF (GAUZE/BANDAGES/DRESSINGS) ×2 IMPLANT
GLOVE BIO SURGEON STRL SZ8 (GLOVE) ×3 IMPLANT
GLOVE BIOGEL PI IND STRL 8 (GLOVE) ×1 IMPLANT
GLOVE BIOGEL PI INDICATOR 8 (GLOVE) ×2
GLOVE ORTHO TXT STRL SZ7.5 (GLOVE) ×3 IMPLANT
GOWN STRL REUS W/ TWL LRG LVL3 (GOWN DISPOSABLE) ×2 IMPLANT
GOWN STRL REUS W/ TWL XL LVL3 (GOWN DISPOSABLE) ×4 IMPLANT
GOWN STRL REUS W/TWL LRG LVL3 (GOWN DISPOSABLE) ×6
GOWN STRL REUS W/TWL XL LVL3 (GOWN DISPOSABLE) ×12
KIT ROOM TURNOVER OR (KITS) ×3 IMPLANT
MANIFOLD NEPTUNE II (INSTRUMENTS) ×3 IMPLANT
NDL SUT .5 MAYO 1.404X.05X (NEEDLE) IMPLANT
NEEDLE MAYO TAPER (NEEDLE) ×3
NS IRRIG 1000ML POUR BTL (IV SOLUTION) ×3 IMPLANT
PACK ORTHO EXTREMITY (CUSTOM PROCEDURE TRAY) ×3 IMPLANT
PAD ARMBOARD 7.5X6 YLW CONV (MISCELLANEOUS) ×6 IMPLANT
PAD CAST 4YDX4 CTTN HI CHSV (CAST SUPPLIES) IMPLANT
PADDING CAST COTTON 4X4 STRL (CAST SUPPLIES) ×3
PADDING CAST COTTON 6X4 STRL (CAST SUPPLIES) ×2 IMPLANT
SPLINT PLASTER CAST XFAST 5X30 (CAST SUPPLIES) IMPLANT
SPLINT PLASTER XFAST SET 5X30 (CAST SUPPLIES) ×2
SPONGE LAP 4X18 X RAY DECT (DISPOSABLE) ×6 IMPLANT
SUT ETHILON 2 0 FS 18 (SUTURE) ×2 IMPLANT
SUT ETHILON 3 0 FSLX (SUTURE) ×1 IMPLANT
SUT FIBERWIRE #2 38 T-5 BLUE (SUTURE) ×9
SUT MNCRL AB 3-0 PS2 18 (SUTURE) ×3 IMPLANT
SUT VIC AB 0 CT1 27 (SUTURE) ×3
SUT VIC AB 0 CT1 27XBRD ANBCTR (SUTURE) IMPLANT
SUT VIC AB 1 CT1 27 (SUTURE) ×3
SUT VIC AB 1 CT1 27XBRD ANBCTR (SUTURE) IMPLANT
SUT VIC AB 2-0 CT1 27 (SUTURE) ×3
SUT VIC AB 2-0 CT1 TAPERPNT 27 (SUTURE) IMPLANT
SUTURE FIBERWR #2 38 T-5 BLUE (SUTURE) ×4 IMPLANT
TOWEL OR 17X24 6PK STRL BLUE (TOWEL DISPOSABLE) ×3 IMPLANT
TOWEL OR 17X26 10 PK STRL BLUE (TOWEL DISPOSABLE) ×3 IMPLANT
TUBE CONNECTING 12'X1/4 (SUCTIONS) ×1
TUBE CONNECTING 12X1/4 (SUCTIONS) ×2 IMPLANT
WATER STERILE IRR 1000ML POUR (IV SOLUTION) ×3 IMPLANT
YANKAUER SUCT BULB TIP NO VENT (SUCTIONS) ×3 IMPLANT

## 2015-07-18 NOTE — Anesthesia Postprocedure Evaluation (Signed)
Anesthesia Post Note  Patient: Christopher Abbott  Procedure(s) Performed: Procedure(s) (LRB): LEFT ACHILLES TENDON DIRECT PRIMARY REPAIR (Left)  Patient location during evaluation: PACU Anesthesia Type: Regional and General Level of consciousness: awake, awake and alert and oriented Pain management: pain level controlled Vital Signs Assessment: post-procedure vital signs reviewed and stable Respiratory status: spontaneous breathing, nonlabored ventilation, respiratory function stable and patient connected to nasal cannula oxygen Cardiovascular status: blood pressure returned to baseline Anesthetic complications: no    Last Vitals:  Filed Vitals:   07/18/15 1600 07/18/15 1615  BP: 119/63 108/69  Pulse: 81 82  Temp: 36.8 C   Resp: 17 14    Last Pain: There were no vitals filed for this visit.               [name] COKER

## 2015-07-18 NOTE — H&P (Signed)
Christopher Abbott is an 40 y.o. male.   Chief Complaint:   Left ankle pain with known achilles tendon rupture HPI:   40 yo male who sustained a complete rupture of his left achilles tendon while playing basketball this past weekend.  Was found to have a complete injury and surgery has been recommended for a direct primary repair.  Past Medical History  Diagnosis Date  . Hypertension   . GERD (gastroesophageal reflux disease)   . Diabetes mellitus     type 2    Past Surgical History  Procedure Laterality Date  . No past surgeries      Family History  Problem Relation Age of Onset  . Diabetes Mother   . Diabetes Father    Social History:  reports that he has been smoking Cigarettes.  He has been smoking about 0.50 packs per day. He has never used smokeless tobacco. He reports that he drinks alcohol. He reports that he does not use illicit drugs.  Allergies: No Known Allergies  No prescriptions prior to admission    No results found for this or any previous visit (from the past 48 hour(s)). No results found.  Review of Systems  All other systems reviewed and are negative.   There were no vitals taken for this visit. Physical Exam  Constitutional: He is oriented to person, place, and time. He appears well-developed and well-nourished.  HENT:  Head: Normocephalic and atraumatic.  Eyes: EOM are normal. Pupils are equal, round, and reactive to light.  Neck: Normal range of motion. Neck supple.  Cardiovascular: Normal rate and regular rhythm.   Respiratory: Effort normal and breath sounds normal.  GI: Soft. Bowel sounds are normal.  Musculoskeletal:       Left ankle: He exhibits swelling, ecchymosis and deformity. Tenderness. Achilles tendon exhibits abnormal Thompson's test results.  Neurological: He is alert and oriented to person, place, and time.  Skin: Skin is warm and dry.  Psychiatric: He has a normal mood and affect.     Assessment/Plan Acute complete left achilles  tendon rupture 1)  To the OR today for a direct primary repair of his ruptured left achilles tendon.  Risks and benefits have been discussed in detail including the risk of wound complications and infection given his diabetes and the area that is being operated on.  Kathryne Hitch, MD 07/18/2015, 12:13 PM

## 2015-07-18 NOTE — Transfer of Care (Signed)
Immediate Anesthesia Transfer of Care Note  Patient: Christopher Abbott  Procedure(s) Performed: Procedure(s): LEFT ACHILLES TENDON DIRECT PRIMARY REPAIR (Left)  Patient Location: PACU  Anesthesia Type:General  Level of Consciousness: awake  Airway & Oxygen Therapy: Patient Spontanous Breathing and Patient connected to nasal cannula oxygen  Post-op Assessment: Report given to RN and Post -op Vital signs reviewed and stable  Post vital signs: Reviewed and stable  Last Vitals:  Filed Vitals:   07/18/15 1422 07/18/15 1424  BP: 132/64 132/64  Pulse: 81 86  Temp:    Resp: 12 15    Complications: No apparent anesthesia complications

## 2015-07-18 NOTE — Op Note (Signed)
NAME:  Christopher Abbott, BAIK NO.:  [id number]  MEDICAL RECORD NO.:  [id number]  LOCATION:  [facility]                         FACILITY:  [facility]  PHYSICIAN:  Vanita Panda. Magnus Ivan, M.D.DATE OF BIRTH:  1976-05-10  DATE OF PROCEDURE:  07/18/2015 DATE OF DISCHARGE:                              OPERATIVE REPORT   PREOPERATIVE DIAGNOSIS:  Acute Achilles tendon rupture, left ankle.  POSTOPERATIVE DIAGNOSIS:  Acute Achilles tendon rupture, left ankle.  PROCEDURE:  Direct primary repair of ruptured left Achilles tendon.  SURGEON:  Vanita Panda. Magnus Ivan, MD  ASSISTANT:  Richardean Canal, PA-C  ANESTHESIA: 1. Regional. 2. General.  TOURNIQUET TIME:  Less than 30 minutes.  BLOOD LOSS:  Minimal.  COMPLICATIONS:  None.  ANTIBIOTICS:  3 g IV Ancef.  INDICATIONS:  Christopher Abbott is a 40 year old gentleman who on Saturday was doing jump shots, was playing basketball and he felt a pop and immediate pain in his left ankle.  He was diagnosed with Achilles tendon rupture and placed appropriately in a boot.  We saw him in the office.  He does have a positive Thompson test, swelling around the Achilles tendon and definitely weakness with a push-off indicative of full rupture of his Achilles tendon.  We talked about a direct primary repair because he is someone who works on his feet.  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  He also is a diabetic and he understands fully the higher risk of infection and wound complicating issues with Achilles tendon repairs, especially with someone whose diabetes is under not great control.  PROCEDURE DESCRIPTION:  After informed consent was obtained, appropriate left leg was marked.  Anesthesia obtained with regional anesthesia.  He was then brought to the operating room, and general anesthesia obtained while he was on a  stretcher.  A nonsterile tourniquet was placed around his upper thigh on the left side and then he was rolled into a prone position with appropriate padding throughout his body, his general areas, arms, and legs, as well as positioning of the head and neck.  His left lower leg and ankle and foot were prepped and draped with DuraPrep and sterile drapes.  A time-out was called.  He was identified as correct patient, correct left ankle.  We then made incision over the Achilles posteriorly the ankle and dissected down the tendon itself and opened the paratenon and found a large disrupted rupture of the distal Achilles tendon just proximal to its insertion.  Once we cleaned up the ends, however we placed #2 FiberWire sutures in both the distal stump and proximally and using a Krackow format, we then tied these down and oversewed this with #1 Vicryl suture.  We then tried to reapproximate as much of the paratenon as we can.  We closed the deep tissue with 0 Vicryl, followed by 2-0 Vicryl in subcutaneous tissue, interrupted 2-0 nylon on the skin.  Xeroform and well-padded sterile dressing was applied and we placed his foot in plantar flexed position in a well-padded plaster splint.  He was awakened, he was rolled back  into supine position, awakened, extubated and taken to recovery room in stable condition.  All final counts were correct.  There were no complications noted.  Richardean Canal, PA-C assisted in the entire case.  His assistance was crucial for facilitating this case.  Postoperatively, we are going to admit him for overnight, observation mainly for helping him get around with crutches and nonweightbearing on his left ankle as well as IV antibiotics for the next 2-3 doses due to his diabetes.     Vanita Panda. Magnus Ivan, M.D.     CYB/MEDQ  D:  07/18/2015  T:  07/18/2015  Job:  [id number]

## 2015-07-18 NOTE — Anesthesia Procedure Notes (Addendum)
Anesthesia Regional Block:  Popliteal block  Pre-Anesthetic Checklist: ,, timeout performed, Correct Patient, Correct Site, Correct Laterality, Correct Procedure, Correct Position, site marked, Risks and benefits discussed,  Surgical consent,  Pre-op evaluation,  At surgeon's request and post-op pain management  Laterality: Left  Prep: chloraprep       Needles:      Needle Length: 9cm 9 cm Needle Gauge: 21 and 21 G  Needle insertion depth: 8 cm   Additional Needles:  Procedures: ultrasound guided (picture in chart) Popliteal block Narrative:  Injection made incrementally with aspirations every 5 mL.  Performed by: Personally  Anesthesiologist: Mal Amabile  Additional Notes: Patient tolerated procedure well.    Procedure Name: Intubation Date/Time: 07/18/2015 3:50 PM Performed by: Gavin Pound, Sandor Arboleda J Pre-anesthesia Checklist: Patient identified, Emergency Drugs available, Suction available, Patient being monitored and Timeout performed Patient Re-evaluated:Patient Re-evaluated prior to inductionOxygen Delivery Method: Circle system utilized Preoxygenation: Pre-oxygenation with 100% oxygen Intubation Type: IV induction Ventilation: Mask ventilation without difficulty Laryngoscope Size: Mac and 4 Grade View: Grade I Tube type: Oral Tube size: 7.5 mm Number of attempts: 1 Placement Confirmation: ETT inserted through vocal cords under direct vision,  positive ETCO2 and breath sounds checked- equal and bilateral Secured at: 21 cm Tube secured with: Tape Dental Injury: Teeth and Oropharynx as per pre-operative assessment

## 2015-07-18 NOTE — Brief Op Note (Signed)
07/18/2015  3:28 PM  PATIENT:  Dakotah Boydstun  40 y.o. male  PRE-OPERATIVE DIAGNOSIS:  Left achilles tendon rupture  POST-OPERATIVE DIAGNOSIS:  Left achilles tendon rupture  PROCEDURE:  Procedure(s): LEFT ACHILLES TENDON DIRECT PRIMARY REPAIR (Left)  SURGEON:  Surgeon(s) and Role:    * Kathryne Hitch, MD - Primary  PHYSICIAN ASSISTANT: Rexene Edison, PA-C  ANESTHESIA:   regional and general  EBL:   minimal  COUNTS:  YES  TOURNIQUET:  Less than 30 minutes  DICTATION: .Other Dictation: Dictation Number [id number]  PLAN OF CARE: Admit for overnight observation  PATIENT DISPOSITION:  PACU - hemodynamically stable.   Delay start of Pharmacological VTE agent (>24hrs) due to surgical blood loss or risk of bleeding: no

## 2015-07-18 NOTE — Anesthesia Preprocedure Evaluation (Addendum)
Anesthesia Evaluation  Patient identified by MRN, date of birth, ID band Patient awake    Reviewed: Allergy & Precautions, NPO status , Patient's Chart, lab work & pertinent test results  Airway Mallampati: II  TM Distance: >3 FB Neck ROM: Full    Dental no notable dental hx. (+) Caps,    Pulmonary Current Smoker,    Pulmonary exam normal breath sounds clear to auscultation       Cardiovascular hypertension, Pt. on medications Normal cardiovascular exam Rhythm:Regular Rate:Normal     Neuro/Psych negative neurological ROS  negative psych ROS   GI/Hepatic Neg liver ROS, GERD  Controlled and Medicated,  Endo/Other  diabetes, Well Controlled, Type 2, Oral Hypoglycemic AgentsObesity  Renal/GU negative Renal ROS  negative genitourinary   Musculoskeletal Left Achilles tendon rupture   Abdominal (+) + obese,   Peds  Hematology negative hematology ROS (+)   Anesthesia Other Findings   Reproductive/Obstetrics                            Anesthesia Physical Anesthesia Plan  ASA: II  Anesthesia Plan: General   Post-op Pain Management: GA combined w/ Regional for post-op pain   Induction: Intravenous  Airway Management Planned: Oral ETT  Additional Equipment:   Intra-op Plan:   Post-operative Plan: Extubation in OR  Informed Consent: I have reviewed the patients History and Physical, chart, labs and discussed the procedure including the risks, benefits and alternatives for the proposed anesthesia with the patient or authorized representative who has indicated his/her understanding and acceptance.   Dental advisory given  Plan Discussed with: CRNA, Anesthesiologist and Surgeon  Anesthesia Plan Comments:       Anesthesia Quick Evaluation

## 2015-07-19 ENCOUNTER — Encounter (HOSPITAL_COMMUNITY): Payer: Self-pay | Admitting: Orthopaedic Surgery

## 2015-07-19 DIAGNOSIS — S86012A Strain of left Achilles tendon, initial encounter: Secondary | ICD-10-CM | POA: Diagnosis not present

## 2015-07-19 LAB — GLUCOSE, CAPILLARY
Glucose-Capillary: 133 mg/dL — ABNORMAL HIGH (ref 65–99)
Glucose-Capillary: 137 mg/dL — ABNORMAL HIGH (ref 65–99)

## 2015-07-19 LAB — HEMOGLOBIN A1C
Hgb A1c MFr Bld: 11.3 % — ABNORMAL HIGH (ref 4.8–5.6)
Mean Plasma Glucose: 278 mg/dL

## 2015-07-19 MED ORDER — OXYCODONE-ACETAMINOPHEN 5-325 MG PO TABS: 1.0000 | ORAL_TABLET | ORAL | Status: DC | PRN

## 2015-07-19 NOTE — Discharge Instructions (Signed)
Keep your splint clean and dry. No weight at all on your left leg. Bring your boot to your next outpatient ortho follow-up appointment.

## 2015-07-19 NOTE — Discharge Summary (Signed)
Patient ID: Christopher Abbott MRN: [id number] DOB/AGE: 38-Dec-1977 37 y.o.  Admit date: 07/18/2015 Discharge date: 07/19/2015  Admission Diagnoses:  Principal Problem:   Achilles rupture, left Active Problems:   Rupture of left Achilles tendon   Discharge Diagnoses:  Same  Past Medical History  Diagnosis Date  . Hypertension   . GERD (gastroesophageal reflux disease)   . Diabetes mellitus     type 2    Surgeries: Procedure(s): LEFT ACHILLES TENDON DIRECT PRIMARY REPAIR on 07/18/2015   Consultants:    Discharged Condition: Improved  Hospital Course: Elliott Lasecki is an 40 y.o. male who was admitted 07/18/2015 for operative treatment ofAchilles rupture, left. Patient has severe unremitting pain that affects sleep, daily activities, and work/hobbies. After pre-op clearance the patient was taken to the operating room on 07/18/2015 and underwent  Procedure(s): LEFT ACHILLES TENDON DIRECT PRIMARY REPAIR.    Patient was given perioperative antibiotics: Anti-infectives    Start     Dose/Rate Route Frequency Ordered Stop   07/18/15 2000  ceFAZolin (ANCEF) IVPB 1 g/50 mL premix     1 g 100 mL/hr over 30 Minutes Intravenous Every 6 hours 07/18/15 1744 07/19/15 1359   07/18/15 1400  ceFAZolin (ANCEF) 3 g in dextrose 5 % 50 mL IVPB     3 g 130 mL/hr over 30 Minutes Intravenous To ShortStay Surgical 07/18/15 0718 07/18/15 1444       Patient was given sequential compression devices, early ambulation, and chemoprophylaxis to prevent DVT.  Patient benefited maximally from hospital stay and there were no complications.    Recent vital signs: Patient Vitals for the past 24 hrs:  BP Temp Temp src Pulse Resp SpO2 Height Weight  07/19/15 0521 115/71 mmHg 98.6 F (37 C) - 70 17 98 % - -  07/18/15 1930 113/65 mmHg 98.1 F (36.7 C) Oral 73 16 98 % - -  07/18/15 1805 118/64 mmHg 98.6 F (37 C) - 76 15 99 % - -  07/18/15 1725 113/70 mmHg 98 F (36.7 C) - 69 12 95 % - -  07/18/15 1715  113/70 mmHg - - 69 12 99 % - -  07/18/15 1700 110/78 mmHg - - 76 12 95 % - -  07/18/15 1645 113/76 mmHg - - - - - - -  07/18/15 1630 116/79 mmHg - - 84 20 99 % - -  07/18/15 1615 108/69 mmHg - - 82 14 95 % - -  07/18/15 1600 119/63 mmHg 98.3 F (36.8 C) - 81 17 97 % - -  07/18/15 1555 130/74 mmHg - - - (!) 4 97 % - -  07/18/15 1554 - - - - 16 99 % - -  07/18/15 1553 - - - - 14 99 % - -  07/18/15 1552 111/69 mmHg - - - (!) 23 98 % - -  07/18/15 1551 - - - - (!) 7 99 % - -  07/18/15 1550 (!) 100/41 mmHg - - - (!) 9 99 % - -  07/18/15 1549 - - - - (!) 9 99 % - -  07/18/15 1548 (!) 95/33 mmHg - - - (!) 0 99 % - -  07/18/15 1547 - - - - (!) 9 98 % - -  07/18/15 1546 - - - - (!) 9 98 % - -  07/18/15 1545 (!) 88/46 mmHg - - - (!) 9 98 % - -  07/18/15 1544 - - - - (!) 9 98 % - -  07/18/15 1543 - - - - Marland Kitchen)  9 98 % - -  07/18/15 1542 (!) 88/46 mmHg - - - (!) 9 98 % - -  07/18/15 1541 - - - - (!) 9 98 % - -  07/18/15 1424 132/64 mmHg - - 86 15 98 % - -  07/18/15 1422 132/64 mmHg - - 81 12 98 % - -  07/18/15 1420 (!) 135/105 mmHg - - 89 16 100 % - -  07/18/15 1415 137/60 mmHg - - 85 (!) 21 99 % - -  07/18/15 1256 (!) 145/76 mmHg 98.4 F (36.9 C) Oral 87 (!) 0 98 %  (1.93 m) 113.399 kg (250 lb)     Recent laboratory studies:  Recent Labs  07/18/15 1334  WBC 5.4  HGB 15.8  HCT 46.8  PLT 249  NA 139  K 4.6  CL 103  CO2 25  BUN 16  CREATININE 1.07  GLUCOSE 120*  CALCIUM 9.8     Discharge Medications:     Medication List    STOP taking these medications        HYDROcodone-acetaminophen 5-325 MG tablet  Commonly known as:  NORCO      TAKE these medications        esomeprazole 40 MG capsule  Commonly known as:  NEXIUM  Take 40 mg by mouth daily at 12 noon.     ibuprofen 200 MG tablet  Commonly known as:  ADVIL,MOTRIN  Take 800 mg by mouth every 6 (six) hours as needed.     insulin glargine 100 UNIT/ML injection  Commonly known as:  LANTUS  Inject 70 Units into  the skin daily.     JANUMET XR [phone] MG Tb24  Generic drug:  SitaGLIPtin-MetFORMIN HCl  Take 1 tablet by mouth daily.     lisinopril-hydrochlorothiazide 20-12.5 MG tablet  Commonly known as:  PRINZIDE,ZESTORETIC  Take 1 tablet by mouth daily. for high blood pressure     oxyCODONE-acetaminophen 5-325 MG tablet  Commonly known as:  ROXICET  Take 1-2 tablets by mouth every 4 (four) hours as needed.     pravastatin 40 MG tablet  Commonly known as:  PRAVACHOL  Take 40 mg by mouth every evening. for cholesterol     VICTOZA 18 MG/3ML Sopn  Generic drug:  Liraglutide  Inject 1.8 mg into the skin daily.        Diagnostic Studies: No results found.  Disposition: 01-Home or Self Care      Discharge Instructions    Discharge patient    Complete by:  As directed            Follow-up Information    Follow up with Kathryne Hitch, MD In 2 weeks.   Specialty:  Orthopedic Surgery   Contact information:   [address] [place] [address] [phone]        Signed: Kathryne Hitch 07/19/2015, 7:11 AM

## 2015-07-19 NOTE — Progress Notes (Signed)
Subjective: 1 Day Post-Op Procedure(s) (LRB): LEFT ACHILLES TENDON DIRECT PRIMARY REPAIR (Left) Patient reports pain as moderate.    Objective: Vital signs in last 24 hours: Temp:  [98 F (36.7 C)-98.6 F (37 C)] 98.6 F (37 C) (02/22 0521) Pulse Rate:  [69-89] 70 (02/22 0521) Resp:  [0-23] 17 (02/22 0521) BP: (88-145)/(33-105) 115/71 mmHg (02/22 0521) SpO2:  [95 %-100 %] 98 % (02/22 0521) Weight:  [113.399 kg (250 lb)] 113.399 kg (250 lb) (02/21 1256)  Intake/Output from previous day: 02/21 0701 - 02/22 0700 In: 1265 [P.O.:240; I.V.:1025] Out: 25 [Blood:25] Intake/Output this shift:     Recent Labs  07/18/15 1334  HGB 15.8    Recent Labs  07/18/15 1334  WBC 5.4  RBC 5.26  HCT 46.8  PLT 249    Recent Labs  07/18/15 1334  NA 139  K 4.6  CL 103  CO2 25  BUN 16  CREATININE 1.07  GLUCOSE 120*  CALCIUM 9.8   No results for input(s): LABPT, INR in the last 72 hours.  Sensation intact distally Intact pulses distally Incision: dressing C/D/I Compartment soft  Assessment/Plan: 1 Day Post-Op Procedure(s) (LRB): LEFT ACHILLES TENDON DIRECT PRIMARY REPAIR (Left) Up with therapy - NWB left ankle Discharge to home today  Kathryne Hitch 07/19/2015, 7:08 AM

## 2015-07-19 NOTE — Progress Notes (Signed)
Orthopedic Tech Progress Note Patient Details:  Christopher Abbott [birth year] [id number]  Ortho Devices Type of Ortho Device: Crutches Ortho Device/Splint Interventions: Application   Nikki Dom 07/19/2015, 9:42 AM

## 2015-07-19 NOTE — Evaluation (Signed)
Physical Therapy Evaluation Patient Details Name: Manson Luckadoo MRN: [id number] DOB: [birth year] Today's Date: 07/19/2015   History of Present Illness  Admitted for  Achilles Tendon repair after rupture;  has a past medical history of Hypertension; GERD (gastroesophageal reflux disease); and Diabetes mellitus.  Clinical Impression  Patient evaluated by Physical Therapy with no further acute PT needs identified. All education has been completed and the patient has no further questions.  See below for any follow-up Physical Therapy or equipment needs. PT is signing off. Thank you for this referral.     Follow Up Recommendations Outpatient PT  The potential need for Outpatient PT can be addressed at Ortho follow-up appointments.     Equipment Recommendations  Crutches (delovered to room)    Recommendations for Other Services       Precautions / Restrictions Precautions Precautions: None Restrictions Weight Bearing Restrictions: Yes LLE Weight Bearing: Non weight bearing      Mobility  Bed Mobility Overal bed mobility: Independent                Transfers Overall transfer level: Independent Equipment used: None;Crutches             General transfer comment: Gave cues and instruction fro cruthc management; no difficulty noted  Ambulation/Gait Ambulation/Gait assistance: Supervision;Modified independent (Device/Increase time) Ambulation Distance (Feet): 80 Feet Assistive device: Crutches Gait Pattern/deviations: Step-through pattern Gait velocity: fast   General Gait Details: Cues for crutch fit and use; managing quite well with step-through  Stairs Stairs: Yes Stairs assistance: Supervision Stair Management: No rails;Step to pattern;Forwards;With crutches Number of Stairs: 2 General stair comments: Cues for gait sequence and technqiue; prvided handout  Wheelchair Mobility    Modified Rankin (Stroke Patients Only)       Balance                                              Pertinent Vitals/Pain Pain Assessment: No/denies pain    Home Living Family/patient expects to be discharged to:: Private residence Living Arrangements: Alone Available Help at Discharge: Family;Friend(s);Available PRN/intermittently Type of Home: House Home Access: Level entry     Home Layout: Two level Home Equipment: None      Prior Function Level of Independence: Independent               Hand Dominance        Extremity/Trunk Assessment   Upper Extremity Assessment: Overall WFL for tasks assessed           Lower Extremity Assessment: LLE deficits/detail   LLE Deficits / Details: Hip, knee WFL; Able to keep NWB without much difficulty; positive active toe wiggle     Communication   Communication: No difficulties  Cognition Arousal/Alertness: Awake/alert Behavior During Therapy: WFL for tasks assessed/performed Overall Cognitive Status: Within Functional Limits for tasks assessed                      General Comments General comments (skin integrity, edema, etc.): Discussed options for knee walker, which I think he would do well with    Exercises        Assessment/Plan    PT Assessment All further PT needs can be met in the next venue of care  PT Diagnosis Difficulty walking;Acute pain   PT Problem List Decreased strength;Decreased range of motion;Decreased activity tolerance  PT Treatment  Interventions     PT Goals (Current goals can be found in the Care Plan section) Acute Rehab PT Goals Patient Stated Goal: back to normal PT Goal Formulation: All assessment and education complete, DC therapy    Frequency     Barriers to discharge        Co-evaluation               End of Session   Activity Tolerance: Patient tolerated treatment well Patient left: in bed;with call bell/phone within reach Nurse Communication: Mobility status (and ok for dc home)         Time:  8466-5993 PT Time Calculation (min) (ACUTE ONLY): 22 min   Charges:   PT Evaluation $PT Eval Low Complexity: 1 Procedure     PT G CodesQuin Hoop 07/19/2015, 12:46 PM  Roney Marion, [place] Pager [phone] Office [phone]

## 2015-07-19 NOTE — Progress Notes (Signed)
Pt Quain. Q, hx Type 2 DM.  Pt reports home blood sugar runs in 150s but does not regularly test his blood sugar at home stating that he is "managing it with diet". Pt has scheduled Liraglutide(Victoza) but med is not in pharmacy formulary; Pt was told durung daytime to have family bring Liraglutide but now reports that he does not need it tonight. CBG was 266. Pt has no insulin coverage nor scheduled CBG monitoring. Dr.Xu answering service was called and this RN confirmed with service that page would be made out. Have not heard back from any attendings thus far. Will continue to monitor pt.

## 2015-08-28 ENCOUNTER — Ambulatory Visit: Payer: Medicaid Other | Attending: Orthopaedic Surgery

## 2015-08-28 DIAGNOSIS — M6281 Muscle weakness (generalized): Secondary | ICD-10-CM | POA: Diagnosis present

## 2015-08-28 DIAGNOSIS — M25672 Stiffness of left ankle, not elsewhere classified: Secondary | ICD-10-CM

## 2015-08-28 DIAGNOSIS — R600 Localized edema: Secondary | ICD-10-CM | POA: Diagnosis present

## 2015-08-28 DIAGNOSIS — R262 Difficulty in walking, not elsewhere classified: Secondary | ICD-10-CM | POA: Diagnosis present

## 2015-08-28 NOTE — Therapy (Signed)
[facility] [address] [place], [place], [address] Phone: [phone]   Fax:  [phone]  Physical Therapy Evaluation  Patient Details  Name: Christopher Abbott MRN: [id number] Date of Birth: January 01, 1976 Referring Provider: Doneen Poisson, MD  Encounter Date: 08/28/2015      PT End of Session - 08/28/15 1148    Visit Number 1   Number of Visits 4   Date for PT Re-Evaluation 10/20/15   Authorization Type Medicaid   PT Start Time 1100   PT Stop Time 1143   PT Time Calculation (min) 43 min   Activity Tolerance Patient tolerated treatment well   Behavior During Therapy [facility] for tasks assessed/performed      Past Medical History  Diagnosis Date  . Hypertension   . GERD (gastroesophageal reflux disease)   . Diabetes mellitus     type 2    Past Surgical History  Procedure Laterality Date  . No past surgeries    . Achilles tendon surgery Left 07/18/2015    Procedure: LEFT ACHILLES TENDON DIRECT PRIMARY REPAIR;  Surgeon: Kathryne Hitch, MD;  Location: [facility] OR;  Service: Orthopedics;  Laterality: Left;    There were no vitals filed for this visit.  Visit Diagnosis:  Stiffness of left ankle, not elsewhere classified - Plan: PT plan of care cert/re-cert  Difficulty in walking, not elsewhere classified - Plan: PT plan of care cert/re-cert  Localized edema - Plan: PT plan of care cert/re-cert  Muscle weakness (generalized) - Plan: PT plan of care cert/re-cert      Subjective Assessment - 08/28/15 1110    Subjective He reports tear in Lt achilles tendon playing basket ball.  Now post repair.  He reports no resitictions.    Limitations --  Play sports, run   How long can you sit comfortably? As needed   How long can you stand comfortably? As needed   How long can you walk comfortably? not sure , walks as needed   Patient Stated Goals To return to 100%   Currently in Pain? No/denies  discomfort with stretch of  tendon otherwise not real pain.    Multiple Pain Sites No            [facility] PT Assessment - 08/28/15 1104    Assessment   Medical Diagnosis Achilles tendon repain LT   Referring Provider Doneen Poisson, MD   Onset Date/Surgical Date 07/18/15   Next MD Visit 09/27/15   Prior Therapy None   Precautions   Required Braces or Orthoses Other Brace/Splint   Other Brace/Splint ASO , using Cam suport boot today as he said he did not have shoes to wear.    Restrictions   Other Position/Activity Restrictions WBAT   Balance Screen   Has the patient fallen in the past 6 months No   Has the patient had a decrease in activity level because of a fear of falling?  No   Is the patient reluctant to leave their home because of a fear of falling?  No   Prior Function   Level of Independence Independent   Cognition   Overall Cognitive Status Within Functional Limits for tasks assessed   Observation/Other Assessments-Edema    Edema Figure 8   Figure 8 Edema   Figure 8 - Right  63 cm   Figure 8 - Left  61 cm   ROM / Strength   AROM / PROM / Strength AROM;PROM;Strength   AROM   Overall AROM Comments  Great toe RT 64 degrees LT   62 degrees   AROM Assessment Site Ankle   Right/Left Ankle Right;Left   Right Ankle Dorsiflexion 93   Right Ankle Plantar Flexion 44   Right Ankle Inversion 20   Right Ankle Eversion 34   Left Ankle Dorsiflexion 86   Left Ankle Plantar Flexion 42   Left Ankle Inversion 32   Left Ankle Eversion 14   PROM   PROM Assessment Site Ankle   Right/Left Ankle Left   Left Ankle Dorsiflexion 90   Left Ankle Plantar Flexion 45   Left Ankle Inversion 34   Left Ankle Eversion 25   Strength   Strength Assessment Site Ankle   Right/Left Ankle Right;Left   Right Ankle Dorsiflexion 5/5   Right Ankle Plantar Flexion 5/5   Right Ankle Inversion 5/5   Right Ankle Eversion 5/5   Left Ankle Dorsiflexion 5/5   Left Ankle Plantar Flexion 4-/5   Left Ankle Inversion 5/5    Left Ankle Eversion 5/5   Ambulation/Gait   Gait Comments Independent no device with boot                                PT Long Term Goals - 08/28/15 1308    PT LONG TERM GOAL #1   Title He will be independent with all HEP during POC   Baseline No program   Time 8   Period Weeks   Status New   PT LONG TERM GOAL #2   Title He will be walking community distances with no pain and normal gait pattern   Baseline Decreased weight bearing and only walking shorter community distance   Time 8   Period Weeks   Status New   PT LONG TERM GOAL #3   Title He will return to playing with kids   Baseline Not fully playing with kids due to difficulty walking normal   Time 8   Period Weeks   Status New   PT LONG TERM GOAL #4   Title He will return to some basic jogging to return to playing with kids.    Baseline Not able to play with kids fully due to limp   Period Days   Status New   PT LONG TERM GOAL #5   Title LT PF 4+/5 or better to facilitae jogging and return to normal higher level activity   Baseline 4-/5 LT PF limiting walking   Time 8   Period Weeks   Status New               Plan - 08/28/15 1149    Clinical Impression Statement Mr Meskill presents with swelling , weakness and tightness of LT ankle and is walking with limp and bracing limiting return to running and return to sports and playing with kids post  Achilles tendon repair (CPT code [address] surgery date 07/18/15). He essentially has no pain.  He will do well though he will belimited in PT visist due to Medicaid limits   Pt will benefit from skilled therapeutic intervention in order to improve on the following deficits Decreased activity tolerance;Decreased range of motion;Decreased strength;Increased edema;Difficulty walking   Rehab Potential Good   PT Frequency 1x / week   PT Duration --  every 3 weeks starting end of next week if visits authorized   PT Treatment/Interventions  Cryotherapy;Electrical Stimulation;Moist Heat;Functional mobility training;Therapeutic exercise;Patient/family education;Manual techniques;Taping;Vasopneumatic Device;Passive range of motion  PT Next Visit Plan Stretching and strength /balance LT leg. review stretching and incr band to green if able   PT Home Exercise Plan red T band and DF/PF stretch   Consulted and Agree with Plan of Care Patient         Problem List Patient Active Problem List   Diagnosis Date Noted  . 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 Rupture of left Achilles tendon 07/18/2015    Caprice RedChasse, Lace Chenevert M PT 08/28/2015, 1:28 PM  [facility] [address] [place], [place], [address] Phone: [phone]   Fax:  [phone]  Name: Sung AmabileQuinton Almendarez MRN: [id number] Date of Birth: 08/18/1975

## 2015-09-05 ENCOUNTER — Ambulatory Visit: Payer: Medicaid Other | Admitting: Physical Therapy

## 2015-09-05 DIAGNOSIS — R600 Localized edema: Secondary | ICD-10-CM

## 2015-09-05 DIAGNOSIS — M25672 Stiffness of left ankle, not elsewhere classified: Secondary | ICD-10-CM | POA: Diagnosis not present

## 2015-09-05 DIAGNOSIS — M6281 Muscle weakness (generalized): Secondary | ICD-10-CM

## 2015-09-05 DIAGNOSIS — R262 Difficulty in walking, not elsewhere classified: Secondary | ICD-10-CM

## 2015-09-05 NOTE — Patient Instructions (Signed)
PLANTARFLEXION STRENGTHENING:   Balancing ActANKLE: Plantarflexion, Bilateral - Standing   Stand with upright posture. Raise heels up as high as possible. _10__ reps per set, _3__ sets per day, __7_ days per week Hold onto a support. SLOWLY  Jden!!!!!!!!!!      Heel Raise: Standing   Toes on board, heels on floor, knees slightly bent, rise up on toes as high as possible. Do _3___ sets. Complete __10__ repetitions each set.   No pain just to stretch.  http://st.exer.us/132    Heel Raise: Unilateral (Standing)   Balance on left foot, then rise on ball of foot. Repeat __5__ times per set. Do _1___ sets per session. Do _3___ sessions per day. Try to hold 30 to 60 seconds on left leg  http://[email or web address].[email or web address]   FUNCTIONAL MOBILITY: Toe Walking   Walk forward on toes. ___ reps per set, ___ sets per day, ___ days per week Use assistive device.   DORSIFLEXION STRENGTHENING:  Toe Raise (Standing) and heel raise standing    Rock back on heels. And hold for 3 seconds and rock onto toes and hold for 3 sec Repeat _15___ times per set. Do __2__ sets per session. Do _2___ sessions per day.  Step Down: Anterior   From 4-6" step, reach one leg forward as far as possible, still able to return easily. Touch toe and heel to floor. Return to single leg balance. Repeat with other leg. Repeat _15 x 2___ times. Do _1-2___ sessions per day.   Copyright  VHI. All rights reserved.  Step-Up: Lateral   Step up to side with right leg. Bring other foot up onto _6___ inch step. Return to floor position with left leg. Repeat _15 x2___ times per session. Do __2__ sessions per day. .  Copyright  VHI. All rights reserved.  Forward   Facing step, place one leg on step, flexed at hip. Step up slowly, bringing hips in line with knee and shoulder. Bring other foot onto step. Reverse process to step back down. Repeat with other leg. Do __15__ repetitions, _2___  sets.  http://bt.exer.us/154   Copyright  VHI. All rights reserved.   [name],  Do every exericise slow and controlled .   No one gets a prize for doing it faster.  Quality of movement is what matters! Garen LahLawrie Jimie Kuwahara, PT 09/05/2015 10:06 AM Phone: [phone] Fax: [phone]

## 2015-09-05 NOTE — Therapy (Signed)
[facility] [address] [place], [place], [address] Phone: [phone]   Fax:  [phone]  Physical Therapy Treatment  Patient Details  Name: Christopher Abbott MRN: [id number] Date of Birth: 05/07/1976 Referring Provider: Doneen Poissonhristopher Blackman, MD  Encounter Date: 09/05/2015      PT End of Session - 09/05/15 0932    Visit Number 2   Number of Visits 4   Date for PT Re-Evaluation 10/20/15   Authorization Type Medicaid   PT Start Time 0931   PT Stop Time 1015   PT Time Calculation (min) 44 min   Activity Tolerance Patient tolerated treatment well   Behavior [facility] for tasks assessed/performed      Past Medical History  Diagnosis Date  . Hypertension   . GERD (gastroesophageal reflux disease)   . Diabetes mellitus     type 2    Past Surgical History  Procedure Laterality Date  . No past surgeries    . Achilles tendon surgery Left 07/18/2015    Procedure: LEFT ACHILLES TENDON DIRECT PRIMARY REPAIR;  Surgeon: Kathryne Hitchhristopher Y Blackman, MD;  [facility];  Service: Orthopedics;  Laterality: Left;    There were no vitals filed for this visit.      Subjective Assessment - 09/05/15 0934    Subjective I feel like I am getting stronger and better. I am walking more. I have a limp  and feel tight.   Patient Stated Goals To return to 100%   Currently in Pain? No/denies  just minor ache in heel area when stretching            [facility] PT Assessment - 09/05/15 1010    AROM   Left Ankle Dorsiflexion 97  + 7 DF                     [facility] Adult PT Treatment/Exercise - 09/05/15 [id number]    Ambulation/Gait   Gait Comments independent using ASO brace    Self-Care   Self-Care Retrograde Massage  scar tissue massage for self educated and return demo   Knee/Hip Exercises: Standing   Lateral Step Up 2 sets;10 reps;Step Height: 6";Hand Hold: 1   Forward Step Up Hand Hold: 1;10 reps;2 sets;Step Height: 6"    Step Down 2 sets;Step Height: 6";Hand Hold: 1;10 reps   Other Standing Knee Exercises mini squat with UE support 2 fingers for balance x 10 x 2 / heel raise and toe raise x 10 with UE support   Other Standing Knee Exercises heel raise and stretch on edge of step 10 x 2   Ankle Exercises: Standing   Vector Stance Left;Other (comment)  left on Ther Ex pad 15 x 3    Vector Stance Limitations pt using one finger balance hold to complete   SLS 30 sec  x 3   Ankle Exercises: Stretches   Gastroc Stretch 3 reps;30 seconds   Gastroc Stretch Limitations pt comments on tightness and PT directing toes pointed forward.   Ankle Exercises: Seated   Other Seated Ankle Exercises AROM with blue t band for DF, IN EV x 10 with blue t band                PT Education - 09/05/15 1010    Education provided Yes   Education Details added Standing ankle strengthening and step ups to HEP   Person(s) Educated Patient   Methods Explanation;Demonstration;Verbal cues;Handout;Tactile cues   Comprehension Verbalized understanding;Returned demonstration  PT Long Term Goals - 09/05/15 1020    PT LONG TERM GOAL #1   Title He will be independent with all HEP during POC   Baseline HEP given today   Time 8   Period Weeks   Status On-going   PT LONG TERM GOAL #2   Title He will be walking community distances with no pain and normal gait pattern   Baseline Pt with antalgic gait   Time 8   Period Weeks   Status On-going   PT LONG TERM GOAL #3   Title He will return to playing with kids   Time 8   Period Weeks   Status On-going   PT LONG TERM GOAL #4   Title He will return to some basic jogging to return to playing with kids.    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  Pt is able to walk with greater ease.  Pt with increased DF on left to 7 degrees (97) today.  Pt able to tolerate standing exericises stretches and strengthening.  but unable to tolerate waling on heels or toes today.  Pt progressed to working with blue t band.  Pt  progreesing well post 7 weeks.    Rehab Potential Good   PT Frequency 1x / week   PT Duration --  every 3 weeks to end or certification   PT Treatment/Interventions Cryotherapy;Electrical Stimulation;Moist Heat;Functional mobility training;Therapeutic exercise;Patient/family education;Manual techniques;Taping;Vasopneumatic Device;Passive range of motion   PT Next Visit Plan progress onto heel and toe walking as tolerated.  Check HEP   PT Home Exercise Plan blue t band and standing exericises within protocol   Consulted and Agree with Plan of Care Patient      Patient will benefit from skilled therapeutic intervention in order to improve the following deficits and impairments:  Decreased activity tolerance, Decreased range of motion, Decreased strength, Increased edema, Difficulty walking  Visit Diagnosis: Stiffness of left ankle, not elsewhere classified  Difficulty in walking, not elsewhere classified  Localized edema  Muscle weakness (generalized)     Problem List Patient Active Problem List   Diagnosis Date Noted  . 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 Rupture of left Achilles tendon 07/18/2015   Garen Lah, PT 09/05/2015 1:46 PM Phone: [phone] Fax: [phone]  [facility] [address] [place], [place], [address] Phone: [phone]   Fax:  [phone]  Name: Christopher Abbott MRN: [id number] Date of Birth: 26-May-1976

## 2015-09-18 ENCOUNTER — Ambulatory Visit: Payer: Medicaid Other | Admitting: Physical Therapy

## 2015-09-18 DIAGNOSIS — R262 Difficulty in walking, not elsewhere classified: Secondary | ICD-10-CM

## 2015-09-18 DIAGNOSIS — M25672 Stiffness of left ankle, not elsewhere classified: Secondary | ICD-10-CM | POA: Diagnosis not present

## 2015-09-18 DIAGNOSIS — R600 Localized edema: Secondary | ICD-10-CM

## 2015-09-18 DIAGNOSIS — M6281 Muscle weakness (generalized): Secondary | ICD-10-CM

## 2015-09-18 NOTE — Therapy (Addendum)
[place], [place], [address] Phone: [phone]   Fax:  [phone]  Physical Therapy Treatment  Patient Details  Name: Christopher Abbott MRN: [id number] Date of Birth: 15-Jan-1976 Referring Provider: Jean Rosenthal, MD  Encounter Date: 09/18/2015      PT End of Session - 09/18/15 0820    Visit Number 3   Number of Visits 4   Date for PT Re-Evaluation 10/20/15   Authorization Type Medicaid   PT Start Time 0800   PT Stop Time 0845   PT Time Calculation (min) 45 min   Activity Tolerance Patient limited by pain   Behavior During Therapy [facility] for tasks assessed/performed      Past Medical History  Diagnosis Date  . Hypertension   . GERD (gastroesophageal reflux disease)   . Diabetes mellitus     type 2    Past Surgical History  Procedure Laterality Date  . No past surgeries    . Achilles tendon surgery Left 07/18/2015    Procedure: LEFT ACHILLES TENDON DIRECT PRIMARY REPAIR;  Surgeon: Mcarthur Rossetti, MD;  Location: [place];  Service: Orthopedics;  Laterality: Left;    There were no vitals filed for this visit.      Subjective Assessment - 09/18/15 0812    Subjective Pt reports no pain today. He reports some swelling but overall it feels good. He continues to have neumbenss on the latera side of his ankle.    Currently in Pain? No/denies                         [facility] Adult PT Treatment/Exercise - 09/18/15 0001    Knee/Hip Exercises: Standing   Lateral Step Up 2 sets;10 reps;Step Height: 8";Hand Hold: 1   Forward Step Up Hand Hold: 1;10 reps;2 sets;Step Height: 6"   Step Down 2 sets;Step Height: 8";Hand Hold: 1;10 reps   Other Standing Knee Exercises mini squat with UE support 2 fingers for balance x 10 x 2 / heel raise and toe raise x 10 with UE support   Other Standing Knee Exercises heel raise and stretch on edge of step 10 x 2   Ankle Exercises: Standing   Vector Stance Left;Other (comment)  left on Ther Ex pad 15 x 3    Vector Stance Limitations pt using one finger balance hold to complete   SLS 30 sec  x 3; dot drill 2x5; cone drill 2x10;    Ankle Exercises: Seated   Other Seated Ankle Exercises AROM with blue t band for DF, IN EV x 10 with black t band   Other Seated Ankle Exercises leg press 2x10 65 lbs; heel raise on leg press 65#                PT Education - 09/18/15 0818    Education Details updated HEP. Pt advised not to over-do exercises at [employer].    Person(s) Educated Patient   Methods Explanation;Demonstration;Verbal cues;Tactile cues   Comprehension Verbalized understanding             PT Long Term Goals - 09/18/15 0937    PT LONG TERM GOAL #1   Title He will be independent with all HEP during POC   Baseline upodated HEP today    Time 8   Period Weeks   Status On-going   PT LONG TERM GOAL #2   Title He will be walking community distances with no pain  and normal gait pattern   Baseline continues to have decreased L single leg stance time    PT LONG TERM GOAL #3   Title He will return to playing with kids   Baseline Doing more but still limited    Time 8   Period Weeks   Status On-going   PT LONG TERM GOAL #4   Title He will return to some basic jogging to return to playing with kids.    Baseline Not able to play with kids fully due to limp   Time 8   Status On-going   PT LONG TERM GOAL #5   Title LT PF 4+/5 or better to facilitae jogging and return to normal higher level activity   Baseline 4-/5 LT PF limiting walking   Time 8   Period Weeks   Status On-going               Plan - 09/18/15 7416    Clinical Impression Statement The patient tolerated treatment very well. PT was able to add in light gym activity and advance his sinlge leg stance activity. His DF was measured at 15 degrees. He declined ice after her treatment.    Rehab Potential Good   PT Frequency 1x / week   PT  Treatment/Interventions Cryotherapy;Electrical Stimulation;Moist Heat;Functional mobility training;Therapeutic exercise;Patient/family education;Manual techniques;Taping;Vasopneumatic Device;Passive range of motion   PT Next Visit Plan assess tolerance to new activity. Pt will only have 1 more visit left. Work into a Chiropractor and a Set designer. Advise the patient on protocol restrictions.    PT Home Exercise Plan blue t band and standing exericises within protocol; dot drill; cone tocuh, single leg stane, leg press, leg press with heel raise.    Consulted and Agree with Plan of Care Patient      Patient will benefit from skilled therapeutic intervention in order to improve the following deficits and impairments:  Decreased activity tolerance, Decreased range of motion, Decreased strength, Increased edema, Difficulty walking  Visit Diagnosis: Stiffness of left ankle, not elsewhere classified  Difficulty in walking, not elsewhere classified  Localized edema  Muscle weakness (generalized)  Neuro re-ed: to increase single leg stability  There-ex: to improve L LE strength and stability.    Problem List Patient Active Problem List   Diagnosis Date Noted  . 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 Rupture of left Achilles tendon 07/18/2015    Carney Living PT, DPT  09/18/2015, 9:46 AM  [facility] [address] [place], [place], [address] Phone: [phone]   Fax:  [phone]  Name: Christopher Abbott MRN: [id number] Date of Birth: 01-Jun-1975    PHYSICAL THERAPY DISCHARGE SUMMARY  Visits from Start of Care: 3  Current functional level related to goals / functional outcomes: Unknown as he no showed his 10/02/15 appointment and did not return   Remaining deficits: Unknown   Education / Equipment: HEP  Plan:                                                    Patient goals were not met. Patient is being discharged  due to not returning since the last visit.  ?????    Darrel Hoover, PT  10/19/15  1:32 PM

## 2015-10-02 ENCOUNTER — Ambulatory Visit: Payer: Medicaid Other | Attending: Orthopaedic Surgery | Admitting: Physical Therapy

## 2016-11-01 ENCOUNTER — Encounter (HOSPITAL_COMMUNITY): Payer: Self-pay

## 2016-11-01 ENCOUNTER — Ambulatory Visit (HOSPITAL_COMMUNITY)
Admission: EM | Admit: 2016-11-01 | Discharge: 2016-11-01 | Disposition: A | Discharge status: Home / Self Care | Payer: Medicaid Other | Attending: Family Medicine | Admitting: Family Medicine

## 2016-11-01 DIAGNOSIS — M7918 Myalgia, other site: Secondary | ICD-10-CM

## 2016-11-01 DIAGNOSIS — B353 Tinea pedis: Secondary | ICD-10-CM | POA: Diagnosis not present

## 2016-11-01 DIAGNOSIS — M25551 Pain in right hip: Secondary | ICD-10-CM | POA: Diagnosis not present

## 2016-11-01 DIAGNOSIS — G5701 Lesion of sciatic nerve, right lower limb: Secondary | ICD-10-CM | POA: Diagnosis not present

## 2016-11-01 MED ORDER — KETOROLAC TROMETHAMINE 30 MG/ML IJ SOLN
INTRAMUSCULAR | Status: AC
  Filled 2016-11-01: qty 1

## 2016-11-01 MED ORDER — FLUCONAZOLE 150 MG PO TABS: 150.0000 mg | ORAL_TABLET | Freq: Every day | ORAL | 0 refills | Status: DC

## 2016-11-01 MED ORDER — KETOCONAZOLE 2 % EX CREA: 1.0000 "application " | TOPICAL_CREAM | Freq: Every day | CUTANEOUS | 0 refills | Status: DC

## 2016-11-01 MED ORDER — KETOROLAC TROMETHAMINE 30 MG/ML IJ SOLN
30.0000 mg | Freq: Once | INTRAMUSCULAR | Status: AC
  Administered 2016-11-01: 30 mg via INTRAMUSCULAR

## 2016-11-01 MED ORDER — IBUPROFEN 200 MG PO TABS: 800.0000 mg | ORAL_TABLET | Freq: Four times a day (QID) | ORAL | 0 refills | Status: DC | PRN

## 2016-11-01 MED ORDER — CYCLOBENZAPRINE HCL 10 MG PO TABS: 10.0000 mg | ORAL_TABLET | Freq: Two times a day (BID) | ORAL | 0 refills | Status: DC | PRN

## 2016-11-01 NOTE — ED Provider Notes (Signed)
CSN: [id number]     Arrival date & time 11/01/16  1552 History   None    Chief Complaint  Patient presents with  . Hip Pain   (Consider location/radiation/quality/duration/timing/severity/associated sxs/prior Treatment) Patient c/o right hip pain for 5 days.  Patient c/o left little toe skin infection.   The history is provided by the patient.  Hip Pain  This is a new problem. The problem occurs constantly. The problem has not changed since onset.Nothing aggravates the symptoms. Nothing relieves the symptoms. He has tried nothing for the symptoms.    Past Medical History:  Diagnosis Date  . Diabetes mellitus    type 2  . GERD (gastroesophageal reflux disease)   . Hypertension    Past Surgical History:  Procedure Laterality Date  . ACHILLES TENDON SURGERY Left 07/18/2015   Procedure: LEFT ACHILLES TENDON DIRECT PRIMARY REPAIR;  Surgeon: Kathryne Hitchhristopher Y Blackman, MD;  [facility];  Service: Orthopedics;  Laterality: Left;  . NO PAST SURGERIES     Family History  Problem Relation Age of Onset  . Diabetes Mother   . Diabetes Father    Social History  Substance Use Topics  . Smoking status: Current Every Day Smoker    Packs/day: 0.50    Types: Cigarettes  . Smokeless tobacco: Never Used  . Alcohol use Yes     Comment: none now, once per week or less    Review of Systems  Constitutional: Negative.   HENT: Negative.   Eyes: Negative.   Respiratory: Negative.   Cardiovascular: Negative.   Gastrointestinal: Negative.   Endocrine: Negative.   Genitourinary: Negative.   Musculoskeletal: Positive for arthralgias.  Skin: Positive for rash.  Allergic/Immunologic: Negative.   Neurological: Negative.   Hematological: Negative.     Allergies  Patient has no known allergies.  Home Medications   Prior to Admission medications   Medication Sig Start Date End Date Taking? Authorizing Provider  esomeprazole (NEXIUM) 40 MG capsule Take 40 mg by mouth daily at 12 noon.    Yes [provider]  insulin glargine (LANTUS) 100 UNIT/ML injection Inject 70 Units into the skin daily.    Yes [provider]  JANUMET XR [phone] MG TB24 Take 1 tablet by mouth daily. 07/10/15  Yes [provider]  Liraglutide (VICTOZA) 18 MG/3ML SOPN Inject 1.8 mg into the skin daily.   Yes [provider]  lisinopril-hydrochlorothiazide (PRINZIDE,ZESTORETIC) 20-12.5 MG tablet Take 1 tablet by mouth daily. for high blood pressure 07/02/15  Yes [provider]  pravastatin (PRAVACHOL) 40 MG tablet Take 40 mg by mouth every evening. for cholesterol 05/30/15  Yes [provider]  cyclobenzaprine (FLEXERIL) 10 MG tablet Take 1 tablet (10 mg total) by mouth 2 (two) times daily as needed for muscle spasms. 11/01/16   Deatra Canterxford, William J, FNP  fluconazole (DIFLUCAN) 150 MG tablet Take 1 tablet (150 mg total) by mouth daily. 11/01/16   Deatra Canterxford, William J, FNP  ibuprofen (ADVIL,MOTRIN) 200 MG tablet Take 4 tablets (800 mg total) by mouth every 6 (six) hours as needed. 11/01/16   Deatra Canterxford, William J, FNP  ketoconazole (NIZORAL) 2 % cream Apply 1 application topically daily. 11/01/16   Deatra Canterxford, William J, FNP  oxyCODONE-acetaminophen (ROXICET) 5-325 MG tablet Take 1-2 tablets by mouth every 4 (four) hours as needed. 07/19/15   [name], Christopher Y, MD   Meds Ordered and Administered this Visit   Medications  ketorolac (TORADOL) 30 MG/ML injection 30 mg (30 mg Intramuscular Given 11/01/16 1644)  BP 126/84 (BP Location: Right Arm)   Pulse 80   Temp 99.1 F (37.3 C) (Oral)   Resp 20   SpO2 97%  No data found.   Physical Exam  Constitutional: He appears well-developed and well-nourished.  HENT:  Head: Normocephalic and atraumatic.  Eyes: Conjunctivae and EOM are normal. Pupils are equal, round, and reactive to light.  Neck: Normal range of motion. Neck supple.  Cardiovascular: Normal rate, regular rhythm and normal heart sounds.   Pulmonary/Chest: Effort  normal and breath sounds normal.  Musculoskeletal: He exhibits tenderness.  TTP right buttocks and deep gluteal muscles.  Skin: Rash noted.  Left 5th toe web with white macerated skin.  Nursing note and vitals reviewed.   Urgent Care Course     Procedures (including critical care time)  Labs Review Labs Reviewed - No data to display  Imaging Review No results found.   Visual Acuity Review  Right Eye Distance:   Left Eye Distance:   Bilateral Distance:    Right Eye Near:   Left Eye Near:    Bilateral Near:         MDM   1. Right hip pain   2. Piriformis muscle pain   3. Tinea pedis of left foot    Follow up with Dr. Ethelene Hal if not better  Toradol 30mg  IM Cyclobenzaprine 10 mg one po bid prn #20 Motrin      Deatra Canter, FNP 11/01/16 1746

## 2016-11-01 NOTE — ED Triage Notes (Signed)
Pt having right hip pain for the past 5 days. Has been hurting in the past but usually goes away. Did try tylenol without relief. And also having left pinky toe pain and said it's white in between his toes and moist.

## 2016-12-26 ENCOUNTER — Ambulatory Visit (HOSPITAL_COMMUNITY)
Admission: EM | Admit: 2016-12-26 | Discharge: 2016-12-26 | Disposition: A | Discharge status: Home / Self Care | Payer: Medicaid Other | Attending: Internal Medicine | Admitting: Internal Medicine

## 2016-12-26 ENCOUNTER — Encounter (HOSPITAL_COMMUNITY): Payer: Self-pay | Admitting: Emergency Medicine

## 2016-12-26 DIAGNOSIS — S161XXA Strain of muscle, fascia and tendon at neck level, initial encounter: Secondary | ICD-10-CM | POA: Diagnosis not present

## 2016-12-26 MED ORDER — MELOXICAM 7.5 MG PO TABS: 7.5000 mg | ORAL_TABLET | Freq: Two times a day (BID) | ORAL | 0 refills | Status: AC | PRN

## 2016-12-26 MED ORDER — CYCLOBENZAPRINE HCL 10 MG PO TABS: 10.0000 mg | ORAL_TABLET | Freq: Two times a day (BID) | ORAL | 0 refills | Status: DC | PRN

## 2016-12-26 NOTE — ED Triage Notes (Signed)
PT reports neck pain that started 1 week ago.

## 2017-02-07 ENCOUNTER — Ambulatory Visit (HOSPITAL_COMMUNITY)
Admission: EM | Admit: 2017-02-07 | Discharge: 2017-02-07 | Disposition: A | Discharge status: Home / Self Care | Payer: Medicaid Other | Attending: Family Medicine | Admitting: Family Medicine

## 2017-02-07 ENCOUNTER — Encounter (HOSPITAL_COMMUNITY): Payer: Self-pay | Admitting: Family Medicine

## 2017-02-07 DIAGNOSIS — K602 Anal fissure, unspecified: Secondary | ICD-10-CM | POA: Diagnosis not present

## 2017-02-07 MED ORDER — HYDROCORTISONE 2.5 % RE CREA
TOPICAL_CREAM | RECTAL | 2 refills | Status: DC
Start: 2017-02-07 — End: 2023-05-12

## 2017-02-07 NOTE — Discharge Instructions (Signed)
You have an anal fissure.  This may just be a simple problem from hard stools passing.  The cream prescribed should help.  This may also represent a mild case of ulcerative colitis.  Because of this possibility, I want you to make an appointment with the doctors listed below.

## 2017-02-07 NOTE — ED Notes (Signed)
Triaged by provider  

## 2017-02-07 NOTE — ED Provider Notes (Signed)
[facility]    CSN: [id number] Arrival date & time: 02/07/17  1006     History   Chief Complaint No chief complaint on file.   HPI Christopher Abbott is a 41 y.o. male.   41 yo man with BRBPR for weeks intermittently.  He has no other GI symptoms.  His father has "UC".  No rectal pain, rash or joint problems.  Blood is seen in bowl and on tissue.        Past Medical History:  Diagnosis Date  . Diabetes mellitus    type 2  . GERD (gastroesophageal reflux disease)   . Hypertension     Patient Active Problem List   Diagnosis Date Noted  . 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 Rupture of left Achilles tendon 07/18/2015    Past Surgical History:  Procedure Laterality Date  . ACHILLES TENDON SURGERY Left 07/18/2015   Procedure: LEFT ACHILLES TENDON DIRECT PRIMARY REPAIR;  Surgeon: Kathryne Hitch, MD;  Location: [facility];  Service: Orthopedics;  Laterality: Left;  . NO PAST SURGERIES         Home Medications    Prior to Admission medications   Medication Sig Start Date End Date Taking? Authorizing Provider  esomeprazole (NEXIUM) 40 MG capsule Take 40 mg by mouth daily at 12 noon.    [provider]  hydrocortisone (ANUSOL-HC) 2.5 % rectal cream Apply rectally 2 times daily 02/07/17   Elvina Sidle, MD  insulin glargine (LANTUS) 100 UNIT/ML injection Inject 70 Units into the skin daily.     [provider]  JANUMET XR [phone] MG TB24 Take 1 tablet by mouth daily. 07/10/15   [provider]  Liraglutide (VICTOZA) 18 MG/3ML SOPN Inject 1.8 mg into the skin daily.    [provider]  lisinopril-hydrochlorothiazide (PRINZIDE,ZESTORETIC) 20-12.5 MG tablet Take 1 tablet by mouth daily. for high blood pressure 07/02/15   [provider]  pravastatin (PRAVACHOL) 40 MG tablet Take 40 mg by mouth every evening. for cholesterol 05/30/15   [provider]    Family History Family History  Problem Relation Age  of Onset  . Diabetes Mother   . Diabetes Father     Social History Social History  Substance Use Topics  . Smoking status: Current Some Day Smoker    Packs/day: 0.20    Types: Cigarettes  . Smokeless tobacco: Never Used  . Alcohol use Yes     Comment: none now, once per week or less     Allergies   Patient has no known allergies.   Review of Systems Review of Systems  Gastrointestinal: Positive for anal bleeding and blood in stool. Negative for abdominal distention, abdominal pain, constipation, diarrhea, nausea, rectal pain and vomiting.  All other systems reviewed and are negative.    Physical Exam Triage Vital Signs ED Triage Vitals  Enc Vitals Group     BP      Pulse      Resp      Temp      Temp src      SpO2      Weight      Height      Head Circumference      Peak Flow      Pain Score      Pain Loc      Pain Edu?      Excl. in GC?    No data found.   Updated Vital Signs BP 129/80 (BP Location:  Left Arm)   Pulse 79   Temp 97.8 F (36.6 C) (Oral)   Resp 16   SpO2 98%    Physical Exam  Constitutional: He is oriented to person, place, and time. He appears well-developed and well-nourished.  HENT:  Right Ear: External ear normal.  Left Ear: External ear normal.  Mouth/Throat: Oropharynx is clear and moist.  Eyes: Pupils are equal, round, and reactive to light. Conjunctivae are normal.  Neck: Normal range of motion. Neck supple.  Pulmonary/Chest: Effort normal.  Genitourinary:  Genitourinary Comments: Anal fissure identified at 9 o'clock  Musculoskeletal: Normal range of motion.  Neurological: He is alert and oriented to person, place, and time.  Skin: Skin is warm and dry.     UC Treatments / Results  Labs (all labs ordered are listed, but only abnormal results are displayed) Labs Reviewed - No data to display  EKG  EKG Interpretation None       Radiology No results found.  Procedures Procedures (including critical care  time)  Medications Ordered in UC Medications - No data to display   Initial Impression / Assessment and Plan / UC Course  I have reviewed the triage vital signs and the nursing notes.  Pertinent labs & imaging results that were available during my care of the patient were reviewed by me and considered in my medical decision making (see chart for details).     Final Clinical Impressions(s) / UC Diagnoses   Final diagnoses:  Anal fissure    New Prescriptions New Prescriptions   HYDROCORTISONE (ANUSOL-HC) 2.5 % RECTAL CREAM    Apply rectally 2 times daily     Controlled Substance Prescriptions [place] Controlled Substance Registry consulted? Not Applicable   Elvina Sidle, MD 02/07/17 1037

## 2017-04-08 ENCOUNTER — Ambulatory Visit (HOSPITAL_COMMUNITY)
Admission: EM | Admit: 2017-04-08 | Discharge: 2017-04-08 | Disposition: A | Discharge status: Home / Self Care | Payer: Medicaid Other | Attending: Internal Medicine | Admitting: Internal Medicine

## 2017-04-08 ENCOUNTER — Encounter (HOSPITAL_COMMUNITY): Payer: Self-pay | Admitting: Emergency Medicine

## 2017-04-08 DIAGNOSIS — F17210 Nicotine dependence, cigarettes, uncomplicated: Secondary | ICD-10-CM | POA: Insufficient documentation

## 2017-04-08 DIAGNOSIS — I10 Essential (primary) hypertension: Secondary | ICD-10-CM | POA: Diagnosis not present

## 2017-04-08 DIAGNOSIS — N50811 Right testicular pain: Secondary | ICD-10-CM | POA: Diagnosis present

## 2017-04-08 DIAGNOSIS — M79662 Pain in left lower leg: Secondary | ICD-10-CM | POA: Diagnosis not present

## 2017-04-08 DIAGNOSIS — Z794 Long term (current) use of insulin: Secondary | ICD-10-CM | POA: Diagnosis not present

## 2017-04-08 DIAGNOSIS — M79661 Pain in right lower leg: Secondary | ICD-10-CM | POA: Diagnosis not present

## 2017-04-08 DIAGNOSIS — N451 Epididymitis: Secondary | ICD-10-CM | POA: Diagnosis not present

## 2017-04-08 DIAGNOSIS — Z79899 Other long term (current) drug therapy: Secondary | ICD-10-CM | POA: Insufficient documentation

## 2017-04-08 DIAGNOSIS — K219 Gastro-esophageal reflux disease without esophagitis: Secondary | ICD-10-CM | POA: Diagnosis not present

## 2017-04-08 DIAGNOSIS — E119 Type 2 diabetes mellitus without complications: Secondary | ICD-10-CM | POA: Diagnosis not present

## 2017-04-08 LAB — POCT URINALYSIS DIP (DEVICE)
Bilirubin Urine: NEGATIVE
GLUCOSE, UA: NEGATIVE mg/dL
Hgb urine dipstick: NEGATIVE
Leukocytes, UA: NEGATIVE
Nitrite: NEGATIVE
PROTEIN: NEGATIVE mg/dL
SPECIFIC GRAVITY, URINE: 1.025 (ref 1.005–1.030)
Urobilinogen, UA: 0.2 mg/dL (ref 0.0–1.0)
pH: 6 (ref 5.0–8.0)

## 2017-04-08 MED ORDER — AZITHROMYCIN 250 MG PO TABS
1000.0000 mg | ORAL_TABLET | Freq: Once | ORAL | Status: AC
  Administered 2017-04-08: 1000 mg via ORAL

## 2017-04-08 MED ORDER — CEFTRIAXONE SODIUM 250 MG IJ SOLR
250.0000 mg | Freq: Once | INTRAMUSCULAR | Status: AC
  Administered 2017-04-08: 250 mg via INTRAMUSCULAR

## 2017-04-08 MED ORDER — NAPROXEN 500 MG PO TABS: 500.0000 mg | ORAL_TABLET | Freq: Two times a day (BID) | ORAL | 0 refills | Status: AC

## 2017-04-08 MED ORDER — LIDOCAINE HCL (PF) 1 % IJ SOLN
INTRAMUSCULAR | Status: AC
  Filled 2017-04-08: qty 2

## 2017-04-08 MED ORDER — AZITHROMYCIN 250 MG PO TABS
ORAL_TABLET | ORAL | Status: AC
  Filled 2017-04-08: qty 4

## 2017-04-08 MED ORDER — CYCLOBENZAPRINE HCL 10 MG PO TABS: 10.0000 mg | ORAL_TABLET | Freq: Every evening | ORAL | 0 refills | Status: AC | PRN

## 2017-04-08 MED ORDER — CEFTRIAXONE SODIUM 250 MG IJ SOLR
INTRAMUSCULAR | Status: AC
  Filled 2017-04-08: qty 250

## 2017-04-08 NOTE — ED Provider Notes (Signed)
[facility]    CSN: [id number] Arrival date & time: 04/08/17  [id number]     History   Chief Complaint Chief Complaint  Patient presents with  . Testicle Pain  . Leg Pain    HPI Christopher Abbott is a 41 y.o. male. He presents today with vague discomfort intermittently in the right testicle x 1 wk, no swelling, not really reproducible with palpation, no rash.  No dysuria, no hematuria, no discharge.  No change in BMs.   Also some discomfort in R>L calves x 2d, played in an alumni basketball game at [employer] last weekend.  Worried about DVT.  No immobilization, no cancer, not taking a hormone product.    HPI  Past Medical History:  Diagnosis Date  . Diabetes mellitus    type 2  . GERD (gastroesophageal reflux disease)   . Hypertension     Patient Active Problem List   Diagnosis Date Noted  . 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 Rupture of left Achilles tendon 07/18/2015    Past Surgical History:  Procedure Laterality Date  . LEFT ACHILLES TENDON DIRECT PRIMARY REPAIR Left 07/18/2015   Performed by [name], Christopher Y[facility]  . NO PAST SURGERIES         Home Medications    Prior to Admission medications   Medication Sig Start Date End Date Taking? Authorizing Provider  cyclobenzaprine (FLEXERIL) 10 MG tablet Take 1 tablet (10 mg total) at bedtime as needed for up to 15 days by mouth for muscle spasms. 04/08/17 04/23/17  Eustace MooreMurray, Laura W, MD  esomeprazole (NEXIUM) 40 MG capsule Take 40 mg by mouth daily at 12 noon.    [provider]  hydrocortisone (ANUSOL-HC) 2.5 % rectal cream Apply rectally 2 times daily 02/07/17   Elvina SidleLauenstein, Kurt, MD  insulin glargine (LANTUS) 100 UNIT/ML injection Inject 70 Units into the skin daily.     [provider]  JANUMET XR [phone] MG TB24 Take 1 tablet by mouth daily. 07/10/15   [provider]  Liraglutide (VICTOZA) 18 MG/3ML SOPN Inject 1.8 mg into the skin daily.    [provider]    lisinopril-hydrochlorothiazide (PRINZIDE,ZESTORETIC) 20-12.5 MG tablet Take 1 tablet by mouth daily. for high blood pressure 07/02/15   [provider]  naproxen (NAPROSYN) 500 MG tablet Take 1 tablet (500 mg total) 2 (two) times daily for 10 days by mouth. 04/08/17 04/18/17  Eustace MooreMurray, Laura W, MD  pravastatin (PRAVACHOL) 40 MG tablet Take 40 mg by mouth every evening. for cholesterol 05/30/15   [provider]    Family History Family History  Problem Relation Age of Onset  . Diabetes Mother   . Diabetes Father   brother:  DVT (post op)  Social History Social History   Tobacco Use  . Smoking status: Current Some Day Smoker    Packs/day: 0.20    Types: Cigarettes  . Smokeless tobacco: Never Used  Substance Use Topics  . Alcohol use: Yes    Comment: none now, once per week or less  . Drug use: No     Allergies   Patient has no known allergies.   Review of Systems Review of Systems  All other systems reviewed and are negative.    Physical Exam Triage Vital Signs ED Triage Vitals [04/08/17 1018]  Enc Vitals Group     BP 137/84     Pulse Rate 88     Resp 18     Temp 98.5 F (  36.9 C)     Temp Source Oral     SpO2 96 %     Weight      Height      Pain Score      Pain Loc    Updated Vital Signs BP 137/84 (BP Location: Right Arm)   Pulse 88   Temp 98.5 F (36.9 C) (Oral)   Resp 18   SpO2 96%   Physical Exam  Constitutional: He is oriented to person, place, and time. No distress.  Alert, nicely groomed  HENT:  Head: Atraumatic.  Eyes:  Conjugate gaze, no eye redness/drainage  Neck: Neck supple.  Cardiovascular: Normal rate.  Pulmonary/Chest: No respiratory distress.  Abdominal: He exhibits no distension.  Genitourinary:  Genitourinary Comments: Scrotum without focal tenderness/redness/rash, no hernia appreciated.  Normal penis (circ)  Musculoskeletal: Normal range of motion.  R calf 17 5/16", L calf 17" No leg/ankle edema.  Wearing  short elastic brace for L ankle.  No calf tendnerness/warmth/erythema.  Gait steady  Neurological: He is alert and oriented to person, place, and time.  Skin: Skin is warm and dry.  No cyanosis  Nursing note and vitals reviewed.    UC Treatments / Results  Labs Results for orders placed or performed during the hospital encounter of 04/08/17  POCT urinalysis dip (device)  Result Value Ref Range   Glucose, UA NEGATIVE NEGATIVE mg/dL   Bilirubin Urine NEGATIVE NEGATIVE   Ketones, ur TRACE (A) NEGATIVE mg/dL   Specific Gravity, Urine 1.025 1.005 - 1.030   Hgb urine dipstick NEGATIVE NEGATIVE   pH 6.0 5.0 - 8.0   Protein, ur NEGATIVE NEGATIVE mg/dL   Urobilinogen, UA 0.2 0.0 - 1.0 mg/dL   Nitrite NEGATIVE NEGATIVE   Leukocytes, UA NEGATIVE NEGATIVE  Urine cytology ancillary only  Result Value Ref Range   Chlamydia Negative    Neisseria gonorrhea Negative    Trichomonas Negative     Procedures Procedures (including critical care time)  Medications Ordered in UC Medications  cefTRIAXone (ROCEPHIN) injection 250 mg (250 mg Intramuscular Given 04/08/17 1108)  azithromycin (ZITHROMAX) tablet 1,000 mg (1,000 mg Oral Given 04/08/17 1108)     Final Clinical Impressions(s) / UC Diagnoses   Final diagnoses:  Bilateral calf pain  Epididymitis, right   Calf pain seems most likely to be due to muscle pain.  Push fluids.   Ice for 5-10 minutes several times daily may be helpful in managing discomfort.  Prescriptions for naproxen and flexeril sent to the pharmacy.  Anticipate gradual improvement in discomfort over the next several days.  Physical therapy may be helpful for managing persistent symptoms.   Urine tests for common causes of testicular pain are pending.  Injection of rocephin and oral dose of zithromax (antibiotics) were given at the urgent care today. The urgent care will notify you if additional treatment is needed.   Recheck or followup with your primary care provider for  further evaluation if symptoms are not improving as expected.    ED Discharge Orders        Ordered    naproxen (NAPROSYN) 500 MG tablet  2 times daily     04/08/17 1102    cyclobenzaprine (FLEXERIL) 10 MG tablet  At bedtime PRN     04/08/17 1102       Controlled Substance Prescriptions [place] Controlled Substance Registry consulted? Not Applicable   Eustace MooreMurray, Laura W, MD 04/11/17 2041

## 2017-04-08 NOTE — Discharge Instructions (Addendum)
Calf pain seems most likely to be due to muscle pain.  Push fluids.  Ice for 5-10 minutes several times daily may be helpful in managing discomfort.  Prescriptions for naproxen and flexeril sent to the pharmacy.  Anticipate gradual improvement in discomfort over the next several days.  Physical therapy may be helpful for managing persistent symptoms.   Urine tests for common causes of testicular pain are pending.  Injection of rocephin and oral dose of zithromax (antibiotics) were given at the urgent care today. The urgent care will notify you if additional treatment is needed.   Recheck or followup with your primary care provider for further evaluation if symptoms are not improving as expected.

## 2017-04-08 NOTE — ED Triage Notes (Signed)
Pt sts some right testicle pain intermittently x 1 week; pt denies swelling; pt sts some cramping in calves as well

## 2017-04-09 LAB — URINE CYTOLOGY ANCILLARY ONLY
CHLAMYDIA, DNA PROBE: NEGATIVE
Neisseria Gonorrhea: NEGATIVE
Trichomonas: NEGATIVE

## 2017-06-24 ENCOUNTER — Ambulatory Visit (HOSPITAL_COMMUNITY)
Admission: EM | Admit: 2017-06-24 | Discharge: 2017-06-24 | Disposition: A | Discharge status: Home / Self Care | Payer: Medicaid Other | Attending: Family Medicine | Admitting: Family Medicine

## 2017-06-24 ENCOUNTER — Encounter (HOSPITAL_COMMUNITY): Payer: Self-pay | Admitting: Emergency Medicine

## 2017-06-24 ENCOUNTER — Other Ambulatory Visit: Payer: Self-pay

## 2017-06-24 DIAGNOSIS — J029 Acute pharyngitis, unspecified: Secondary | ICD-10-CM | POA: Diagnosis not present

## 2017-06-24 DIAGNOSIS — J020 Streptococcal pharyngitis: Secondary | ICD-10-CM | POA: Diagnosis not present

## 2017-06-24 LAB — POCT RAPID STREP A: Streptococcus, Group A Screen (Direct): POSITIVE — AB

## 2017-06-24 MED ORDER — PENICILLIN G BENZATHINE 1200000 UNIT/2ML IM SUSP
INTRAMUSCULAR | Status: AC
  Filled 2017-06-24: qty 2

## 2017-06-24 MED ORDER — PENICILLIN G BENZATHINE & PROC 900000-300000 UNIT/2ML IM SUSP
1.2000 10*6.[IU] | Freq: Once | INTRAMUSCULAR | Status: AC
  Administered 2017-06-24: 1.2 10*6.[IU] via INTRAMUSCULAR

## 2017-06-24 MED ORDER — IBUPROFEN 800 MG PO TABS
800.0000 mg | ORAL_TABLET | Freq: Once | ORAL | Status: AC
  Administered 2017-06-24: 800 mg via ORAL

## 2017-06-24 MED ORDER — IBUPROFEN 600 MG PO TABS: 600.0000 mg | ORAL_TABLET | Freq: Four times a day (QID) | ORAL | 0 refills | Status: DC | PRN

## 2017-06-24 MED ORDER — IBUPROFEN 800 MG PO TABS
ORAL_TABLET | ORAL | Status: AC
  Filled 2017-06-24: qty 1

## 2017-06-24 NOTE — ED Triage Notes (Addendum)
Pt woke up with a sore throat yesterday.  Pt denies any other symptoms, but states the throat is very sore.  He has taken Theraflu and Tylenol at 0600 this morning.

## 2017-06-24 NOTE — ED Provider Notes (Signed)
[facility]    CSN: [id number] Arrival date & time: 06/24/17  [id number]     History   Chief Complaint Chief Complaint  Patient presents with  . Sore Throat    HPI [name] is a 42 y.o. male.   Mena GoesQuinton presents with complaints of sore throat which started two days ago. Severe pain, rates pain 10/10. Without known fevers. Without abdominal pain, nausea, vomiting, diarrhea, rash, cough, congestion. Mild ear ache last night which has improved. States his children at home have had sore throats. Took tylenol at 0630 this morning as well as theraflu this morning. Did not help with pain. History of diabetes and htn. States has not had similar illness in years.    ROS per HPI.       Past Medical History:  Diagnosis Date  . Diabetes mellitus    type 2  . GERD (gastroesophageal reflux disease)   . Hypertension     Patient Active Problem List   Diagnosis Date Noted  . 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 Rupture of left Achilles tendon 07/18/2015    Past Surgical History:  Procedure Laterality Date  . ACHILLES TENDON SURGERY Left 07/18/2015   Procedure: LEFT ACHILLES TENDON DIRECT PRIMARY REPAIR;  Surgeon: Kathryne Hitchhristopher Y Blackman, MD;  [facility];  Service: Orthopedics;  Laterality: Left;  . NO PAST SURGERIES         Home Medications    Prior to Admission medications   Medication Sig Start Date End Date Taking? Authorizing Provider  esomeprazole (NEXIUM) 40 MG capsule Take 40 mg by mouth daily at 12 noon.   Yes [provider]  insulin glargine (LANTUS) 100 UNIT/ML injection Inject 70 Units into the skin daily.    Yes [provider]  JANUMET XR [phone] MG TB24 Take 1 tablet by mouth daily. 07/10/15  Yes [provider]  Liraglutide (VICTOZA) 18 MG/3ML SOPN Inject 1.8 mg into the skin daily.   Yes [provider]  lisinopril-hydrochlorothiazide (PRINZIDE,ZESTORETIC) 20-12.5 MG tablet Take 1 tablet by mouth daily.  for high blood pressure 07/02/15  Yes [provider]  pravastatin (PRAVACHOL) 40 MG tablet Take 40 mg by mouth every evening. for cholesterol 05/30/15  Yes [provider]  hydrocortisone (ANUSOL-HC) 2.5 % rectal cream Apply rectally 2 times daily 02/07/17   Christopher Abbott, Kurt, MD  ibuprofen (ADVIL,MOTRIN) 600 MG tablet Take 1 tablet (600 mg total) by mouth every 6 (six) hours as needed. 06/24/17   Christopher Abbott, Christopher B, NP    Family History Family History  Problem Relation Age of Onset  . Diabetes Mother   . Diabetes Father     Social History Social History   Tobacco Use  . Smoking status: Current Some Day Smoker    Packs/day: 0.20    Types: Cigarettes  . Smokeless tobacco: Never Used  Substance Use Topics  . Alcohol use: Yes    Comment: none now, once per week or less  . Drug use: No     Allergies   Patient has no known allergies.   Review of Systems Review of Systems   Physical Exam Triage Vital Signs ED Triage Vitals  Enc Vitals Group     BP 06/24/17 1008 130/85     Pulse Rate 06/24/17 1008 (!) 104     Resp --      Temp 06/24/17 1008 99.6 F (37.6 C)     Temp Source 06/24/17 1008 Oral     SpO2 06/24/17 1008 95 %  Weight --      Height --      Head Circumference --      Peak Flow --      Pain Score 06/24/17 1007 10     Pain Loc --      Pain Edu? --      Excl. in GC? --    No data found.  Updated Vital Signs BP 130/85 (BP Location: Left Arm)   Pulse (!) 104   Temp 99.6 F (37.6 C) (Oral)   SpO2 95%   Visual Acuity Right Eye Distance:   Left Eye Distance:   Bilateral Distance:    Right Eye Near:   Left Eye Near:    Bilateral Near:     Physical Exam  Constitutional: He is oriented to person, place, and time. He appears well-developed and well-nourished.  HENT:  Head: Normocephalic and atraumatic.  Right Ear: Tympanic membrane, external ear and ear canal normal.  Left Ear: Tympanic membrane, external ear and ear canal normal.    Nose: Nose normal. Right sinus exhibits no maxillary sinus tenderness and no frontal sinus tenderness. Left sinus exhibits no maxillary sinus tenderness and no frontal sinus tenderness.  Mouth/Throat: Uvula is midline, oropharynx is clear and moist and mucous membranes are normal. Tonsils are 2+ on the right. Tonsils are 2+ on the left. Tonsillar exudate.  Eyes: Conjunctivae are normal. Pupils are equal, round, and reactive to light.  Neck: Normal range of motion.  Cardiovascular: Normal rate and regular rhythm.  Pulmonary/Chest: Effort normal and breath sounds normal.  Lymphadenopathy:    He has no cervical adenopathy.  Neurological: He is alert and oriented to person, place, and time.  Skin: Skin is warm and dry.  Vitals reviewed.    UC Treatments / Results  Labs (all labs ordered are listed, but only abnormal results are displayed) Labs Reviewed  POCT RAPID STREP A - Abnormal; Notable for the following components:      Result Value   Streptococcus, Group A Screen (Direct) POSITIVE (*)    All other components within normal limits    EKG  EKG Interpretation None       Radiology No results found.  Procedures Procedures (including critical care time)  Medications Ordered in UC Medications  penicillin g benzathine-penicillin g procaine (BICILLIN-CR) injection 900000-300000 units (not administered)  ibuprofen (ADVIL,MOTRIN) tablet 800 mg (not administered)     Initial Impression / Assessment and Plan / UC Course  I have reviewed the triage vital signs and the nursing notes.  Pertinent labs & imaging results that were available during my care of the patient were reviewed by me and considered in my medical decision making (see chart for details).     posoitive strep, consistent with history and exam. Bicillin provided today in clinic. Ibuprofen for pain control. If symptoms worsen or do not improve in the next week to return to be seen or to follow up with PCP.   Patient verbalized understanding and agreeable to plan.    Final Clinical Impressions(s) / UC Diagnoses   Final diagnoses:  Strep throat    ED Discharge Orders        Ordered    ibuprofen (ADVIL,MOTRIN) 600 MG tablet  Every 6 hours PRN     06/24/17 1038       Controlled Substance Prescriptions [place] Controlled Substance Registry consulted? Not Applicable   Christopher Haber, NP 06/24/17 1040

## 2017-06-24 NOTE — Discharge Instructions (Signed)
Push fluids to ensure adequate hydration. Tylenol and/or ibuprofen as needed for pain or fevers.  You do not need additional antibiotics after the injection we have given you today. Considered contagious for the next 24 hours.  Change out toothbrush in 24 hours.

## 2017-07-22 ENCOUNTER — Ambulatory Visit (HOSPITAL_COMMUNITY)
Admission: EM | Admit: 2017-07-22 | Discharge: 2017-07-22 | Disposition: A | Discharge status: Home / Self Care | Payer: Medicaid Other | Attending: Family Medicine | Admitting: Family Medicine

## 2017-07-22 ENCOUNTER — Encounter (HOSPITAL_COMMUNITY): Payer: Self-pay | Admitting: Emergency Medicine

## 2017-07-22 DIAGNOSIS — M25512 Pain in left shoulder: Secondary | ICD-10-CM | POA: Diagnosis not present

## 2017-07-22 MED ORDER — PREDNISONE 10 MG (21) PO TBPK: ORAL_TABLET | Freq: Every day | ORAL | 0 refills | Status: DC

## 2017-07-22 NOTE — ED Triage Notes (Signed)
PT reports left shoulder pain that has worsened over the past few weeks.

## 2017-07-22 NOTE — ED Provider Notes (Signed)
[facility]   [id number] 07/22/17 Arrival Time: 0957  ASSESSMENT & PLAN:  1. Left shoulder pain, unspecified chronicity   Questions rotator cuff involvement vs tendonitis.  Meds ordered this encounter  Medications  . predniSONE (STERAPRED UNI-PAK 21 TAB) 10 MG (21) TBPK tablet    Sig: Take by mouth daily. Take as directed.    Dispense:  21 tablet    Refill:  0   Will monitor blood sugars closely. Plans to schedule f/u with his PCP for further evaluation and imaging if needed.  Reviewed expectations re: course of current medical issues. Questions answered. Outlined signs and symptoms indicating need for more acute intervention. Patient verbalized understanding. After Visit Summary given.  SUBJECTIVE: History from: patient. Sung AmabileQuinton Michelini is a 42 y.o. male who reports localized mild pain of his left anterior shoulder that is stable; intermittent; described as sharp without radiation. Onset: gradual, several months ago. Injury/trama: no, but symptoms started after he began lifting weights a few months ago. Relieved by: rest. Worsened by: certain movements, including reaching for items above his head Associated symptoms: none reported. Extremity sensation changes or weakness: none. Self treatment: tried OTCs without relief of pain; ibuprofen. History of similar: no  ROS: As per HPI.   OBJECTIVE:  Vitals:   07/22/17 1009 07/22/17 1010  BP:  136/82  Pulse:  89  Resp:  16  Temp:  98.5 F (36.9 C)  TempSrc:  Oral  SpO2:  99%  Weight: 270 lb (122.5 kg)     General appearance: alert; no distress Extremities: no cyanosis or edema; symmetrical with no gross deformities; poorly localized tenderness over his left anterior shoulder with no swelling and no bruising; ROM: normal; increased discomfort with internal rotation CV: normal extremity capillary refill Skin: warm and dry Neurologic: normal gait; normal symmetric reflexes in all extremities; normal sensation  in all extremities Psychological: alert and cooperative; normal mood and affect  No Known Allergies  Past Medical History:  Diagnosis Date  . Diabetes mellitus    type 2  . GERD (gastroesophageal reflux disease)   . Hypertension    Social History   Socioeconomic History  . Marital status: Single    Spouse name: Not on file  . Number of children: Not on file  . Years of education: Not on file  . Highest education level: Not on file  Social Needs  . Financial resource strain: Not on file  . Food insecurity - worry: Not on file  . Food insecurity - inability: Not on file  . Transportation needs - medical: Not on file  . Transportation needs - non-medical: Not on file  Occupational History  . Not on file  Tobacco Use  . Smoking status: Current Some Day Smoker    Packs/day: 0.20    Types: Cigarettes  . Smokeless tobacco: Never Used  Substance and Sexual Activity  . Alcohol use: Yes    Comment: none now, once per week or less  . Drug use: No  . Sexual activity: Yes  Other Topics Concern  . Not on file  Social History Narrative  . Not on file   Family History  Problem Relation Age of Onset  . Diabetes Mother   . Diabetes Father    Past Surgical History:  Procedure Laterality Date  . ACHILLES TENDON SURGERY Left 07/18/2015   Procedure: LEFT ACHILLES TENDON DIRECT PRIMARY REPAIR;  Surgeon: Kathryne Hitchhristopher Y Blackman, MD;  [facility];  Service: Orthopedics;  Laterality: Left;  . NO  PAST SURGERIES        Mardella Layman, MD 07/22/17 1025

## 2017-08-06 ENCOUNTER — Ambulatory Visit: Payer: Medicaid Other | Admitting: Gastroenterology

## 2017-08-06 ENCOUNTER — Encounter (INDEPENDENT_AMBULATORY_CARE_PROVIDER_SITE_OTHER): Payer: Self-pay

## 2017-08-06 ENCOUNTER — Encounter: Payer: Self-pay | Admitting: Gastroenterology

## 2017-08-06 ENCOUNTER — Other Ambulatory Visit: Payer: Self-pay

## 2017-08-06 VITALS — BP 142/89 | HR 82 | Ht 76.0 in | Wt 284.0 lb

## 2017-08-06 DIAGNOSIS — E119 Type 2 diabetes mellitus without complications: Secondary | ICD-10-CM | POA: Insufficient documentation

## 2017-08-06 DIAGNOSIS — K921 Melena: Secondary | ICD-10-CM | POA: Diagnosis not present

## 2017-08-06 DIAGNOSIS — E118 Type 2 diabetes mellitus with unspecified complications: Secondary | ICD-10-CM | POA: Diagnosis not present

## 2017-08-06 NOTE — Progress Notes (Signed)
Christopher Repress, MD [address]  [address]  [place], [place] [address]  Main: [phone]  Fax: [phone]    Gastroenterology Consultation  Referring Provider:     Abram Sander, MD Primary Care Physician:  Ulanda Edison, MD Primary Gastroenterologist:  Dr. Arlyss Abbott Reason for Consultation:  Rectal bleeding        HPI:   Christopher Abbott is a 42 y.o. male referred by Dr. Ulanda Edison, MD  for consultation & management of rectal bleeding. Patient reports that he has been having episodes of intermittent, painless bleeding per rectum for the last 1 year, in last 1-2 months it has become more frequent and almost on a daily basis. He has been noticing blood on wiping and sometimes mixed with stool. He has one to 2 bowel movements daily, formed. He denies abdominal pain, nausea, vomiting, weight loss, fecal urgency, fecal incontinence. He denies any perianal lesions. He reports that his brother and father both have inflammatory bowel disease. Patient has history of diabetes, currently on insulin, last A1c 11.1. He denies any upper GI symptoms. He denies IV drug abuse. HIV status unknown, although he acknowledges that he practices sex with single male sexual partner and does not use any kind of contraception.  NSAIDs: none  Antiplts/Anticoagulants/Anti thrombotics: none  GI Procedures: none No prior GI surgeries Family history of inflammatory bowel disease in father and brother, denies family history of GI malignancy  Past Medical History:  Diagnosis Date  . Diabetes mellitus    type 2  . GERD (gastroesophageal reflux disease)   . Hypertension     Past Surgical History:  Procedure Laterality Date  . ACHILLES TENDON SURGERY Left 07/18/2015   Procedure: LEFT ACHILLES TENDON DIRECT PRIMARY REPAIR;  Surgeon: Kathryne Hitch, MD;  Location: [facility] OR;  Service: Orthopedics;  Laterality: Left;  . NO PAST SURGERIES       Current Outpatient Medications:  .   esomeprazole (NEXIUM) 40 MG capsule, Take 40 mg by mouth daily at 12 noon., Disp: , Rfl:  .  hydrocortisone (ANUSOL-HC) 2.5 % rectal cream, Apply rectally 2 times daily, Disp: 28 g, Rfl: 2 .  ibuprofen (ADVIL,MOTRIN) 600 MG tablet, Take 1 tablet (600 mg total) by mouth every 6 (six) hours as needed., Disp: 30 tablet, Rfl: 0 .  insulin glargine (LANTUS) 100 UNIT/ML injection, Inject 70 Units into the skin daily. , Disp: , Rfl:  .  Liraglutide (VICTOZA) 18 MG/3ML SOPN, Inject 1.8 mg into the skin daily., Disp: , Rfl:  .  lisinopril-hydrochlorothiazide (PRINZIDE,ZESTORETIC) 20-12.5 MG tablet, Take 1 tablet by mouth daily. for high blood pressure, Disp: , Rfl: 5 .  metFORMIN (GLUCOPHAGE) 1000 MG tablet, Take 1,000 mg by mouth 2 (two) times daily with a meal., Disp: , Rfl:  .  pravastatin (PRAVACHOL) 40 MG tablet, Take 40 mg by mouth every evening. for cholesterol, Disp: , Rfl: 1 .  predniSONE (STERAPRED UNI-PAK 21 TAB) 10 MG (21) TBPK tablet, Take by mouth daily. Take as directed., Disp: 21 tablet, Rfl: 0 .  JANUMET XR [phone] MG TB24, Take 1 tablet by mouth daily., Disp: , Rfl: 3   Family History  Problem Relation Age of Onset  . Diabetes Mother   . Diabetes Father      Social History   Tobacco Use  . Smoking status: Current Some Day Smoker    Packs/day: 0.20    Types: Cigarettes  . Smokeless tobacco: Never Used  Substance Use Topics  .  Alcohol use: Yes    Comment: none now, once per week or less  . Drug use: No    Allergies as of 08/06/2017  . (No Known Allergies)    Review of Systems:    All systems reviewed and negative except where noted in HPI.   Physical Exam:  BP (!) 142/89   Pulse 82   Ht 6\' 4"  (1.93 m)   Wt 284 lb (128.8 kg)   BMI 34.57 kg/m  No LMP for male patient.  General:   Alert, tall, Well-developed, well-nourished, pleasant and cooperative in NAD Head:  Normocephalic and atraumatic. Eyes:  Sclera clear, no icterus.   Conjunctiva pink. Ears:  Normal  auditory acuity. Nose:  No deformity, discharge, or lesions. Mouth:  No deformity or lesions,oropharynx pink & moist. Neck:  Supple; no masses or thyromegaly. Lungs:  Respirations even and unlabored.  Clear throughout to auscultation.   No wheezes, crackles, or rhonchi. No acute distress. Heart:  Regular rate and rhythm; no murmurs, clicks, rubs, or gallops. Abdomen:  Normal bowel sounds. Soft, non-tender and non-distended without masses, hepatosplenomegaly or hernias noted.  No guarding or rebound tenderness.   Rectal: Not performed Msk:  Symmetrical without gross deformities. Good, equal movement & strength bilaterally. Pulses:  Normal pulses noted. Extremities:  No clubbing or edema.  No cyanosis. Neurologic:  Alert and oriented x3;  grossly normal neurologically. Skin:  Intact without significant lesions or rashes. No jaundice. Psych:  Alert and cooperative. Normal mood and affect.  Imaging Studies: No abdominal imaging  Assessment and Plan:   Christopher Abbott is a 42 y.o. African-American male with 1 year history of intermittent painless rectal bleeding. And, he has family history of IBD. His symptoms are mostly consistent with outlet bleeding. However, given his family history of inflammatory bowel disease, I recommend colonoscopy. He does not have evidence of anemia. I also discussed with him about management of hemorrhoids including medical therapies and outpatient hemorrhoid ligation after the colonoscopy if there is no evidence of inflammatory bowel disease or colorectal cancer  I have discussed alternative options, risks & benefits,  which include, but are not limited to, bleeding, infection, perforation,respiratory complication & drug reaction.  The patient agrees with this plan & written consent will be obtained.    Follow up in clinic based on the colonoscopy results   Christopher Repressohini R Vanga, MD

## 2017-08-11 ENCOUNTER — Ambulatory Visit (HOSPITAL_COMMUNITY)
Admission: EM | Admit: 2017-08-11 | Discharge: 2017-08-11 | Disposition: A | Discharge status: Home / Self Care | Payer: Medicaid Other | Attending: Urgent Care | Admitting: Urgent Care

## 2017-08-11 ENCOUNTER — Encounter (HOSPITAL_COMMUNITY): Payer: Self-pay | Admitting: Emergency Medicine

## 2017-08-11 DIAGNOSIS — B9789 Other viral agents as the cause of diseases classified elsewhere: Secondary | ICD-10-CM

## 2017-08-11 DIAGNOSIS — J069 Acute upper respiratory infection, unspecified: Secondary | ICD-10-CM | POA: Diagnosis not present

## 2017-08-11 DIAGNOSIS — Z794 Long term (current) use of insulin: Secondary | ICD-10-CM

## 2017-08-11 DIAGNOSIS — E119 Type 2 diabetes mellitus without complications: Secondary | ICD-10-CM

## 2017-08-11 MED ORDER — BENZONATATE 100 MG PO CAPS: 100.0000 mg | ORAL_CAPSULE | Freq: Three times a day (TID) | ORAL | 0 refills | Status: DC | PRN

## 2017-08-11 MED ORDER — CETIRIZINE HCL 10 MG PO TABS: 10.0000 mg | ORAL_TABLET | Freq: Every day | ORAL | 0 refills | Status: DC

## 2017-08-11 MED ORDER — HYDROCODONE-HOMATROPINE 5-1.5 MG/5ML PO SYRP: 5.0000 mL | ORAL_SOLUTION | Freq: Every evening | ORAL | 0 refills | Status: DC | PRN

## 2017-08-11 NOTE — ED Triage Notes (Signed)
Pt c/o dry cough, has been trying OTC meds without relief. Pt states "I dont feel sick, I feel good, just have this dry cough, and runny nose".

## 2017-08-11 NOTE — ED Provider Notes (Signed)
  MRN: [id number] DOB: 06/17/1975  Subjective:   Sung AmabileQuinton Vivian is a 42 y.o. male presenting for 2 day history of dry cough, scratchy throat. Has not tried any medications for relief. Denies fever, sinus pain, ear pain, sore throat, chest pain, n/v, abdominal pain. Denies smoking cigarettes but does smoke cigars. Has well controlled diabetes managed with insulin.  Lindbergh has No Known Allergies.  Mena GoesQuinton  has a past medical history of Diabetes mellitus, GERD (gastroesophageal reflux disease), and Hypertension. Also  has a past surgical history that includes No past surgeries and Achilles tendon surgery (Left, 07/18/2015).  Objective:   Vitals: BP (!) 142/81   Pulse (!) 102   Temp 98.5 F (36.9 C)   Resp 18   SpO2 99%   Physical Exam  Constitutional: He is oriented to person, place, and time. He appears well-developed and well-nourished.  HENT:  Right Ear: Tympanic membrane normal.  Left Ear: Tympanic membrane normal.  Nose: No sinus tenderness.  Mouth/Throat: Oropharynx is clear and moist.  Eyes: Right eye exhibits no discharge. Left eye exhibits no discharge.  Neck: Normal range of motion. Neck supple.  Cardiovascular: Normal rate, regular rhythm and intact distal pulses. Exam reveals no gallop and no friction rub.  No murmur heard. Pulmonary/Chest: No respiratory distress. He has no wheezes. He has no rales.  Lymphadenopathy:    He has no cervical adenopathy.  Neurological: He is alert and oriented to person, place, and time.  Skin: Skin is warm and dry.  Psychiatric: He has a normal mood and affect.   Assessment and Plan :   Viral URI with cough  Controlled type 2 diabetes mellitus without complication, with long-term current use of insulin (HCC)  Will manage supportively for viral type illness. Maintain diabetes management. Return-to-clinic precautions discussed, patient verbalized understanding.    Wallis BambergMani, Latavion Halls, [place]PA-C 08/11/17 2144

## 2017-08-11 NOTE — Discharge Instructions (Signed)
Hydrate well with at least 2 liters (1 gallon) of water daily.  °

## 2017-08-12 ENCOUNTER — Telehealth: Payer: Self-pay | Admitting: Gastroenterology

## 2017-08-12 ENCOUNTER — Other Ambulatory Visit: Payer: Self-pay

## 2017-08-12 MED ORDER — NA SULFATE-K SULFATE-MG SULF 17.5-3.13-1.6 GM/177ML PO SOLN: 1.0000 | Freq: Once | ORAL | 0 refills | Status: AC

## 2017-08-12 NOTE — Telephone Encounter (Signed)
Patient needs his prep called in ASAP  [facility] [place]

## 2017-08-14 ENCOUNTER — Encounter: Payer: Self-pay | Admitting: *Deleted

## 2017-08-14 ENCOUNTER — Ambulatory Visit: Payer: Medicaid Other | Admitting: Anesthesiology

## 2017-08-14 ENCOUNTER — Encounter
Admission: RE | Disposition: A | Discharge status: Home / Self Care | Payer: Self-pay | Source: Ambulatory Visit | Attending: Gastroenterology

## 2017-08-14 ENCOUNTER — Ambulatory Visit
Admission: RE | Admit: 2017-08-14 | Discharge: 2017-08-14 | Disposition: A | Discharge status: Home / Self Care | Payer: Medicaid Other | Source: Ambulatory Visit | Attending: Gastroenterology | Admitting: Gastroenterology

## 2017-08-14 DIAGNOSIS — K921 Melena: Secondary | ICD-10-CM | POA: Diagnosis not present

## 2017-08-14 DIAGNOSIS — F17210 Nicotine dependence, cigarettes, uncomplicated: Secondary | ICD-10-CM | POA: Diagnosis not present

## 2017-08-14 DIAGNOSIS — I10 Essential (primary) hypertension: Secondary | ICD-10-CM | POA: Diagnosis not present

## 2017-08-14 DIAGNOSIS — Z794 Long term (current) use of insulin: Secondary | ICD-10-CM | POA: Insufficient documentation

## 2017-08-14 DIAGNOSIS — Z79899 Other long term (current) drug therapy: Secondary | ICD-10-CM | POA: Diagnosis not present

## 2017-08-14 DIAGNOSIS — K625 Hemorrhage of anus and rectum: Secondary | ICD-10-CM | POA: Diagnosis present

## 2017-08-14 DIAGNOSIS — E119 Type 2 diabetes mellitus without complications: Secondary | ICD-10-CM | POA: Insufficient documentation

## 2017-08-14 DIAGNOSIS — K648 Other hemorrhoids: Secondary | ICD-10-CM | POA: Insufficient documentation

## 2017-08-14 DIAGNOSIS — K219 Gastro-esophageal reflux disease without esophagitis: Secondary | ICD-10-CM | POA: Insufficient documentation

## 2017-08-14 HISTORY — PX: COLONOSCOPY WITH PROPOFOL: SHX5780

## 2017-08-14 LAB — GLUCOSE, CAPILLARY: Glucose-Capillary: 99 mg/dL (ref 65–99)

## 2017-08-14 SURGERY — COLONOSCOPY WITH PROPOFOL
Anesthesia: General

## 2017-08-14 MED ORDER — LIDOCAINE HCL (CARDIAC) 20 MG/ML IV SOLN
INTRAVENOUS | Status: DC | PRN
  Administered 2017-08-14: 40 mg via INTRAVENOUS

## 2017-08-14 MED ORDER — SODIUM CHLORIDE 0.9 % IV SOLN
INTRAVENOUS | Status: DC
  Administered 2017-08-14: 10:00:00 via INTRAVENOUS

## 2017-08-14 MED ORDER — MIDAZOLAM HCL 2 MG/2ML IJ SOLN
INTRAMUSCULAR | Status: DC | PRN
  Administered 2017-08-14: 2 mg via INTRAVENOUS

## 2017-08-14 MED ORDER — PROPOFOL 500 MG/50ML IV EMUL
INTRAVENOUS | Status: DC | PRN
  Administered 2017-08-14: 160 ug/kg/min via INTRAVENOUS

## 2017-08-14 MED ORDER — PROPOFOL 500 MG/50ML IV EMUL
INTRAVENOUS | Status: AC
  Filled 2017-08-14: qty 50

## 2017-08-14 MED ORDER — PHENYLEPHRINE HCL 10 MG/ML IJ SOLN
INTRAMUSCULAR | Status: DC | PRN
  Administered 2017-08-14: 100 ug via INTRAVENOUS
  Administered 2017-08-14: 150 ug via INTRAVENOUS

## 2017-08-14 MED ORDER — MIDAZOLAM HCL 2 MG/2ML IJ SOLN
INTRAMUSCULAR | Status: AC
  Filled 2017-08-14: qty 2

## 2017-08-14 MED ORDER — GLYCOPYRROLATE 0.2 MG/ML IJ SOLN
INTRAMUSCULAR | Status: AC
  Filled 2017-08-14: qty 1

## 2017-08-14 MED ORDER — PROPOFOL 10 MG/ML IV BOLUS
INTRAVENOUS | Status: DC | PRN
  Administered 2017-08-14: 30 mg via INTRAVENOUS
  Administered 2017-08-14: 20 mg via INTRAVENOUS
  Administered 2017-08-14: 70 mg via INTRAVENOUS

## 2017-08-14 NOTE — Op Note (Signed)
[facility] Gastroenterology Patient Name: Christopher Abbott Procedure Date: 08/14/2017 11:09 AM MRN: [id number] Account #: [id number] Date of Birth: 07-15-1975 Admit Type: Outpatient Age: 42 Room: [facility] ENDO ROOM 2 Gender: Male Note Status: Finalized Procedure:            Colonoscopy Indications:          Rectal bleeding Providers:            Lin Landsman MD, MD Referring MD:         [facility]arton Dubois (Referring MD) Medicines:            Monitored Anesthesia Care Complications:        No immediate complications. Estimated blood loss: None. Procedure:            Pre-Anesthesia Assessment:                       - Prior to the procedure, a History and Physical was                        performed, and patient medications and allergies were                        reviewed. The patient is competent. The risks and                        benefits of the procedure and the sedation options and                        risks were discussed with the patient. All questions                        were answered and informed consent was obtained.                        Patient identification and proposed procedure were                        verified by the physician, the nurse, the                        anesthesiologist, the anesthetist and the technician in                        the pre-procedure area in the procedure room in the                        endoscopy suite. Mental Status Examination: alert and                        oriented. Airway Examination: normal oropharyngeal                        airway and neck mobility. Respiratory Examination:                        clear to auscultation. CV Examination: normal.                        Prophylactic Antibiotics: The patient does not require  prophylactic antibiotics. Prior Anticoagulants: The                        patient has taken no previous anticoagulant or                         antiplatelet agents. ASA Grade Assessment: II - A                        patient with mild systemic disease. After reviewing the                        risks and benefits, the patient was deemed in                        satisfactory condition to undergo the procedure. The                        anesthesia plan was to use monitored anesthesia care                        (MAC). Immediately prior to administration of                        medications, the patient was re-assessed for adequacy                        to receive sedatives. The heart rate, respiratory rate,                        oxygen saturations, blood pressure, adequacy of                        pulmonary ventilation, and response to care were                        monitored throughout the procedure. The physical status                        of the patient was re-assessed after the procedure.                       After obtaining informed consent, the colonoscope was                        passed under direct vision. Throughout the procedure,                        the patient's blood pressure, pulse, and oxygen                        saturations were monitored continuously. The                        Colonoscope was introduced through the anus and                        advanced to the the cecum, identified by appendiceal  orifice and ileocecal valve. The colonoscopy was                        performed without difficulty. The patient tolerated the                        procedure well. The quality of the bowel preparation                        was evaluated using the BBPS [facility] Bowel Preparation                        Scale) with scores of: Right Colon = 3, Transverse                        Colon = 3 and Left Colon = 3 (entire mucosa seen well                        with no residual staining, small fragments of stool or                        opaque liquid). The total BBPS score equals  9. Findings:      The digital rectal exam findings include non-thrombosed internal       hemorrhoids. Pertinent negatives include normal sphincter tone.      The colon (entire examined portion) appeared normal.      Non-bleeding internal hemorrhoids were found during retroflexion. The       hemorrhoids were large. Impression:           - Non-thrombosed internal hemorrhoids found on digital                        rectal exam.                       - The entire examined colon is normal.                       - Non-bleeding internal hemorrhoids.                       - No specimens collected. Recommendation:       - Discharge patient to home.                       - Resume previous diet today.                       - Continue present medications.                       - Repeat colonoscopy at age 86years for surveillance.                       - Return to my office PRN. Procedure Code(s):    --- Professional ---                       [phone], Colonoscopy, flexible; diagnostic, including                        collection of specimen(s) by  brushing or washing, when                        performed (separate procedure) Diagnosis Code(s):    --- Professional ---                       K64.8, Other hemorrhoids                       K62.5, Hemorrhage of anus and rectum CPT copyright 2016 American Medical Association. All rights reserved. The codes documented in this report are preliminary and upon coder review may  be revised to meet current compliance requirements. Dr. Ulyess Mort Lin Landsman MD, MD 08/14/2017 11:40:00 AM This report has been signed electronically. Number of Addenda: 0 Note Initiated On: 08/14/2017 11:09 AM Scope Withdrawal Time: 0 hours 7 minutes 49 seconds  Total Procedure Duration: 0 hours 15 minutes 53 seconds       [facility]

## 2017-08-14 NOTE — Transfer of Care (Signed)
Immediate Anesthesia Transfer of Care Note  Patient: Christopher Abbott  Procedure(s) Performed: COLONOSCOPY WITH PROPOFOL (N/A )  Patient Location: PACU  Anesthesia Type:General  Level of Consciousness: awake, alert  and oriented  Airway & Oxygen Therapy: Patient Spontanous Breathing and Patient connected to nasal cannula oxygen  Post-op Assessment: Report given to RN and Post -op Vital signs reviewed and stable  Post vital signs: Reviewed and stable  Last Vitals:  Vitals Value Taken Time  BP 94/63 08/14/2017 11:48 AM  Temp 36.1 C 08/14/2017 11:48 AM  Pulse 112 08/14/2017 11:48 AM  Resp 24 08/14/2017 11:48 AM  SpO2 95 % 08/14/2017 11:48 AM  Vitals shown include unvalidated device data.  Last Pain:  Vitals:   08/14/17 1142  TempSrc:   PainSc: Asleep         Complications: No apparent anesthesia complications

## 2017-08-14 NOTE — Anesthesia Postprocedure Evaluation (Signed)
Anesthesia Post Note  Patient: Christopher Abbott  Procedure(s) Performed: COLONOSCOPY WITH PROPOFOL (N/A )  Patient location during evaluation: Endoscopy Anesthesia Type: General Level of consciousness: awake and alert Pain management: pain level controlled Vital Signs Assessment: post-procedure vital signs reviewed and stable Respiratory status: spontaneous breathing, nonlabored ventilation and respiratory function stable Cardiovascular status: blood pressure returned to baseline and stable Postop Assessment: no apparent nausea or vomiting Anesthetic complications: no     Last Vitals:  Vitals Value Taken Time  BP    Temp    Pulse    Resp    SpO2      Last Pain:  Vitals:   08/14/17 1202  TempSrc:   PainSc: 0-No pain                 Christia ReadingScott T Cherina Dhillon

## 2017-08-14 NOTE — Anesthesia Preprocedure Evaluation (Signed)
Anesthesia Evaluation  Patient identified by MRN, date of birth, ID band Patient awake    Reviewed: Allergy & Precautions, H&P , NPO status , reviewed documented beta blocker date and time   Airway Mallampati: II       Dental  (+) Partial Upper   Pulmonary Current Smoker,    Pulmonary exam normal        Cardiovascular hypertension, Normal cardiovascular exam     Neuro/Psych    GI/Hepatic GERD  Controlled,  Endo/Other  diabetes  Renal/GU      Musculoskeletal   Abdominal   Peds  Hematology   Anesthesia Other Findings   Reproductive/Obstetrics                             Anesthesia Physical Anesthesia Plan  ASA: II  Anesthesia Plan: General   Post-op Pain Management:    Induction:   PONV Risk Score and Plan: 3 and Propofol infusion  Airway Management Planned:   Additional Equipment:   Intra-op Plan:   Post-operative Plan:   Informed Consent: I have reviewed the patients History and Physical, chart, labs and discussed the procedure including the risks, benefits and alternatives for the proposed anesthesia with the patient or authorized representative who has indicated his/her understanding and acceptance.   Dental Advisory Given  Plan Discussed with: CRNA  Anesthesia Plan Comments:         Anesthesia Quick Evaluation

## 2017-08-14 NOTE — Anesthesia Post-op Follow-up Note (Signed)
Anesthesia QCDR form completed.        

## 2017-08-14 NOTE — H&P (Signed)
Arlyss Repress, MD [address]  [address]  [place], [place] [address]  Main: [phone]  Fax: [phone] Pager: [phone]  Primary Care Physician:  Ulanda Edison, MD Primary Gastroenterologist:  Dr. Arlyss Repress  Pre-Procedure History & Physical: HPI:  Christopher Abbott is a 42 y.o. male is here for an colonoscopy.   Past Medical History:  Diagnosis Date  . Diabetes mellitus    type 2  . GERD (gastroesophageal reflux disease)   . Hypertension     Past Surgical History:  Procedure Laterality Date  . ACHILLES TENDON SURGERY Left 07/18/2015   Procedure: LEFT ACHILLES TENDON DIRECT PRIMARY REPAIR;  Surgeon: Kathryne Hitch, MD;  Location: [facility] OR;  Service: Orthopedics;  Laterality: Left;  . NO PAST SURGERIES      Prior to Admission medications   Medication Sig Start Date End Date Taking? Authorizing Provider  cetirizine (ZYRTEC ALLERGY) 10 MG tablet Take 1 tablet (10 mg total) by mouth daily. 08/11/17  Yes Wallis Bamberg, PA-C  esomeprazole (NEXIUM) 40 MG capsule Take 40 mg by mouth daily at 12 noon.   Yes [provider]  insulin glargine (LANTUS) 100 UNIT/ML injection Inject 70 Units into the skin daily.    Yes [provider]  Liraglutide (VICTOZA) 18 MG/3ML SOPN Inject 1.8 mg into the skin daily.   Yes [provider]  lisinopril-hydrochlorothiazide (PRINZIDE,ZESTORETIC) 20-12.5 MG tablet Take 1 tablet by mouth daily. for high blood pressure 07/02/15  Yes [provider]  metFORMIN (GLUCOPHAGE) 1000 MG tablet Take 1,000 mg by mouth 2 (two) times daily with a meal.   Yes [provider]  pravastatin (PRAVACHOL) 40 MG tablet Take 40 mg by mouth every evening. for cholesterol 05/30/15  Yes [provider]  benzonatate (TESSALON) 100 MG capsule Take 1-2 capsules (100-200 mg total) by mouth 3 (three) times daily as needed. 08/11/17   Wallis Bamberg, PA-C  HYDROcodone-homatropine [facility]) 5-1.5 MG/5ML syrup Take 5 mLs by  mouth at bedtime as needed. 08/11/17   Wallis Bamberg, PA-C  hydrocortisone (ANUSOL-HC) 2.5 % rectal cream Apply rectally 2 times daily 02/07/17   Elvina Sidle, MD  ibuprofen (ADVIL,MOTRIN) 600 MG tablet Take 1 tablet (600 mg total) by mouth every 6 (six) hours as needed. 06/24/17   Georgetta Haber, NP  JANUMET XR [id number] MG TB24 Take 1 tablet by mouth daily. 07/10/15   [provider]  predniSONE (STERAPRED UNI-PAK 21 TAB) 10 MG (21) TBPK tablet Take by mouth daily. Take as directed. 07/22/17   Mardella Layman, MD    Allergies as of 08/06/2017  . (No Known Allergies)    Family History  Problem Relation Age of Onset  . Diabetes Mother   . Diabetes Father     Social History   Socioeconomic History  . Marital status: Single    Spouse name: Not on file  . Number of children: Not on file  . Years of education: Not on file  . Highest education level: Not on file  Occupational History  . Not on file  Social Needs  . Financial resource strain: Not on file  . Food insecurity:    Worry: Not on file    Inability: Not on file  . Transportation needs:    Medical: Not on file    Non-medical: Not on file  Tobacco Use  . Smoking status: Current Some Day Smoker    Packs/day: 0.20    Types: Cigarettes  . Smokeless tobacco: Never Used  Substance  and Sexual Activity  . Alcohol use: Yes    Comment: none now, once per week or less  . Drug use: No  . Sexual activity: Yes  Lifestyle  . Physical activity:    Days per week: Not on file    Minutes per session: Not on file  . Stress: Not on file  Relationships  . Social connections:    Talks on phone: Not on file    Gets together: Not on file    Attends religious service: Not on file    Active member of club or organization: Not on file    Attends meetings of clubs or organizations: Not on file    Relationship status: Not on file  . Intimate partner violence:    Fear of current or ex partner: Not on file    Emotionally abused:  Not on file    Physically abused: Not on file    Forced sexual activity: Not on file  Other Topics Concern  . Not on file  Social History Narrative  . Not on file    Review of Systems: See HPI, otherwise negative ROS  Physical Exam: BP 135/78   Pulse 88   Temp (!) 97.1 F (36.2 C) (Tympanic)   Resp 20   Wt 270 lb (122.5 kg)   SpO2 98%   BMI 32.87 kg/m  General:   Alert,  pleasant and cooperative in NAD Head:  Normocephalic and atraumatic. Neck:  Supple; no masses or thyromegaly. Lungs:  Clear throughout to auscultation.    Heart:  Regular rate and rhythm. Abdomen:  Soft, nontender and nondistended. Normal bowel sounds, without guarding, and without rebound.   Neurologic:  Alert and  oriented x4;  grossly normal neurologically.  Impression/Plan: Sung AmabileQuinton Selman is here for an colonoscopy to be performed for rectal bleeding  Risks, benefits, limitations, and alternatives regarding  colonoscopy have been reviewed with the patient.  Questions have been answered.  All parties agreeable.   Lannette Donathohini Helyne Genther, MD  08/14/2017, 11:12 AM

## 2017-08-15 ENCOUNTER — Encounter: Payer: Self-pay | Admitting: Gastroenterology

## 2017-08-15 ENCOUNTER — Telehealth: Payer: Self-pay | Admitting: Gastroenterology

## 2017-08-15 NOTE — Telephone Encounter (Signed)
Left [facility] with pt [facility] note from Dr. Allegra Laivanga

## 2017-08-26 ENCOUNTER — Ambulatory Visit: Payer: Medicaid Other | Admitting: Gastroenterology

## 2017-08-29 ENCOUNTER — Encounter: Payer: Self-pay | Admitting: Gastroenterology

## 2017-08-29 ENCOUNTER — Ambulatory Visit: Payer: Medicaid Other | Admitting: Gastroenterology

## 2017-08-29 VITALS — BP 115/76 | HR 92 | Temp 98.7°F | Ht 76.0 in | Wt 286.4 lb

## 2017-08-29 DIAGNOSIS — K648 Other hemorrhoids: Secondary | ICD-10-CM | POA: Insufficient documentation

## 2017-08-29 DIAGNOSIS — K640 First degree hemorrhoids: Secondary | ICD-10-CM

## 2017-08-29 NOTE — Progress Notes (Signed)
PROCEDURE NOTE: The patient presents with symptomatic grade 1 hemorrhoids, unresponsive to maximal medical therapy, requesting rubber band ligation of his/her hemorrhoidal disease.  All risks, benefits and alternative forms of therapy were described and informed consent was obtained.  In the Left Lateral Decubitus position (if anoscopy is performed) anoscopic examination revealed grade 1 hemorrhoids in the all position(s).   The decision was made to band the RP internal hemorrhoid, and the [facility] System was used to perform band ligation without complication.  Digital anorectal examination was then performed to assure proper positioning of the band, and to adjust the banded tissue as required.  The patient was discharged home without pain or other issues.  Dietary and behavioral recommendations were given and (if necessary - prescriptions were given), along with follow-up instructions.  The patient will return 2 weeks for follow-up and possible additional banding as required.  Patient has history of diabetes, on insulin and metformin. He took lantus last night and did not eat since morning today. He started feeling dizzy, lightheaded associated with nausea and sweating after the procedure. Although, he denied rectal pain, cramps, he felt like having a bowel movement and some rectal pressure. So, I readjusted the band given his symptoms. His blood pressure decreased after the procedure. He was given soft drinks and crackers with peanut butter. Vitals rechecked prior to check out. Vitals normal. Pt was asymptomatic    Arlyss Repressohini R Vanga, MD [address]  [address]  [place], [place] [address]  Main: [phone]  Fax: [phone] Pager: [phone]

## 2017-09-06 ENCOUNTER — Ambulatory Visit (HOSPITAL_COMMUNITY)
Admission: EM | Admit: 2017-09-06 | Discharge: 2017-09-06 | Disposition: A | Discharge status: Home / Self Care | Payer: Medicaid Other | Attending: Family Medicine | Admitting: Family Medicine

## 2017-09-06 ENCOUNTER — Other Ambulatory Visit: Payer: Self-pay

## 2017-09-06 ENCOUNTER — Encounter (HOSPITAL_COMMUNITY): Payer: Self-pay

## 2017-09-06 DIAGNOSIS — K0889 Other specified disorders of teeth and supporting structures: Secondary | ICD-10-CM

## 2017-09-06 MED ORDER — OXYCODONE-ACETAMINOPHEN 5-325 MG PO TABS: 1.0000 | ORAL_TABLET | Freq: Three times a day (TID) | ORAL | 0 refills | Status: DC | PRN

## 2017-09-06 MED ORDER — IBUPROFEN 600 MG PO TABS: 600.0000 mg | ORAL_TABLET | Freq: Four times a day (QID) | ORAL | 0 refills | Status: DC | PRN

## 2017-09-06 NOTE — ED Provider Notes (Signed)
[facility]    CSN: [id number] Arrival date & time: 09/06/17  1200     History   Chief Complaint Chief Complaint  Patient presents with  . Dental Pain    HPI Christopher Abbott is a 42 y.o. male.   42 yo male here for tooth pain. Was seen by dentist and given Tylenol #3 and says it is not helping. A friend gave him hydrocodone and it "knocked out the pain." He is scheduled to see oral surgeon next week.      Past Medical History:  Diagnosis Date  . Diabetes mellitus    type 2  . GERD (gastroesophageal reflux disease)   . Hypertension     Patient Active Problem List   Diagnosis Date Noted  . Internal hemorrhoid, bleeding 08/29/2017  . Type 2 diabetes mellitus (HCC) 08/06/2017  . 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 Rupture of left Achilles tendon 07/18/2015    Past Surgical History:  Procedure Laterality Date  . ACHILLES TENDON SURGERY Left 07/18/2015   Procedure: LEFT ACHILLES TENDON DIRECT PRIMARY REPAIR;  Surgeon: Kathryne Hitch, MD;  Location: [facility];  Service: Orthopedics;  Laterality: Left;  . COLONOSCOPY WITH PROPOFOL N/A 08/14/2017   Procedure: COLONOSCOPY WITH PROPOFOL;  Surgeon: Toney Reil, MD;  Location: [facility];  Service: Gastroenterology;  Laterality: N/A;  . NO PAST SURGERIES         Home Medications    Prior to Admission medications   Medication Sig Start Date End Date Taking? Authorizing Provider  insulin glargine (LANTUS) 100 UNIT/ML injection Inject 70 Units into the skin daily.    Yes [provider]  JANUMET XR [phone] MG TB24 Take 1 tablet by mouth daily. 07/10/15  Yes [provider]  Liraglutide (VICTOZA) 18 MG/3ML SOPN Inject 1.8 mg into the skin daily.   Yes [provider]  lisinopril-hydrochlorothiazide (PRINZIDE,ZESTORETIC) 20-12.5 MG tablet Take 1 tablet by mouth daily. for high blood pressure 07/02/15  Yes [provider]  metFORMIN (GLUCOPHAGE) 1000 MG tablet Take  1,000 mg by mouth 2 (two) times daily with a meal.   Yes [provider]  pravastatin (PRAVACHOL) 40 MG tablet Take 40 mg by mouth every evening. for cholesterol 05/30/15  Yes [provider]  benzonatate (TESSALON) 100 MG capsule Take 1-2 capsules (100-200 mg total) by mouth 3 (three) times daily as needed. Patient not taking: Reported on 08/29/2017 08/11/17   Wallis Bamberg, PA-C  cetirizine (ZYRTEC ALLERGY) 10 MG tablet Take 1 tablet (10 mg total) by mouth daily. Patient not taking: Reported on 08/29/2017 08/11/17   Wallis Bamberg, PA-C  esomeprazole (NEXIUM) 40 MG capsule Take 40 mg by mouth daily at 12 noon.    [provider]  HYDROcodone-homatropine (HYCODAN) 5-1.5 MG/5ML syrup Take 5 mLs by mouth at bedtime as needed. Patient not taking: Reported on 08/29/2017 08/11/17   Wallis Bamberg, PA-C  hydrocortisone (ANUSOL-HC) 2.5 % rectal cream Apply rectally 2 times daily Patient not taking: Reported on 08/29/2017 02/07/17   Elvina Sidle, MD  ibuprofen (ADVIL,MOTRIN) 600 MG tablet Take 1 tablet (600 mg total) by mouth every 6 (six) hours as needed. 06/24/17   Georgetta Haber, NP  predniSONE (STERAPRED UNI-PAK 21 TAB) 10 MG (21) TBPK tablet Take by mouth daily. Take as directed. 07/22/17   Mardella Layman, MD    Family History Family History  Problem Relation Age of Onset  . Diabetes Mother   . Diabetes Father     Social History  Social History   Tobacco Use  . Smoking status: Current Some Day Smoker    Packs/day: 0.20    Types: Cigarettes  . Smokeless tobacco: Never Used  Substance Use Topics  . Alcohol use: Yes    Comment: none now, once per week or less  . Drug use: No     Allergies   Patient has no known allergies.   Review of Systems Review of Systems  Constitutional: Negative for activity change.  HENT: Positive for dental problem. Negative for congestion.   Eyes: Negative for discharge and itching.  Respiratory: Negative for apnea and chest tightness.     Cardiovascular: Negative for chest pain and leg swelling.  Gastrointestinal: Negative for abdominal distention and abdominal pain.  Endocrine: Negative for cold intolerance and heat intolerance.  Genitourinary: Negative for difficulty urinating and dysuria.  Musculoskeletal: Negative for arthralgias and back pain.  Neurological: Negative for dizziness and headaches.  Hematological: Negative for adenopathy. Does not bruise/bleed easily.     Physical Exam Triage Vital Signs ED Triage Vitals  Enc Vitals Group     BP 09/06/17 1212 136/85     Pulse Rate 09/06/17 1212 82     Resp 09/06/17 1212 16     Temp 09/06/17 1212 98.2 F (36.8 C)     Temp Source 09/06/17 1212 Oral     SpO2 09/06/17 1212 95 %     Weight 09/06/17 1213 275 lb (124.7 kg)     Height 09/06/17 1213 6\' 4"  (1.93 m)     Head Circumference --      Peak Flow --      Pain Score 09/06/17 1213 10     Pain Loc --      Pain Edu? --      Excl. in [place]? --    No data found.  Updated Vital Signs BP 136/85 (BP Location: Left Arm)   Pulse 82   Temp 98.2 F (36.8 C) (Oral)   Resp 16   Ht 6\' 4"  (1.93 m)   Wt 275 lb (124.7 kg)   SpO2 95%   BMI 33.47 kg/m   Visual Acuity Right Eye Distance:   Left Eye Distance:   Bilateral Distance:    Right Eye Near:   Left Eye Near:    Bilateral Near:     Physical Exam CONSTITUTIONAL: Well-developed, well-nourished male in no acute distress.  EYES: EOM intact, conjunctivae normal, no scleral icterus HEAD: Normocephalic, atraumatic ENT: External right and left ear normal, oropharynx is clear and moist. CARDIOVASCULAR: No cyanosis or edema. 2+ distal pulses.  RESPIRATORY:Effort and breath sounds normal, no problems with respiration noted. GASTROINTESTINAL:Soft, normal bowel sounds, no distention noted.  No tenderness, rebound or guarding.  MUSCULOSKELETAL: Normal range of motion. No tenderness. SKIN: Skin is warm and dry. No rash noted. Not diaphoretic. No erythema. No  pallor. NEUROLGIC: Alert and oriented to person, place, and time. Normal reflexes, muscle tone, coordination. No cranial nerve deficit noted. PSYCHIATRIC: Normal mood and affect. Normal behavior. Normal judgment and thought content. HEM/LYMPH/IMMUNOLOGIC: Neck supple, no masses.  Normal thyroid.   UC Treatments / Results  Labs (all labs ordered are listed, but only abnormal results are displayed) Labs Reviewed - No data to display  EKG None Radiology No results found.  Procedures Procedures (including critical care time)  Medications Ordered in UC Medications - No data to display   Initial Impression / Assessment and Plan / UC Course  I have reviewed the triage vital signs and  the nursing notes.  Pertinent labs & imaging results that were available during my care of the patient were reviewed by me and considered in my medical decision making (see chart for details).     1. Dental pain- will give 5 percocet and ibuprofen. Follow up as scheduled.  Final Clinical Impressions(s) / UC Diagnoses   Final diagnoses:  None    ED Discharge Orders    None       Controlled Substance Prescriptions [place] Controlled Substance Registry consulted? Not Applicable   Rolm Bookbinder, DO 09/06/17 1224

## 2017-09-06 NOTE — ED Triage Notes (Signed)
Pt complains of dental pain x 1 week. Was given pain medication until they could pull it out and states it is not working.

## 2017-09-15 ENCOUNTER — Other Ambulatory Visit: Payer: Self-pay

## 2017-09-16 ENCOUNTER — Encounter: Payer: Self-pay | Admitting: Gastroenterology

## 2017-09-16 ENCOUNTER — Encounter (INDEPENDENT_AMBULATORY_CARE_PROVIDER_SITE_OTHER): Payer: Self-pay

## 2017-09-16 ENCOUNTER — Ambulatory Visit: Payer: Medicaid Other | Admitting: Gastroenterology

## 2017-09-16 VITALS — BP 152/83 | HR 97 | Ht 76.0 in | Wt 284.4 lb

## 2017-09-16 DIAGNOSIS — K640 First degree hemorrhoids: Secondary | ICD-10-CM | POA: Diagnosis not present

## 2017-09-16 NOTE — Progress Notes (Signed)
PROCEDURE NOTE: The patient presents with symptomatic grade 1 hemorrhoids, unresponsive to maximal medical therapy, requesting rubber band ligation of his/her hemorrhoidal disease.  All risks, benefits and alternative forms of therapy were described and informed consent was obtained.    The decision was made to band the LL internal hemorrhoid, and the [facility] O'Regan System was used to perform band ligation without complication.  Digital anorectal examination was then performed to assure proper positioning of the band, and to adjust the banded tissue as required.  The patient was discharged home without pain or other issues.  Dietary and behavioral recommendations were given and (if necessary - prescriptions were given), along with follow-up instructions.  The patient will return 2 weeks for follow-up and possible additional banding as required.  No complications were encountered and the patient tolerated the procedure well.  Rohini R Vanga, MD [address]  [address]  [place], [place] [address]  Main: [phone]  Fax: [phone] Pager: [phone]    

## 2017-10-01 ENCOUNTER — Other Ambulatory Visit: Payer: Self-pay

## 2017-10-01 ENCOUNTER — Encounter: Payer: Self-pay | Admitting: Gastroenterology

## 2017-10-01 ENCOUNTER — Ambulatory Visit: Payer: Medicaid Other | Admitting: Gastroenterology

## 2017-10-01 VITALS — BP 131/82 | Wt 284.0 lb

## 2017-10-01 DIAGNOSIS — K640 First degree hemorrhoids: Secondary | ICD-10-CM

## 2017-10-01 NOTE — Progress Notes (Signed)
PROCEDURE NOTE: The patient presents with symptomatic grade 1 hemorrhoids, unresponsive to maximal medical therapy, requesting rubber band ligation of his/her hemorrhoidal disease.  All risks, benefits and alternative forms of therapy were described and informed consent was obtained.  The decision was made to band the RA internal hemorrhoid, and the [facility] O'Regan System was used to perform band ligation without complication.  Digital anorectal examination was then performed to assure proper positioning of the band, and to adjust the banded tissue as required.  The patient was discharged home without pain or other issues.  Dietary and behavioral recommendations were given and (if necessary - prescriptions were given), along with follow-up instructions.  The patient will return as needed for follow-up and possible additional banding as required.  No complications were encountered and the patient tolerated the procedure well.   Rohini R Vanga, MD [address]  [address]  [place], [place] [address]  Main: [phone]  Fax: [phone] Pager: [phone]    

## 2017-10-03 ENCOUNTER — Ambulatory Visit: Payer: Medicaid Other | Admitting: Gastroenterology

## 2017-10-09 ENCOUNTER — Telehealth: Payer: Self-pay | Admitting: Gastroenterology

## 2017-10-09 NOTE — Telephone Encounter (Signed)
Patient called to let you know that when he went to the bathroom today there was blood on the toilet tissue and a little in the stool. He would like to know if this is normal? Last banding 5/8  Please call.

## 2017-10-10 NOTE — Telephone Encounter (Signed)
Yeah, this is normal  RV

## 2017-10-13 NOTE — Telephone Encounter (Signed)
Patient Christopher Abbott that he is still waiting for a call back regarding blood in his stool

## 2017-10-14 NOTE — Telephone Encounter (Signed)
Christopher Abbott   Ask him to try anusol cream, continue fiber, sitz baths  Thanks  RV

## 2017-10-14 NOTE — Telephone Encounter (Signed)
Patient stated that he is experiencing blood in stool, some burning and irritation.  He recently underwent hemorrhoid banding.  I asked if he was taking a fiber supplement.  He said that he is not taking one at this time.  I asked him to begin taking a fiber supplement to help the ease of bowel movements.  Advised warm sitz baths.  He does not want to go through with another hemorrhoid banding.  He said its uncomfortable.  Please advise.  Thanks [employer]

## 2017-10-16 NOTE — Telephone Encounter (Signed)
LVM for pt to call me back.

## 2017-10-24 ENCOUNTER — Ambulatory Visit: Payer: Medicaid Other | Admitting: Gastroenterology

## 2018-03-09 ENCOUNTER — Ambulatory Visit (HOSPITAL_COMMUNITY)
Admission: EM | Admit: 2018-03-09 | Discharge: 2018-03-09 | Disposition: A | Discharge status: Home / Self Care | Payer: Medicaid Other | Attending: Family Medicine | Admitting: Family Medicine

## 2018-03-09 ENCOUNTER — Encounter (HOSPITAL_COMMUNITY): Payer: Self-pay

## 2018-03-09 DIAGNOSIS — J069 Acute upper respiratory infection, unspecified: Secondary | ICD-10-CM | POA: Diagnosis not present

## 2018-03-09 DIAGNOSIS — B9789 Other viral agents as the cause of diseases classified elsewhere: Secondary | ICD-10-CM | POA: Diagnosis not present

## 2018-03-09 MED ORDER — METHYLPREDNISOLONE ACETATE 80 MG/ML IJ SUSP
80.0000 mg | Freq: Once | INTRAMUSCULAR | Status: AC
  Administered 2018-03-09: 80 mg via INTRAMUSCULAR

## 2018-03-09 MED ORDER — METHYLPREDNISOLONE ACETATE 80 MG/ML IJ SUSP
INTRAMUSCULAR | Status: AC
  Filled 2018-03-09: qty 1

## 2018-03-09 MED ORDER — HYDROCODONE-HOMATROPINE 5-1.5 MG/5ML PO SYRP: 5.0000 mL | ORAL_SOLUTION | Freq: Four times a day (QID) | ORAL | 0 refills | Status: DC | PRN

## 2018-03-09 NOTE — ED Provider Notes (Signed)
[place]    CSN: [id number] Arrival date & time: 03/09/18  8466     History   Chief Complaint Chief Complaint  Patient presents with  . Cough    HPI Christopher Abbott is a 42 y.o. male.   Presents with persisting cough.  He has had this problem before with scratchy throat and persistent dry cough.  He has no nasal congestion or fever.  He has no history of asthma.  In the past he is received a shot and some cough medicine and this is worked well for him.  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     Past Medical History:  Diagnosis Date  . Diabetes mellitus    type 2  . GERD (gastroesophageal reflux disease)   . Hypertension     Patient Active Problem List   Diagnosis Date Noted  . Internal hemorrhoid, bleeding 08/29/2017  . Type 2 diabetes mellitus ([facility]) 08/06/2017  . 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 Rupture of left Achilles tendon 07/18/2015    Past Surgical History:  Procedure Laterality Date  . ACHILLES TENDON SURGERY Left 07/18/2015   Procedure: LEFT ACHILLES TENDON DIRECT PRIMARY REPAIR;  Surgeon: Mcarthur Rossetti, MD;  Location: [place];  Service: Orthopedics;  Laterality: Left;  . COLONOSCOPY WITH PROPOFOL N/A 08/14/2017   Procedure: COLONOSCOPY WITH PROPOFOL;  Surgeon: Lin Landsman, MD;  Location: [facility] ENDOSCOPY;  Service: Gastroenterology;  Laterality: N/A;  . NO PAST SURGERIES         Home Medications    Prior to Admission medications   Medication Sig Start Date End Date Taking? Authorizing Provider  acetaminophen-codeine (TYLENOL #3) 300-30 MG tablet Take 1 tablet by mouth every 6 (six) hours as needed. for pain 09/02/17  Yes [provider]  ACCU-CHEK FASTCLIX LANCETS MISC USE AS DIRECTED TO [place] DX: E11.9 08/28/17   [provider]  ACCU-CHEK GUIDE test strip USE AS DIRECTED TO TEST BLOOD SUGAR TWICE A DAY DX:E11.9 08/29/17   [provider]  B-D ULTRAFINE III SHORT PEN  31G X 8 MM MISC USE WITH INSULIN PEN INJECTIONS DAILY ( DX: E11.9) 09/17/17   [provider]  Blood Glucose Monitoring Suppl (ACCU-CHEK GUIDE) w/Device KIT USE AS DIRECTED DX E11.9 (MEDICAID PREFERRED) 08/28/17   [provider]  chlorhexidine (PERIDEX) 0.12 % solution RINSE WITH 1/2 CAPFUL AND SWISH FOR 30 SECONDS, THEN SPIT. USE TWICE A DAY 09/10/17   [provider]  HYDROcodone-acetaminophen (NORCO/VICODIN) 5-325 MG tablet Take 1 tablet by mouth every 6 (six) hours as needed. for pain 09/10/17   [provider]  HYDROcodone-homatropine (HYDROMET) 5-1.5 MG/5ML syrup Take 5 mLs by mouth every 6 (six) hours as needed for cough. 03/09/18   Robyn Haber, MD  hydrocortisone (ANUSOL-HC) 2.5 % rectal cream Apply rectally 2 times daily 02/07/17   Robyn Haber, MD  ibuprofen (ADVIL,MOTRIN) 400 MG tablet TAKE 1 TABLET BY MOUTH EVERY 4 HOURS 09/15/17   [provider]  ibuprofen (ADVIL,MOTRIN) 600 MG tablet Take 1 tablet (600 mg total) by mouth every 6 (six) hours as needed. 09/06/17   Moss, Amber, DO  ibuprofen (ADVIL,MOTRIN) 800 MG tablet Take 800 mg by mouth every 6 (six) hours as needed. for pain 08/29/17   [provider]  insulin glargine (LANTUS) 100 UNIT/ML injection Inject 70 Units into the skin daily.     [provider]  JANUMET XR [phone] MG TB24 Take 1 tablet by mouth daily.  07/10/15   [provider]  LANTUS SOLOSTAR 100 UNIT/ML Solostar Pen INJECT 45 UNITS INTO THE SKIN 4 TIMES A DAY 08/09/17   [provider]  Liraglutide (VICTOZA) 18 MG/3ML SOPN Inject 1.8 mg into the skin daily.    [provider]  lisinopril-hydrochlorothiazide (PRINZIDE,ZESTORETIC) 20-12.5 MG tablet Take 1 tablet by mouth daily. for high blood pressure 07/02/15   [provider]  metFORMIN (GLUCOPHAGE) 1000 MG tablet Take 1,000 mg by mouth 2 (two) times daily with a meal.    [provider]  oxyCODONE-acetaminophen  (PERCOCET/ROXICET) 5-325 MG tablet Take 1 tablet by mouth every 8 (eight) hours as needed for severe pain. 09/06/17   Moss, Amber, DO  pravastatin (PRAVACHOL) 40 MG tablet Take 40 mg by mouth every evening. for cholesterol 05/30/15   [provider]  SUPREP BOWEL PREP KIT 17.5-3.13-1.6 GM/177ML SOLN See admin instructions. 08/12/17   [provider]  Vitamin D, Ergocalciferol, (DRISDOL) 50000 units CAPS capsule TAKE ONE CAPSULE BY MOUTH ONCE A WEEK FOR 12 WEEKS 08/09/17   [provider]    Family History Family History  Problem Relation Age of Onset  . Diabetes Mother   . Diabetes Father     Social History Social History   Tobacco Use  . Smoking status: Current Some Day Smoker    Packs/day: 0.20    Types: Cigarettes  . Smokeless tobacco: Never Used  Substance Use Topics  . Alcohol use: Not Currently    Comment: none now, once per week or less  . Drug use: No     Allergies   Patient has no known allergies.   Review of Systems Review of Systems   Physical Exam Triage Vital Signs ED Triage Vitals  Enc Vitals Group     BP 03/09/18 0904 (!) 141/84     Pulse Rate 03/09/18 0904 81     Resp 03/09/18 0904 16     Temp 03/09/18 0904 98.2 F (36.8 C)     Temp Source 03/09/18 0904 Oral     SpO2 03/09/18 0904 98 %     Weight --      Height --      Head Circumference --      Peak Flow --      Pain Score 03/09/18 0908 0     Pain Loc --      Pain Edu? --      Excl. in [place]? --    No data found.  Updated Vital Signs BP (!) 141/84 (BP Location: Left Arm)   Pulse 81   Temp 98.2 F (36.8 C) (Oral)   Resp 16   SpO2 98%    Physical Exam  Constitutional: He is oriented to person, place, and time. He appears well-developed and well-nourished.  HENT:  Right Ear: External ear normal.  Left Ear: External ear normal.  Mouth/Throat: Oropharynx is clear and moist.  Eyes: Pupils are equal, round, and reactive to light. Conjunctivae are normal.  Neck:  Normal range of motion. Neck supple.  Cardiovascular: Normal rate, regular rhythm and normal heart sounds.  Pulmonary/Chest: Effort normal and breath sounds normal.  Musculoskeletal: Normal range of motion.  Neurological: He is alert and oriented to person, place, and time.  Skin: Skin is warm and dry.  Nursing note and vitals reviewed.    UC Treatments / Results  Labs (all labs ordered are listed, but only abnormal results are displayed) Labs Reviewed - No data to display  EKG None  Radiology No results found.  Procedures Procedures (including critical care time)  Medications Ordered in UC Medications  methylPREDNISolone acetate (DEPO-MEDROL) injection 80 mg (has no administration in time range)    Initial Impression / Assessment and Plan / UC Course  I have reviewed the triage vital signs and the nursing notes.  Pertinent labs & imaging results that were available during my care of the patient were reviewed by me and considered in my medical decision making (see chart for details).    Final Clinical Impressions(s) / UC Diagnoses   Final diagnoses:  Viral URI with cough     Discharge Instructions     Please return if you are not getting better in the next 2 to 3 days.    ED Prescriptions    Medication Sig Dispense Auth. Provider   HYDROcodone-homatropine (HYDROMET) 5-1.5 MG/5ML syrup Take 5 mLs by mouth every 6 (six) hours as needed for cough. 60 mL Robyn Haber, MD     Controlled Substance Prescriptions [place] Controlled Substance Registry consulted? Not Applicable   Robyn Haber, MD 03/09/18 [phone]

## 2018-03-09 NOTE — Discharge Instructions (Addendum)
Please return if you are not getting better in the next 2 to 3 days.

## 2018-03-09 NOTE — ED Triage Notes (Signed)
Pt presents with persistent cough. 

## 2018-05-06 ENCOUNTER — Ambulatory Visit (INDEPENDENT_AMBULATORY_CARE_PROVIDER_SITE_OTHER): Payer: Medicaid Other

## 2018-05-06 ENCOUNTER — Encounter (HOSPITAL_COMMUNITY): Payer: Self-pay

## 2018-05-06 ENCOUNTER — Ambulatory Visit (HOSPITAL_COMMUNITY)
Admission: EM | Admit: 2018-05-06 | Discharge: 2018-05-06 | Disposition: A | Discharge status: Home / Self Care | Payer: Medicaid Other | Attending: Emergency Medicine | Admitting: Emergency Medicine

## 2018-05-06 DIAGNOSIS — R059 Cough, unspecified: Secondary | ICD-10-CM

## 2018-05-06 DIAGNOSIS — R05 Cough: Secondary | ICD-10-CM | POA: Insufficient documentation

## 2018-05-06 IMAGING — DX DG CHEST 2V
2 series · 2 of 2 positions shown · non-contrast
Comparison: [DATE]

CLINICAL DATA: Dry cough

EXAM:
CHEST - 2 VIEW

[chest pa]
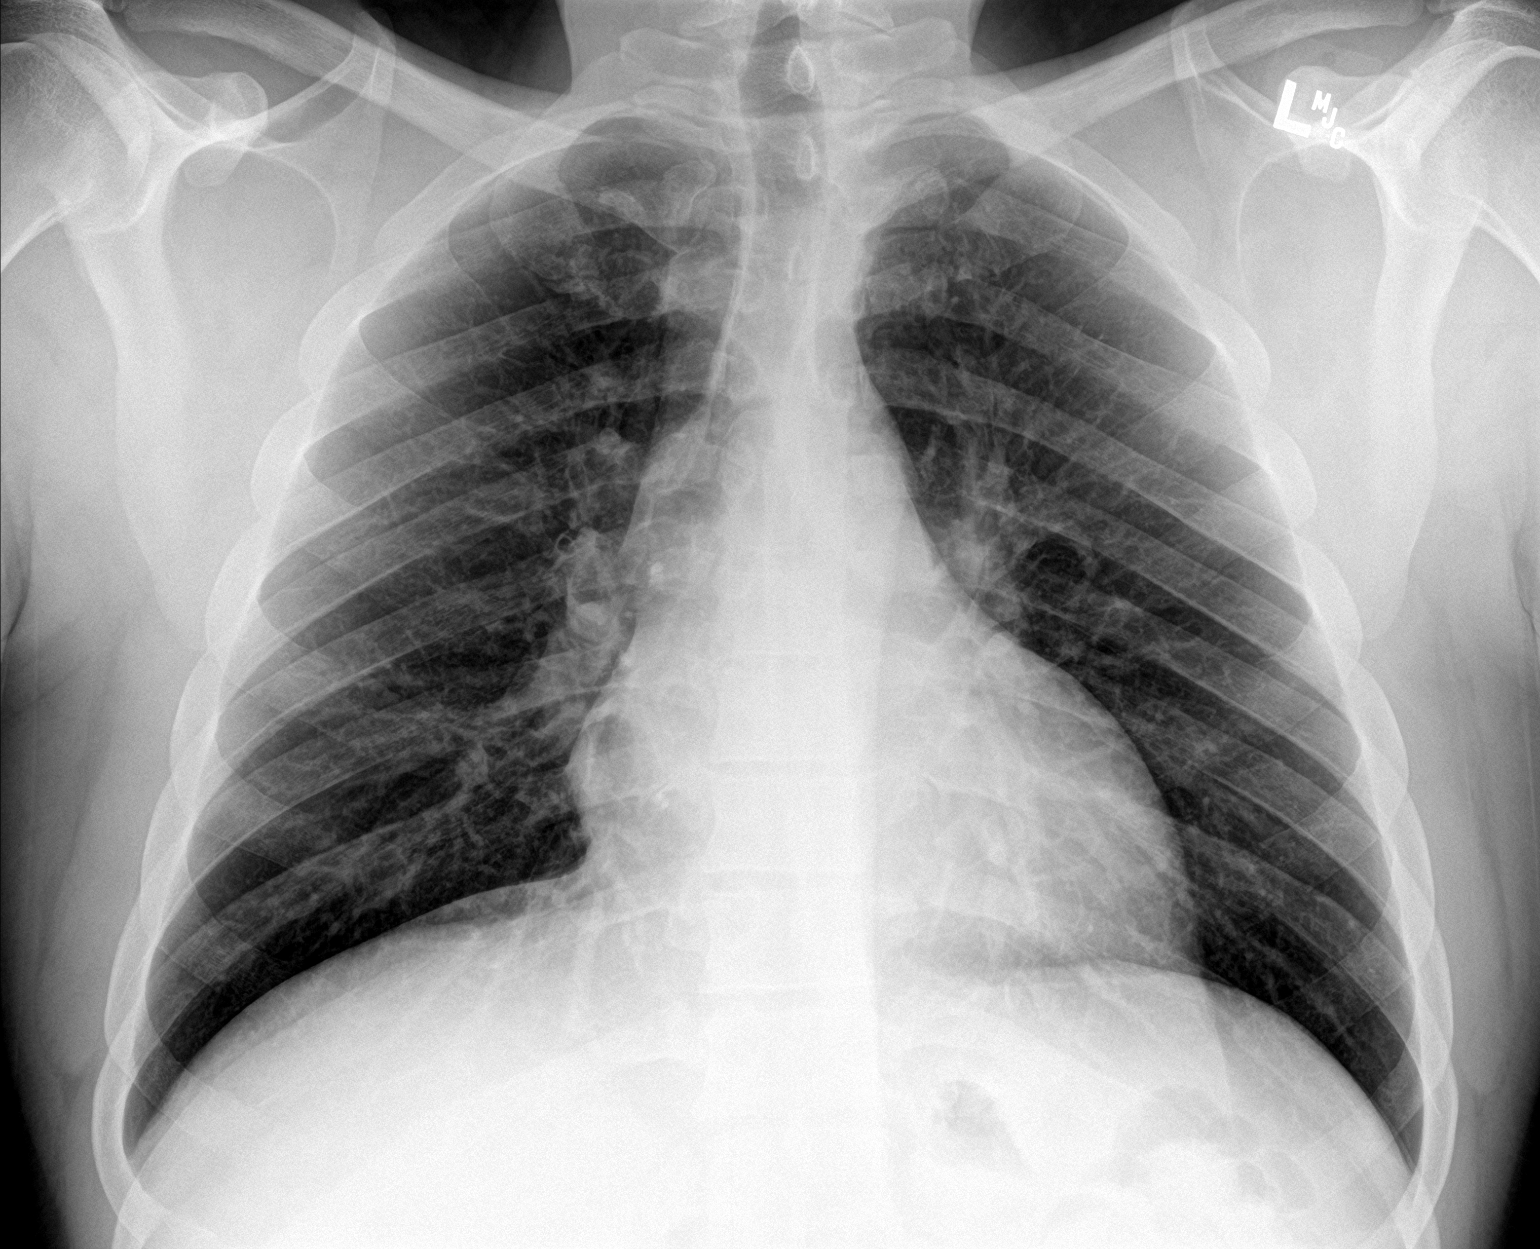

[chest lat]
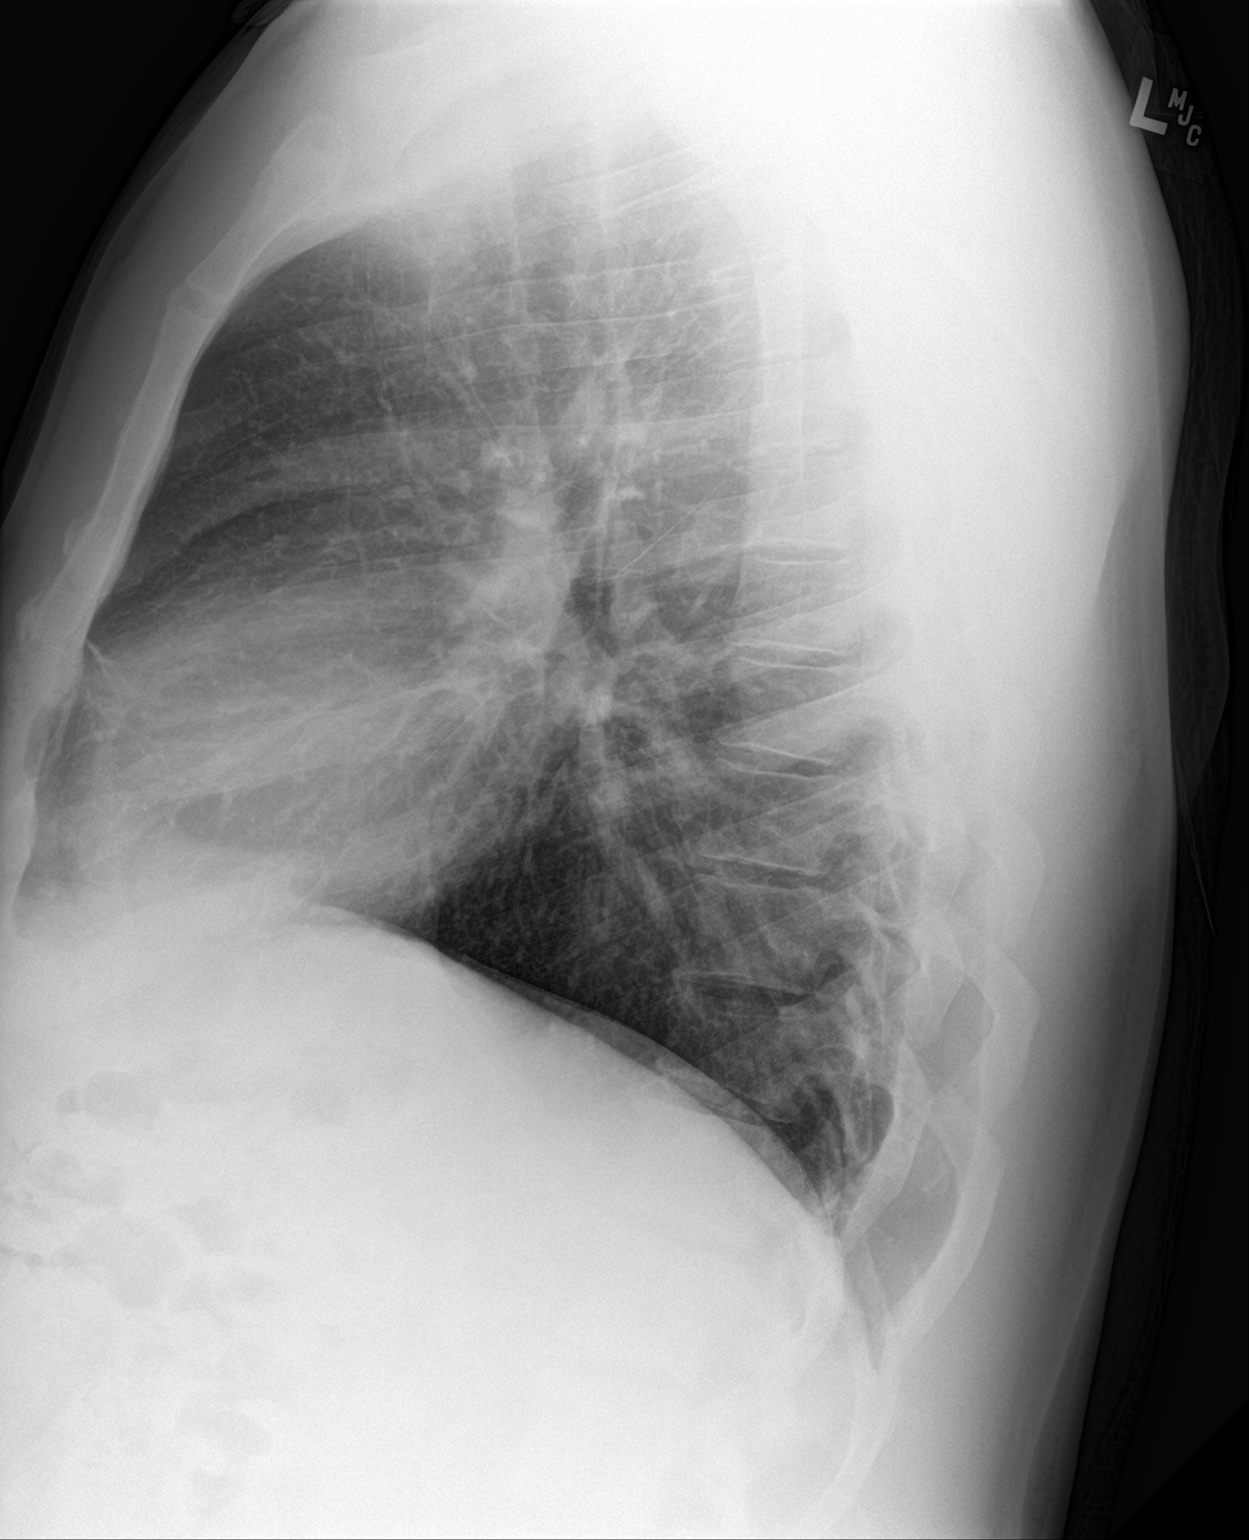

[2 of 2 positions shown; findings below may reference images not displayed]

FINDINGS: Heart and mediastinal contours are within normal limits. No focal
opacities or effusions. No acute bony abnormality.
IMPRESSION: No active cardiopulmonary disease.

## 2018-05-06 MED ORDER — HYDROCOD POLST-CPM POLST ER 10-8 MG/5ML PO SUER: 5.0000 mL | Freq: Two times a day (BID) | ORAL | 0 refills | Status: DC | PRN

## 2018-05-06 MED ORDER — BENZONATATE 200 MG PO CAPS: 200.0000 mg | ORAL_CAPSULE | Freq: Three times a day (TID) | ORAL | 0 refills | Status: DC | PRN

## 2018-05-06 MED ORDER — FLUTICASONE PROPIONATE 50 MCG/ACT NA SUSP: 2.0000 | Freq: Every day | NASAL | 0 refills | Status: DC

## 2018-05-06 NOTE — ED Provider Notes (Signed)
HPI  SUBJECTIVE:  Christopher Abbott is a 42 y.o. male who presents with 1 week of an itchy throat on the right side, dry cough.  Denies body aches, fevers, sore throat, GERD symptoms, allergy symptoms, nasal congestion, rhinorrhea, postnasal drip, sinus pain or pressure.  denies wheezing, chest pain, shortness of breath, dyspnea on exertion.  He tried a prescription cough syrup without improvement in his symptoms.  No aggravating factors.  His symptoms ae not associated with going out to the cold air, exercise, lying down.  Denies unintentional weight loss, night sweats, hemoptysis.  No unintentional weight gain, lower extremity edema, orthopnea, PND, nocturia.  States that he has been seen at this clinic for identical symptoms where he got an unknown shot and some cough medication.  He states that this helped his symptoms somewhat, but did not completely resolve them.  He has a past medical history of GERD, states that he is compliant with his medication.  He has a history of hypertension for which he takes an ACE inhibitor.  Also history of diabetes, hypercholesterolemia.  He is a smoker, smokes several cigars a day. No history of asthma, emphysema, COPD, vaping, CHF, cancer.,  PMD: Tomasita Morrow, MD  Patient was seen here back in October for similar symptoms, got a shot of Depo-Medrol and was sent home with cough syrup.  He was thought to have a viral URI.  Past Medical History:  Diagnosis Date  . Diabetes mellitus    type 2  . GERD (gastroesophageal reflux disease)   . Hypertension     Past Surgical History:  Procedure Laterality Date  . ACHILLES TENDON SURGERY Left 07/18/2015   Procedure: LEFT ACHILLES TENDON DIRECT PRIMARY REPAIR;  Surgeon: Mcarthur Rossetti, MD;  Location: [facility];  Service: Orthopedics;  Laterality: Left;  . COLONOSCOPY WITH PROPOFOL N/A 08/14/2017   Procedure: COLONOSCOPY WITH PROPOFOL;  Surgeon: Lin Landsman, MD;  Location: [facility] ENDOSCOPY;  Service:  Gastroenterology;  Laterality: N/A;  . NO PAST SURGERIES      Family History  Problem Relation Age of Onset  . Diabetes Mother   . Diabetes Father     Social History   Tobacco Use  . Smoking status: Current Some Day Smoker    Packs/day: 0.20    Types: Cigarettes  . Smokeless tobacco: Never Used  Substance Use Topics  . Alcohol use: Not Currently    Comment: none now, once per week or less  . Drug use: No    No current facility-administered medications for this encounter.   Current Outpatient Medications:  .  ACCU-CHEK FASTCLIX LANCETS MISC, USE AS DIRECTED TO CHECK BLOOD SUAGR DX: E11.9, Disp: , Rfl: 11 .  ACCU-CHEK GUIDE test strip, USE AS DIRECTED TO TEST BLOOD SUGAR TWICE A DAY DX:E11.9, Disp: , Rfl: 11 .  B-D ULTRAFINE III SHORT PEN 31G X 8 MM MISC, USE WITH INSULIN PEN INJECTIONS DAILY ( DX: E11.9), Disp: , Rfl: 11 .  benzonatate (TESSALON) 200 MG capsule, Take 1 capsule (200 mg total) by mouth 3 (three) times daily as needed for cough., Disp: 30 capsule, Rfl: 0 .  Blood Glucose Monitoring Suppl (ACCU-CHEK GUIDE) w/Device KIT, USE AS DIRECTED DX E11.9 (MEDICAID PREFERRED), Disp: , Rfl: 0 .  chlorhexidine (PERIDEX) 0.12 % solution, RINSE WITH 1/2 CAPFUL AND SWISH FOR 30 SECONDS, THEN SPIT. USE TWICE A DAY, Disp: , Rfl: 0 .  chlorpheniramine-HYDROcodone (TUSSIONEX PENNKINETIC ER) 10-8 MG/5ML SUER, Take 5 mLs by mouth every 12 (twelve) hours as  needed for cough., Disp: 60 mL, Rfl: 0 .  fluticasone (FLONASE) 50 MCG/ACT nasal spray, Place 2 sprays into both nostrils daily., Disp: 16 g, Rfl: 0 .  hydrocortisone (ANUSOL-HC) 2.5 % rectal cream, Apply rectally 2 times daily, Disp: 28 g, Rfl: 2 .  ibuprofen (ADVIL,MOTRIN) 600 MG tablet, Take 1 tablet (600 mg total) by mouth every 6 (six) hours as needed., Disp: 30 tablet, Rfl: 0 .  insulin glargine (LANTUS) 100 UNIT/ML injection, Inject 70 Units into the skin daily. , Disp: , Rfl:  .  JANUMET XR [phone] MG TB24, Take 1 tablet by  mouth daily., Disp: , Rfl: 3 .  LANTUS SOLOSTAR 100 UNIT/ML Solostar Pen, INJECT 45 UNITS INTO THE SKIN 4 TIMES A DAY, Disp: , Rfl: 3 .  Liraglutide (VICTOZA) 18 MG/3ML SOPN, Inject 1.8 mg into the skin daily., Disp: , Rfl:  .  lisinopril-hydrochlorothiazide (PRINZIDE,ZESTORETIC) 20-12.5 MG tablet, Take 1 tablet by mouth daily. for high blood pressure, Disp: , Rfl: 5 .  metFORMIN (GLUCOPHAGE) 1000 MG tablet, Take 1,000 mg by mouth 2 (two) times daily with a meal., Disp: , Rfl:  .  pravastatin (PRAVACHOL) 40 MG tablet, Take 40 mg by mouth every evening. for cholesterol, Disp: , Rfl: 1 .  SUPREP BOWEL PREP KIT 17.5-3.13-1.6 GM/177ML SOLN, See admin instructions., Disp: , Rfl: 0 .  Vitamin D, Ergocalciferol, (DRISDOL) 50000 units CAPS capsule, TAKE ONE CAPSULE BY MOUTH ONCE A WEEK FOR 12 WEEKS, Disp: , Rfl: 3  No Known Allergies   ROS  As noted in HPI.   Physical Exam  BP 124/86   Pulse 82   Temp 98.1 F (36.7 C) (Oral)   SpO2 95%   Constitutional: Well developed, well nourished, no acute distress Eyes:  EOMI, conjunctiva normal bilaterally HENT: Normocephalic, atraumatic,mucus membranes moist.  No nasal congestion.  Normal turbinates.  No sinus tenderness.  Normal oropharynx.  No obvious postnasal drip. Respiratory: Normal inspiratory effort lungs clear bilaterally, good air movement. Cardiovascular: Normal rate, regular rhythm, no murmurs, rubs, gallops GI: nondistended skin: No rash, skin intact Musculoskeletal: no deformities, no edema Neurologic: Alert & oriented x 3, no focal neuro deficits Psychiatric: Speech and behavior appropriate   ED Course   Medications - No data to display  Orders Placed This Encounter  Procedures  . DG Chest 2 View    Standing Status:   Standing    Number of Occurrences:   1    Order Specific Question:   Reason for Exam (SYMPTOM  OR DIAGNOSIS REQUIRED)    Answer:   recurrent cough    No results found for this or any previous visit (from  the past 24 hour(s)). Dg Chest 2 View  Result Date: 05/06/2018 CLINICAL DATA:  Dry cough EXAM: CHEST - 2 VIEW COMPARISON:  01/29/2014 FINDINGS: Heart and mediastinal contours are within normal limits. No focal opacities or effusions. No acute bony abnormality. IMPRESSION: No active cardiopulmonary disease. Electronically Signed   By: Rolm Baptise M.D.   On: 05/06/2018 10:31    ED Clinical Impression  Cough   ED Assessment/Plan  Suspect silent acid reflux or the lisinopril causing his cough.  There is no evidence of sinusitis.  Will check a chest x-ray because this is a recurring problem.  Doubt pneumonia, asthma.  While he got steroid injection last time, I do not think that it is necessary today in the absence of wheezing, shortness of breath, history of asthma.  Will send home with flonase in addition  toTessalon, Tussionex, and have him follow-up with his primary care physician.   Narcotic database reviewed for this patient, and feel that the risk/benefit ratio today is favorable for proceeding with a prescription for controlled substance.  Last opiate prescription was for Hydromet cough solution back in October.  Reviewed imaging independently.  Normal chest x-ray.  See radiology report for full details.  Discussed , imaging, MDM, treatment plan, and plan for follow-up with patient. patient agrees with plan.   Meds ordered this encounter  Medications  . fluticasone (FLONASE) 50 MCG/ACT nasal spray    Sig: Place 2 sprays into both nostrils daily.    Dispense:  16 g    Refill:  0  . benzonatate (TESSALON) 200 MG capsule    Sig: Take 1 capsule (200 mg total) by mouth 3 (three) times daily as needed for cough.    Dispense:  30 capsule    Refill:  0  . chlorpheniramine-HYDROcodone (TUSSIONEX PENNKINETIC ER) 10-8 MG/5ML SUER    Sig: Take 5 mLs by mouth every 12 (twelve) hours as needed for cough.    Dispense:  60 mL    Refill:  0    *This clinic note was created using Geographical information systems officer. Therefore, there may be occasional mistakes despite careful proofreading.   ?    Melynda Ripple, MD 05/07/18 1244

## 2018-05-06 NOTE — Discharge Instructions (Addendum)
Your chest x-ray was negative for pneumonia, mass concerning for cancer, or congestive heart failure.  I suspect that your cough is either coming from silent acid reflux or your blood pressure medication.  I am starting you on Flonase in case that this is from postnasal drip.  Tussionex is for the cough at night, Tessalon for the cough during the day.  Follow-up with your primary care physician as soon as you can.

## 2018-05-06 NOTE — ED Triage Notes (Signed)
Pt presents with Dry cough that has been going on for over a month.

## 2018-05-06 NOTE — ED Notes (Signed)
Patient able to ambulate independently  

## 2019-07-11 ENCOUNTER — Ambulatory Visit (HOSPITAL_COMMUNITY)
Admission: EM | Admit: 2019-07-11 | Discharge: 2019-07-11 | Disposition: A | Discharge status: Home / Self Care | Payer: Medicaid Other

## 2019-07-11 ENCOUNTER — Other Ambulatory Visit: Payer: Self-pay

## 2019-07-11 ENCOUNTER — Encounter (HOSPITAL_COMMUNITY): Payer: Self-pay | Admitting: Emergency Medicine

## 2019-07-11 DIAGNOSIS — L02214 Cutaneous abscess of groin: Secondary | ICD-10-CM | POA: Diagnosis not present

## 2019-07-11 MED ORDER — LIDOCAINE-EPINEPHRINE (PF) 2 %-1:200000 IJ SOLN
INTRAMUSCULAR | Status: AC
  Filled 2019-07-11: qty 20

## 2019-07-11 MED ORDER — SULFAMETHOXAZOLE-TRIMETHOPRIM 800-160 MG PO TABS: 1.0000 | ORAL_TABLET | Freq: Two times a day (BID) | ORAL | 0 refills | Status: AC

## 2019-07-11 NOTE — Discharge Instructions (Addendum)
I have sent an antiobiotic call bactrim, take this 2 times a day for 7 days  Clean the area daily and replace bandaging with nonstick gauze and tape of your choice, the tape applied today is called medipore  Small amounts of discharge are expected for the next few days. I would like for you to massage the area and try to dispell any more discharge possible of the next 1-2 days  If you notice continued swelling or non-healing over the next 1 week, please return for re-evaluation.  Tylenol and ibuprofen will work well for pain

## 2019-07-11 NOTE — ED Triage Notes (Signed)
Abscess over left groin for over a week.

## 2019-07-11 NOTE — ED Provider Notes (Signed)
[place]    CSN: [id number] Arrival date & time: 07/11/19  1002      History   Chief Complaint Chief Complaint  Patient presents with  . Abscess    HPI Christopher Abbott is a 44 y.o. male.   Patient reports to urgent care today for concern of abscess/bump in his left groin. He notes a "hair bump" starting in the left groin about a week ago. The area has become more swollen since then. He denies much pain in the area. He reports using Boil ease. No discharge from the area. No fevers, chills, penile discharge or testicular pain.   He has a history of boils in under his buttocks and right groin but it has been many years.   He has a history of insulin dependent diabetes.     Past Medical History:  Diagnosis Date  . Diabetes mellitus    type 2  . GERD (gastroesophageal reflux disease)   . Hypertension     Patient Active Problem List   Diagnosis Date Noted  . Internal hemorrhoid, bleeding 08/29/2017  . Type 2 diabetes mellitus ([place]) 08/06/2017  . 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 Rupture of left Achilles tendon 07/18/2015    Past Surgical History:  Procedure Laterality Date  . ACHILLES TENDON SURGERY Left 07/18/2015   Procedure: LEFT ACHILLES TENDON DIRECT PRIMARY REPAIR;  Surgeon: Mcarthur Rossetti, MD;  Location: [place];  Service: Orthopedics;  Laterality: Left;  . COLONOSCOPY WITH PROPOFOL N/A 08/14/2017   Procedure: COLONOSCOPY WITH PROPOFOL;  Surgeon: Lin Landsman, MD;  Location: [facility] ENDOSCOPY;  Service: Gastroenterology;  Laterality: N/A;  . NO PAST SURGERIES         Home Medications    Prior to Admission medications   Medication Sig Start Date End Date Taking? Authorizing Provider  esomeprazole (NEXIUM) 20 MG capsule Take 20 mg by mouth daily at 12 noon.   Yes [provider]  insulin glargine (LANTUS) 100 UNIT/ML injection Inject 70 Units into the skin daily.    Yes [provider]  Liraglutide (VICTOZA) 18  MG/3ML SOPN Inject 1.8 mg into the skin daily.   Yes [provider]  lisinopril-hydrochlorothiazide (PRINZIDE,ZESTORETIC) 20-12.5 MG tablet Take 1 tablet by mouth daily. for high blood pressure 07/02/15  Yes [provider]  metFORMIN (GLUCOPHAGE) 1000 MG tablet Take 1,000 mg by mouth 2 (two) times daily with a meal.   Yes [provider]  pravastatin (PRAVACHOL) 40 MG tablet Take 40 mg by mouth every evening. for cholesterol 05/30/15  Yes [provider]  ACCU-CHEK FASTCLIX LANCETS MISC USE AS DIRECTED TO [place] DX: E11.9 08/28/17   [provider]  ACCU-CHEK GUIDE test strip USE AS DIRECTED TO TEST BLOOD SUGAR TWICE A DAY DX:E11.9 08/29/17   [provider]  B-D ULTRAFINE III SHORT PEN 31G X 8 MM MISC USE WITH INSULIN PEN INJECTIONS DAILY ( DX: E11.9) 09/17/17   [provider]  benzonatate (TESSALON) 200 MG capsule Take 1 capsule (200 mg total) by mouth 3 (three) times daily as needed for cough. 05/06/18   Melynda Ripple, MD  Blood Glucose Monitoring Suppl (ACCU-CHEK GUIDE) w/Device KIT USE AS DIRECTED DX E11.9 (MEDICAID PREFERRED) 08/28/17   [provider]  chlorhexidine (PERIDEX) 0.12 % solution RINSE WITH 1/2 CAPFUL AND SWISH FOR 30 SECONDS, THEN SPIT. USE TWICE A DAY 09/10/17   [provider]  chlorpheniramine-HYDROcodone (TUSSIONEX PENNKINETIC ER) 10-8 MG/5ML SUER Take 5 mLs by mouth  every 12 (twelve) hours as needed for cough. 05/06/18   Melynda Ripple, MD  fluticasone (FLONASE) 50 MCG/ACT nasal spray Place 2 sprays into both nostrils daily. 05/06/18   Melynda Ripple, MD  hydrocortisone (ANUSOL-HC) 2.5 % rectal cream Apply rectally 2 times daily 02/07/17   Robyn Haber, MD  ibuprofen (ADVIL,MOTRIN) 600 MG tablet Take 1 tablet (600 mg total) by mouth every 6 (six) hours as needed. 09/06/17   Moss, Amber, DO  JANUMET XR [phone] MG TB24 Take 1 tablet by mouth daily. 07/10/15   [provider]   LANTUS SOLOSTAR 100 UNIT/ML Solostar Pen INJECT 45 UNITS INTO THE SKIN 4 TIMES A DAY 08/09/17   [provider]  sulfamethoxazole-trimethoprim (BACTRIM DS) 800-160 MG tablet Take 1 tablet by mouth 2 (two) times daily for 7 days. 07/11/19 07/18/19  Margalit Leece, Marguerita Beards, PA-C  SUPREP BOWEL PREP KIT 17.5-3.13-1.6 GM/177ML SOLN See admin instructions. 08/12/17   [provider]  Vitamin D, Ergocalciferol, (DRISDOL) 50000 units CAPS capsule TAKE ONE CAPSULE BY MOUTH ONCE A WEEK FOR 12 WEEKS 08/09/17   [provider]    Family History Family History  Problem Relation Age of Onset  . Diabetes Mother   . Diabetes Father     Social History Social History   Tobacco Use  . Smoking status: Current Some Day Smoker    Packs/day: 0.20    Types: Cigarettes  . Smokeless tobacco: Never Used  Substance Use Topics  . Alcohol use: Not Currently    Comment: none now, once per week or less  . Drug use: No     Allergies   Patient has no known allergies.   Review of Systems Review of Systems  Constitutional: Negative for chills and fever.  Genitourinary: Negative for penile pain, penile swelling, scrotal swelling and testicular pain.  Skin: Negative for color change.       Abscess in left groin   All other systems reviewed and are negative.    Physical Exam Triage Vital Signs ED Triage Vitals  Enc Vitals Group     BP 07/11/19 1015 (!) 143/90     Pulse Rate 07/11/19 1015 (!) 105     Resp 07/11/19 1015 16     Temp 07/11/19 1015 98.6 F (37 C)     Temp Source 07/11/19 1015 Oral     SpO2 07/11/19 1015 97 %     Weight --      Height --      Head Circumference --      Peak Flow --      Pain Score 07/11/19 1013 4     Pain Loc --      Pain Edu? --      Excl. in [place]? --    No data found.  Updated Vital Signs BP (!) 143/90   Pulse (!) 105   Temp 98.6 F (37 C) (Oral)   Resp 16   SpO2 97%   Visual Acuity Right Eye Distance:   Left Eye Distance:   Bilateral  Distance:    Right Eye Near:   Left Eye Near:    Bilateral Near:     Physical Exam Vitals and nursing note reviewed.  Constitutional:      General: He is not in acute distress.    Appearance: Normal appearance. He is well-developed. He is not ill-appearing.  HENT:     Head: Normocephalic and atraumatic.  Eyes:     Conjunctiva/sclera: Conjunctivae normal.  Cardiovascular:  Rate and Rhythm: Normal rate.  Pulmonary:     Effort: Pulmonary effort is normal. No respiratory distress.  Genitourinary:    Penis: Normal.      Testes: Normal.     Comments: 1.5cm x 1.5cm area of induration with central fluctuance in left groin. No discharge and non-tender. Testicles non-tender and no scrotal swelling Musculoskeletal:        General: Normal range of motion.     Cervical back: Neck supple.  Skin:    General: Skin is warm and dry.  Neurological:     General: No focal deficit present.     Mental Status: He is alert and oriented to person, place, and time.  Psychiatric:        Mood and Affect: Mood normal.        Behavior: Behavior normal.        Thought Content: Thought content normal.        Judgment: Judgment normal.      UC Treatments / Results  Labs (all labs ordered are listed, but only abnormal results are displayed) Labs Reviewed - No data to display  EKG   Radiology No results found.  Procedures Incision and Drainage  Date/Time: 07/11/2019 11:22 AM Performed by: Purnell Shoemaker, PA-C Authorized by: Purnell Shoemaker, PA-C   Consent:    Consent obtained:  Verbal   Consent given by:  Patient   Risks discussed:  Bleeding, incomplete drainage, pain and infection   Alternatives discussed:  No treatment and alternative treatment Location:    Type:  Abscess   Location: Left Groin. Pre-procedure details:    Skin preparation:  Antiseptic wash and Betadine Anesthesia (see MAR for exact dosages):    Anesthesia method:  Topical application and local infiltration   Local  anesthetic:  Lidocaine 2% WITH epi Procedure type:    Complexity:  Simple Procedure details:    Needle aspiration: yes     Needle size:  18 G   Incision types:  Stab incision   Incision depth:  Subcutaneous   Scalpel blade:  11   Wound management:  Probed and deloculated   Drainage:  Bloody and purulent   Drainage amount:  Scant   Wound treatment:  Wound left open   Packing materials:  None Post-procedure details:    Patient tolerance of procedure:  Tolerated well, no immediate complications Comments:     approximately 0.57m purulent fluid removed with aspiration   (including critical care time)  Medications Ordered in UC Medications - No data to display  Initial Impression / Assessment and Plan / UC Course  I have reviewed the triage vital signs and the nursing notes.  Pertinent labs & imaging results that were available during my care of the patient were reviewed by me and considered in my medical decision making (see chart for details).     #Abscess Due to odd presentation with no pain on manipulation, aspiration was done prior to I&D. Aspiration revealed purulence, I&D was elected.  - bactrim x 7 days - return precautions and wound care discussed   Final Clinical Impressions(s) / UC Diagnoses   Final diagnoses:  Abscess of groin, left     Discharge Instructions     I have sent an antiobiotic call bactrim, take this 2 times a day for 7 days  Clean the area daily and replace bandaging with nonstick gauze and tape of your choice, the tape applied today is called medipore  Small amounts of discharge are expected  for the next few days. I would like for you to massage the area and try to dispell any more discharge possible of the next 1-2 days  If you notice continued swelling or non-healing over the next 1 week, please return for re-evaluation.  Tylenol and ibuprofen will work well for pain      ED Prescriptions    Medication Sig Dispense Auth. Provider    sulfamethoxazole-trimethoprim (BACTRIM DS) 800-160 MG tablet Take 1 tablet by mouth 2 (two) times daily for 7 days. 14 tablet Ambreen Tufte, Marguerita Beards, PA-C     PDMP not reviewed this encounter.   Purnell Shoemaker, PA-C 07/11/19 1125

## 2019-07-24 ENCOUNTER — Other Ambulatory Visit: Payer: Self-pay

## 2019-07-24 ENCOUNTER — Ambulatory Visit (HOSPITAL_COMMUNITY)
Admission: EM | Admit: 2019-07-24 | Discharge: 2019-07-24 | Disposition: A | Discharge status: Home / Self Care | Payer: Medicaid Other | Attending: Urgent Care | Admitting: Urgent Care

## 2019-07-24 ENCOUNTER — Encounter (HOSPITAL_COMMUNITY): Payer: Self-pay

## 2019-07-24 DIAGNOSIS — U071 COVID-19: Secondary | ICD-10-CM | POA: Diagnosis not present

## 2019-07-24 DIAGNOSIS — J069 Acute upper respiratory infection, unspecified: Secondary | ICD-10-CM | POA: Diagnosis present

## 2019-07-24 DIAGNOSIS — Z20822 Contact with and (suspected) exposure to covid-19: Secondary | ICD-10-CM | POA: Diagnosis present

## 2019-07-24 MED ORDER — HYDROCODONE-HOMATROPINE 5-1.5 MG/5ML PO SYRP: 5.0000 mL | ORAL_SOLUTION | Freq: Four times a day (QID) | ORAL | 0 refills | Status: DC | PRN

## 2019-07-24 MED ORDER — BENZONATATE 100 MG PO CAPS: 100.0000 mg | ORAL_CAPSULE | Freq: Three times a day (TID) | ORAL | 0 refills | Status: DC

## 2019-07-24 NOTE — ED Triage Notes (Signed)
Pt present non productive cough that started two days ago. Pt denies any other symptoms but would like to be tested for covid 19

## 2019-07-24 NOTE — Discharge Instructions (Addendum)
Take the Hycodan primarily at night as this will make you sleepy.  However if you have no obligations and do not have to drive you may take this during the day if your cough is severe.  Utilize the Tessalon every 8 hours during the day for cough as well.  I want you to continue your Flonase and include nasal saline spray.  Take tylenol 325mg  tablet x 2 up to every 6 hours for sore throat, fever and body aches. Do not exceed 8 tablets in 24 hours  Go directly to the Emergency Department or call 911 if you have severe chest pain, shortness of breath, severe diarrhea or feel as though you might pass out.    If your Covid-19 test is positive, you will receive a phone call from [facility] regarding your results. Negative test results are not called. Both positive and negative results area always visible on MyChart. If you do not have a MyChart account, sign up instructions are in your discharge papers.   Persons who are directed to care for themselves at home may discontinue isolation under the following conditions:   At least 10 days have passed since symptom onset and  At least 24 hours have passed without running a fever (this means without the use of fever-reducing medications) and  Other symptoms have improved.  Persons infected with COVID-19 who never develop symptoms may discontinue isolation and other precautions 10 days after the date of their first positive COVID-19 test.

## 2019-07-24 NOTE — ED Provider Notes (Signed)
[place]    CSN: [id number] Arrival date & time: 07/24/19  1003      History   Chief Complaint Chief Complaint  Patient presents with  . Cough    HPI Christopher Abbott is a 44 y.o. male.   Patient presents urgent care today for 2-day history of dry cough.  He reports the cough has been worse at night.  He feels like there is something dripping in his throat at times which causes his cough to be worse.  He denies shortness of breath or cough productivity.  He denies nasal congestion or runny nose.  Denies sore throat.  He denies fever, chills, body ache, nausea, vomiting, abdominal pain, change or loss of smell and taste.  He has taken Robitussin for his cough with some mild improvement last night.  He reports both his sisters were over to his house early last week he believes and they have both since tested positive for Covid.  No one else in his house currently is sick.  He does have a history of diabetes and we discussed that if his Covid test is positive he may receive a call for possible treatments from [facility] health.  We also discussed the progress of his left groin abscess that was drained on 07/11/2019.  He reports no pain and no increase in size.  He reports the area is soft.     Past Medical History:  Diagnosis Date  . Diabetes mellitus    type 2  . GERD (gastroesophageal reflux disease)   . Hypertension     Patient Active Problem List   Diagnosis Date Noted  . Internal hemorrhoid, bleeding 08/29/2017  . Type 2 diabetes mellitus ([place]) 08/06/2017  . 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 Rupture of left Achilles tendon 07/18/2015    Past Surgical History:  Procedure Laterality Date  . ACHILLES TENDON SURGERY Left 07/18/2015   Procedure: LEFT ACHILLES TENDON DIRECT PRIMARY REPAIR;  Surgeon: Mcarthur Rossetti, MD;  Location: [facility];  Service: Orthopedics;  Laterality: Left;  . COLONOSCOPY WITH PROPOFOL N/A 08/14/2017   Procedure: COLONOSCOPY WITH  PROPOFOL;  Surgeon: Lin Landsman, MD;  Location: [facility] ENDOSCOPY;  Service: Gastroenterology;  Laterality: N/A;  . NO PAST SURGERIES         Home Medications    Prior to Admission medications   Medication Sig Start Date End Date Taking? Authorizing Provider  ACCU-CHEK FASTCLIX LANCETS MISC USE AS DIRECTED TO CHECK BLOOD SUAGR DX: E11.9 08/28/17   [provider]  ACCU-CHEK GUIDE test strip USE AS DIRECTED TO TEST BLOOD SUGAR TWICE A DAY DX:E11.9 08/29/17   [provider]  B-D ULTRAFINE III SHORT PEN 31G X 8 MM MISC USE WITH INSULIN PEN INJECTIONS DAILY ( DX: E11.9) 09/17/17   [provider]  benzonatate (TESSALON) 100 MG capsule Take 1 capsule (100 mg total) by mouth every 8 (eight) hours. 07/24/19   Sarah Zerby, Marguerita Beards, PA-C  Blood Glucose Monitoring Suppl (ACCU-CHEK GUIDE) w/Device KIT USE AS DIRECTED DX E11.9 (MEDICAID PREFERRED) 08/28/17   [provider]  chlorhexidine (PERIDEX) 0.12 % solution RINSE WITH 1/2 CAPFUL AND SWISH FOR 30 SECONDS, THEN SPIT. USE TWICE A DAY 09/10/17   [provider]  chlorpheniramine-HYDROcodone (TUSSIONEX PENNKINETIC ER) 10-8 MG/5ML SUER Take 5 mLs by mouth every 12 (twelve) hours as needed for cough. 05/06/18   Melynda Ripple, MD  esomeprazole (NEXIUM) 20 MG capsule Take 20 mg by mouth daily at 12 noon.  [provider]  fluticasone (FLONASE) 50 MCG/ACT nasal spray Place 2 sprays into both nostrils daily. 05/06/18   Melynda Ripple, MD  HYDROcodone-homatropine [facility]) 5-1.5 MG/5ML syrup Take 5 mLs by mouth every 6 (six) hours as needed for cough. 07/24/19   Shaheed Schmuck, Marguerita Beards, PA-C  hydrocortisone (ANUSOL-HC) 2.5 % rectal cream Apply rectally 2 times daily 02/07/17   Robyn Haber, MD  ibuprofen (ADVIL,MOTRIN) 600 MG tablet Take 1 tablet (600 mg total) by mouth every 6 (six) hours as needed. 09/06/17   Moss, Amber, DO  insulin glargine (LANTUS) 100 UNIT/ML injection Inject 70 Units into the skin daily.      [provider]  JANUMET XR [phone] MG TB24 Take 1 tablet by mouth daily. 07/10/15   [provider]  LANTUS SOLOSTAR 100 UNIT/ML Solostar Pen INJECT 45 UNITS INTO THE SKIN 4 TIMES A DAY 08/09/17   [provider]  Liraglutide (VICTOZA) 18 MG/3ML SOPN Inject 1.8 mg into the skin daily.    [provider]  lisinopril-hydrochlorothiazide (PRINZIDE,ZESTORETIC) 20-12.5 MG tablet Take 1 tablet by mouth daily. for high blood pressure 07/02/15   [provider]  metFORMIN (GLUCOPHAGE) 1000 MG tablet Take 1,000 mg by mouth 2 (two) times daily with a meal.    [provider]  pravastatin (PRAVACHOL) 40 MG tablet Take 40 mg by mouth every evening. for cholesterol 05/30/15   [provider]  SUPREP BOWEL PREP KIT 17.5-3.13-1.6 GM/177ML SOLN See admin instructions. 08/12/17   [provider]  Vitamin D, Ergocalciferol, (DRISDOL) 50000 units CAPS capsule TAKE ONE CAPSULE BY MOUTH ONCE A WEEK FOR 12 WEEKS 08/09/17   [provider]    Family History Family History  Problem Relation Age of Onset  . Diabetes Mother   . Diabetes Father     Social History Social History   Tobacco Use  . Smoking status: Current Some Day Smoker    Packs/day: 0.20    Types: Cigarettes  . Smokeless tobacco: Never Used  Substance Use Topics  . Alcohol use: Not Currently    Comment: none now, once per week or less  . Drug use: No     Allergies   Patient has no known allergies.   Review of Systems Review of Systems  Constitutional: Negative for chills, fatigue and fever.  HENT: Positive for postnasal drip. Negative for congestion, ear pain, rhinorrhea, sinus pressure, sinus pain and sore throat.   Eyes: Negative for pain and visual disturbance.  Respiratory: Positive for cough. Negative for chest tightness and shortness of breath.   Cardiovascular: Negative for chest pain and palpitations.  Gastrointestinal: Negative for abdominal pain,  diarrhea, nausea and vomiting.  Genitourinary: Negative for dysuria and hematuria.  Musculoskeletal: Negative for arthralgias, back pain and myalgias.  Skin: Negative for color change and rash.  Neurological: Negative for seizures, syncope and headaches.  All other systems reviewed and are negative.    Physical Exam Triage Vital Signs ED Triage Vitals  Enc Vitals Group     BP      Pulse      Resp      Temp      Temp src      SpO2      Weight      Height      Head Circumference      Peak Flow      Pain Score      Pain Loc      Pain Edu?  Excl. in [place]?    No data found.  Updated Vital Signs BP 138/78 (BP Location: Right Arm)   Pulse (!) 101   Temp 99.1 F (37.3 C) (Oral)   Resp 20   SpO2 100%   Visual Acuity Right Eye Distance:   Left Eye Distance:   Bilateral Distance:    Right Eye Near:   Left Eye Near:    Bilateral Near:     Physical Exam Vitals and nursing note reviewed.  Constitutional:      General: He is not in acute distress.    Appearance: Normal appearance. He is well-developed. He is not ill-appearing.  HENT:     Head: Normocephalic and atraumatic.     Nose: Nose normal. No congestion or rhinorrhea.     Comments: Turbinates swollen with erythema some mild discharge appreciated.    Mouth/Throat:     Mouth: Mucous membranes are moist.     Pharynx: No oropharyngeal exudate or posterior oropharyngeal erythema.     Comments: There is evidence of some postnasal drip Eyes:     General: No scleral icterus.    Extraocular Movements: Extraocular movements intact.     Conjunctiva/sclera: Conjunctivae normal.     Pupils: Pupils are equal, round, and reactive to light.  Cardiovascular:     Rate and Rhythm: Normal rate and regular rhythm.     Heart sounds: No murmur.     Comments: Rate had reduced from initial vital signs Pulmonary:     Effort: Pulmonary effort is normal. No respiratory distress.     Breath sounds: Normal breath sounds. No  wheezing, rhonchi or rales.  Abdominal:     Palpations: Abdomen is soft.     Tenderness: There is no abdominal tenderness.  Genitourinary:    Comments: Previous incision and drainage site over left aspect of groin with well-healed scar.  Area is soft and nontender. Musculoskeletal:     Cervical back: Normal range of motion and neck supple.     Right lower leg: No edema.     Left lower leg: No edema.  Skin:    General: Skin is warm and dry.     Capillary Refill: Capillary refill takes less than 2 seconds.  Neurological:     General: No focal deficit present.     Mental Status: He is alert and oriented to person, place, and time.  Psychiatric:        Mood and Affect: Mood normal.        Behavior: Behavior normal.        Thought Content: Thought content normal.        Judgment: Judgment normal.      UC Treatments / Results  Labs (all labs ordered are listed, but only abnormal results are displayed) Labs Reviewed  NOVEL CORONAVIRUS, NAA (HOSP ORDER, SEND-OUT TO REF LAB; TAT 18-24 HRS)    EKG   Radiology No results found.  Procedures Procedures (including critical care time)  Medications Ordered in UC Medications - No data to display  Initial Impression / Assessment and Plan / UC Course  I have reviewed the triage vital signs and the nursing notes.  Pertinent labs & imaging results that were available during my care of the patient were reviewed by me and considered in my medical decision making (see chart for details).     #Viral URI with cough Patient is a 44 year old male patient with past medical history of diabetes presenting with 2-day history of cough.  Though he  does not complain of other URI symptoms he does have evidence of postnasal drip and nasal congestion.  Given he had recent positive Covid contacts there is a higher suspicion that this may be Covid.  He is doing well today saturating on her percent and not ill-appearing.  Covid PCR was sent.   -Hycodan  was sent for cough primarily to be used at night.  Tessalon during the day -Discussed if positive result that he may receive a call for infusion center treatment due to his diabetes. -Emergency department  precautions were discussed and patient understands.  With regard to his previous abscess in his left groin and this appears healing well and we discussed that if he were to notice increased growth or pain in the area that he should return for evaluation.  But I reassured him that I believe that this is healing well.  Final Clinical Impressions(s) / UC Diagnoses   Final diagnoses:  Viral URI with cough  Encounter for laboratory testing for COVID-19 virus     Discharge Instructions     Take the Hycodan primarily at night as this will make you sleepy.  However if you have no obligations and do not have to drive you may take this during the day if your cough is severe.  Utilize the Tessalon every 8 hours during the day for cough as well.  I want you to continue your Flonase and include nasal saline spray.  Take tylenol 318m tablet x 2 up to every 6 hours for sore throat, fever and body aches. Do not exceed 8 tablets in 24 hours  Go directly to the Emergency Department or call 911 if you have severe chest pain, shortness of breath, severe diarrhea or feel as though you might pass out.    If your Covid-19 test is positive, [facility]. Negative test results are not called. Both positive and negative results area always visible on MyChart. If you do not have a MyChart account, sign up instructions are in your discharge papers.   Persons who are directed to care for themselves at home may discontinue isolation under the following conditions:  . At least 10 days have passed since symptom onset and . At least 24 hours have passed without running a fever (this means without the use of fever-reducing medications) and . Other  symptoms have improved.  Persons infected with COVID-19 who never develop symptoms may discontinue isolation and other precautions 10 days after the date of their first positive COVID-19 test.      ED Prescriptions    Medication Sig Dispense Auth. Provider   HYDROcodone-homatropine (HYCODAN) 5-1.5 MG/5ML syrup Take 5 mLs by mouth every 6 (six) hours as needed for cough. 120 mL Wendle Kina, JMarguerita Beards PA-C   benzonatate (TESSALON) 100 MG capsule Take 1 capsule (100 mg total) by mouth every 8 (eight) hours. 21 capsule Shalece Staffa, JMarguerita Beards PA-C     PDMP not reviewed this encounter.   DPurnell Shoemaker PA-C 07/24/19 1102

## 2019-07-27 LAB — NOVEL CORONAVIRUS, NAA (HOSP ORDER, SEND-OUT TO REF LAB; TAT 18-24 HRS): SARS-CoV-2, NAA: DETECTED — AB

## 2019-07-28 ENCOUNTER — Telehealth: Payer: Self-pay | Admitting: Unknown Physician Specialty

## 2019-07-28 NOTE — Telephone Encounter (Signed)
Called to discuss with patient about Covid symptoms and the use of bamlanivimab, a monoclonal antibody infusion for those with mild to moderate Covid symptoms and at a high risk of hospitalization.  Pt is qualified for this infusion at the [facility] infusion center due to Diabetes   Message left to call back  

## 2019-07-28 NOTE — Telephone Encounter (Signed)
Called to discuss with Christopher Abbott about Covid symptoms and the use of bamlanivimab, a monoclonal antibody infusion for those with mild to moderate Covid symptoms and at a high risk of hospitalization.     Pt does not qualify for infusion therapy as his symptoms first presented > 10 days and asymptomatic prior to timing of infusion. Symptoms tier reviewed as well as criteria for ending isolation. Preventative practices reviewed. Patient verbalized understanding   Patient Active Problem List   Diagnosis Date Noted  . Internal hemorrhoid, bleeding 08/29/2017  . Type 2 diabetes mellitus ([facility]) 08/06/2017  . 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 Rupture of left Achilles tendon 07/18/2015   Gabriel Cirri

## 2019-07-29 ENCOUNTER — Telehealth (HOSPITAL_COMMUNITY): Payer: Self-pay | Admitting: Emergency Medicine

## 2019-07-29 NOTE — Telephone Encounter (Signed)

## 2019-08-01 ENCOUNTER — Other Ambulatory Visit: Payer: Self-pay

## 2019-08-01 ENCOUNTER — Ambulatory Visit (HOSPITAL_COMMUNITY)
Admission: EM | Admit: 2019-08-01 | Discharge: 2019-08-01 | Disposition: A | Discharge status: Home / Self Care | Payer: Medicaid Other | Attending: Family Medicine | Admitting: Family Medicine

## 2019-08-01 ENCOUNTER — Ambulatory Visit (INDEPENDENT_AMBULATORY_CARE_PROVIDER_SITE_OTHER): Payer: Medicaid Other

## 2019-08-01 ENCOUNTER — Encounter (HOSPITAL_COMMUNITY): Payer: Self-pay

## 2019-08-01 DIAGNOSIS — J209 Acute bronchitis, unspecified: Secondary | ICD-10-CM

## 2019-08-01 DIAGNOSIS — U071 COVID-19: Secondary | ICD-10-CM

## 2019-08-01 DIAGNOSIS — R0602 Shortness of breath: Secondary | ICD-10-CM

## 2019-08-01 IMAGING — DX DG CHEST 2V
2 series · 2 of 2 positions shown · non-contrast
Comparison: [DATE] and earlier.

CLINICAL DATA: Two-day history of shortness of breath. [N6]
positive. Current smoker.

EXAM:
CHEST - 2 VIEW

[chest pa]
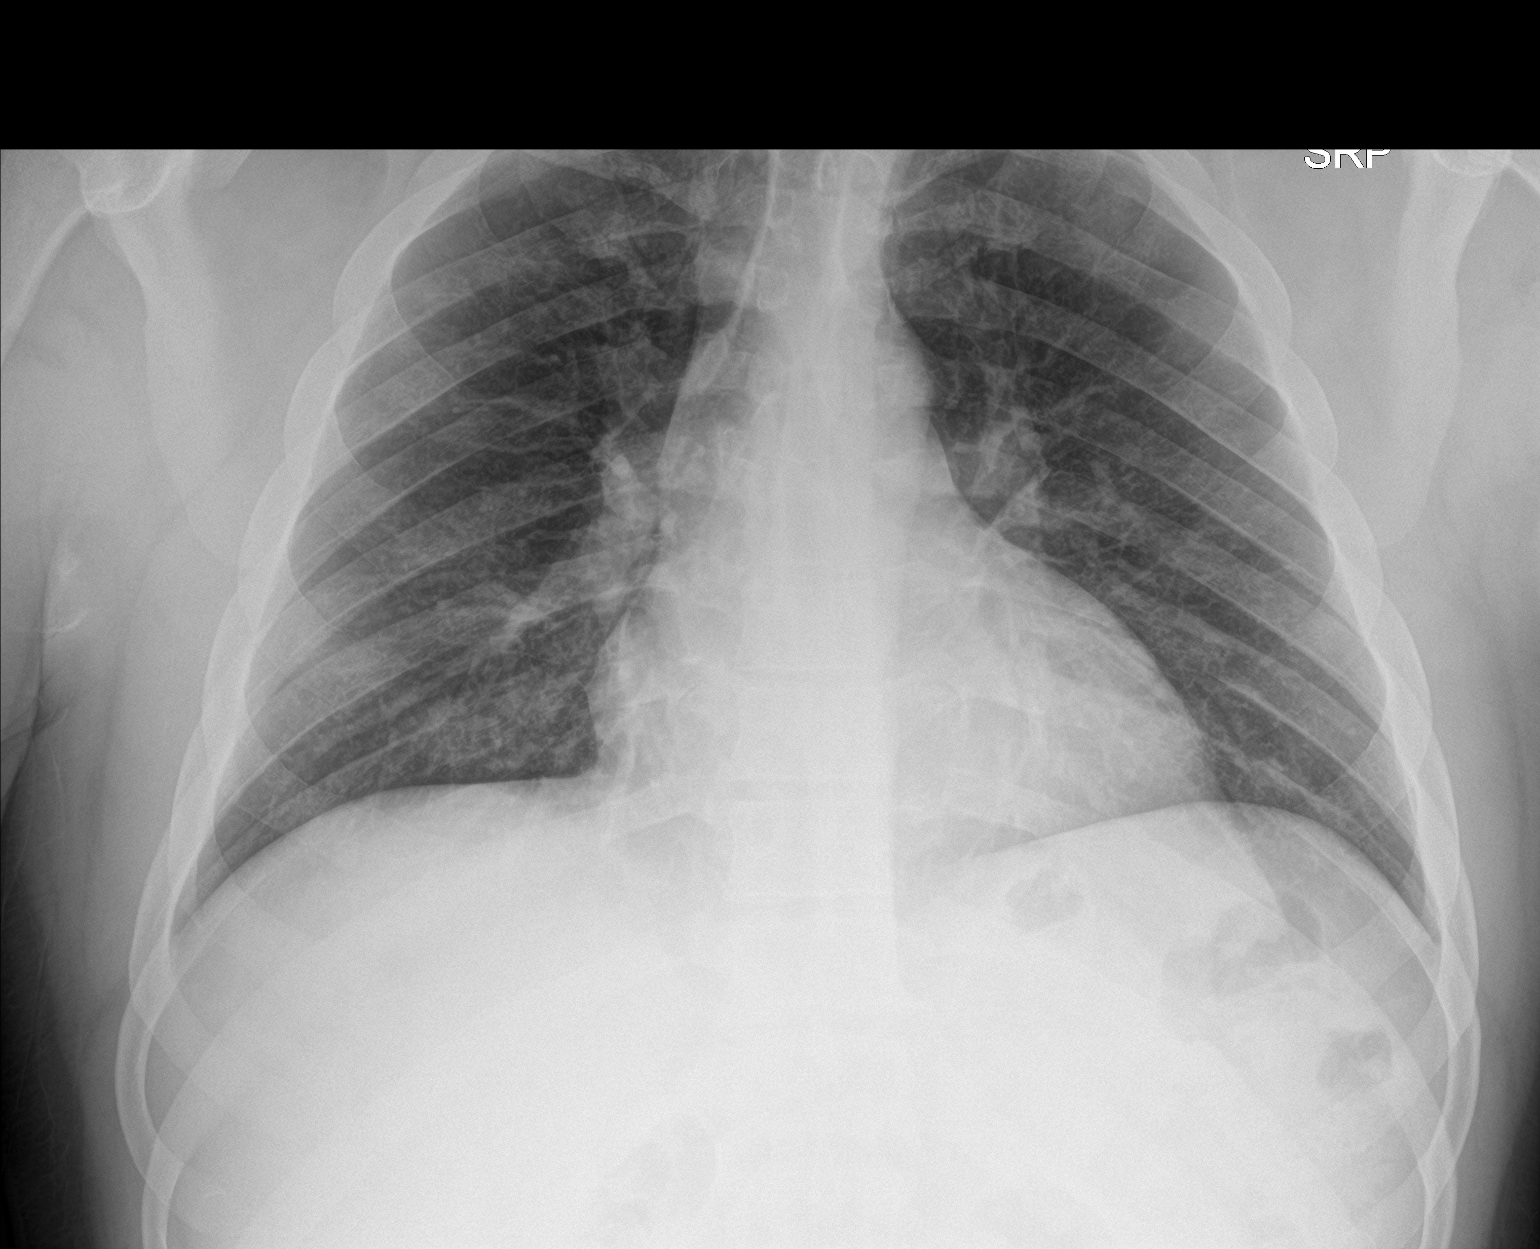

[chest lat]
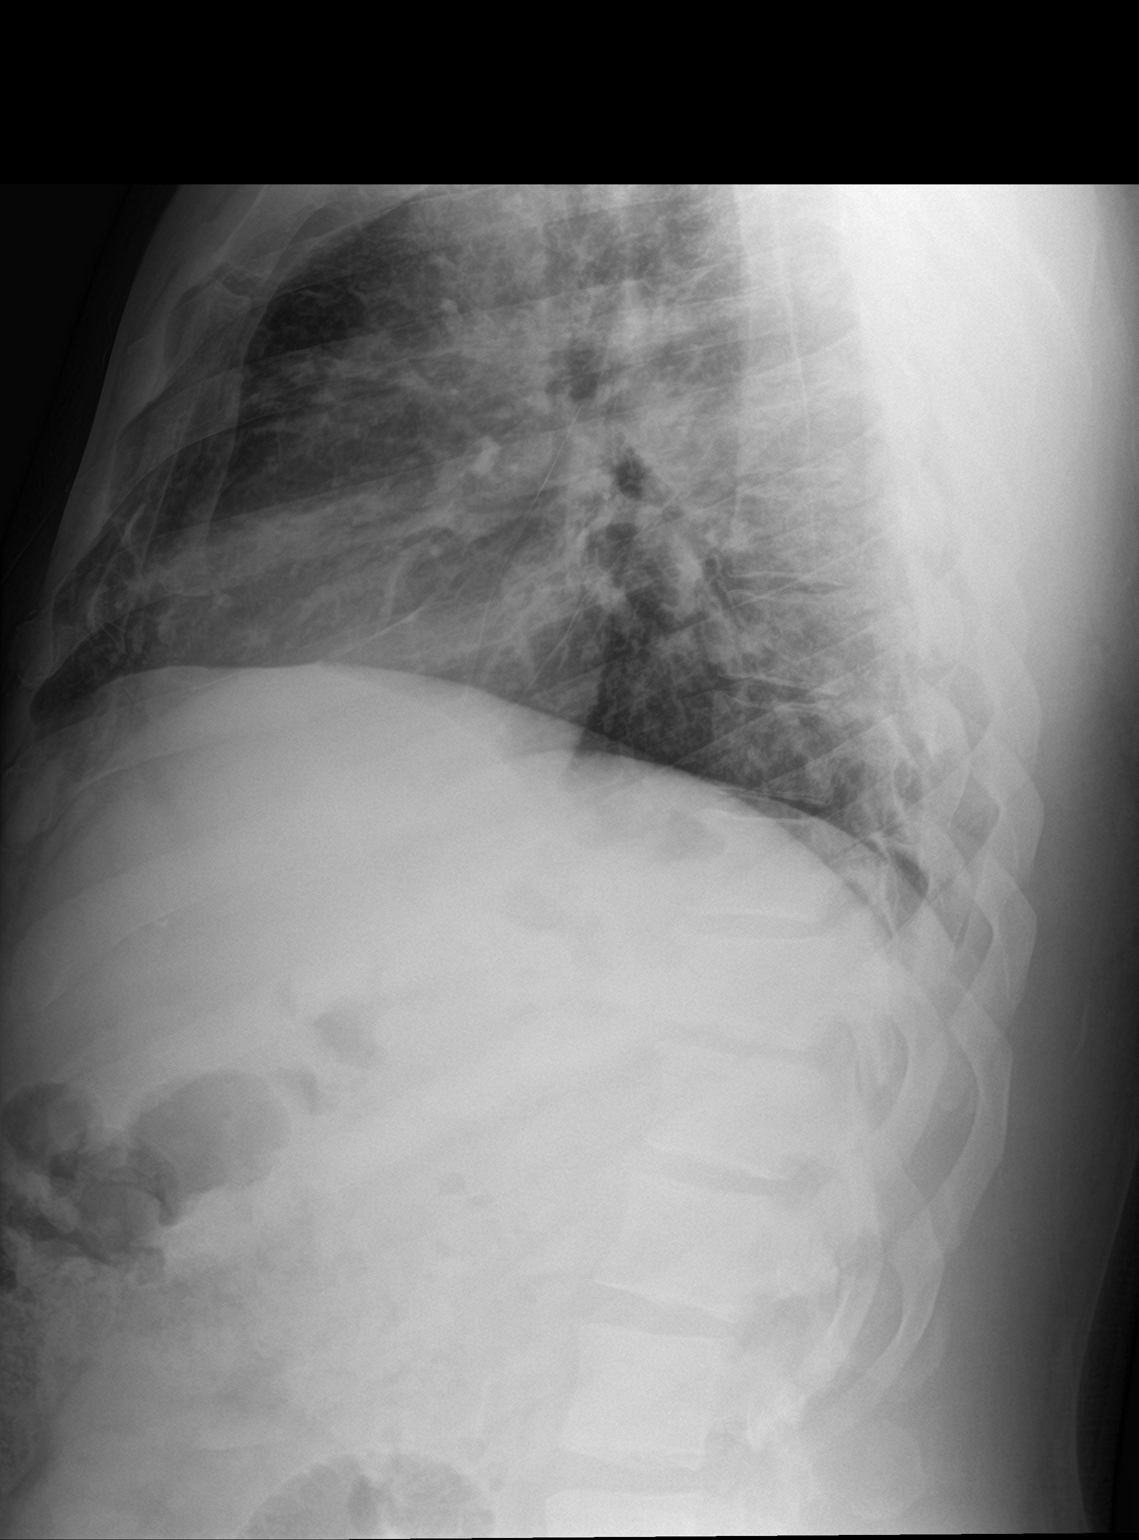

[2 of 2 positions shown; findings below may reference images not displayed]

FINDINGS: Cardiomediastinal silhouette unremarkable and unchanged. Mild
central peribronchial thickening, unchanged. Lungs otherwise clear.
No localized airspace consolidation. No pleural effusions. No
pneumothorax. Normal pulmonary vascularity. Visualized bony thorax
intact.
IMPRESSION: Stable mild changes of bronchitis and/or asthma. No acute
cardiopulmonary disease.

## 2019-08-01 MED ORDER — AZITHROMYCIN 250 MG PO TABS: ORAL_TABLET | ORAL | 0 refills | Status: DC

## 2019-08-01 MED ORDER — PREDNISONE 50 MG PO TABS: 50.0000 mg | ORAL_TABLET | Freq: Every day | ORAL | 0 refills | Status: AC

## 2019-08-01 MED ORDER — PROMETHAZINE-DM 6.25-15 MG/5ML PO SYRP: 5.0000 mL | ORAL_SOLUTION | Freq: Four times a day (QID) | ORAL | 0 refills | Status: DC | PRN

## 2019-08-01 MED ORDER — ALBUTEROL SULFATE HFA 108 (90 BASE) MCG/ACT IN AERS
2.0000 | INHALATION_SPRAY | Freq: Once | RESPIRATORY_TRACT | Status: AC
  Administered 2019-08-01: 2 via RESPIRATORY_TRACT

## 2019-08-01 MED ORDER — ALBUTEROL SULFATE HFA 108 (90 BASE) MCG/ACT IN AERS
INHALATION_SPRAY | RESPIRATORY_TRACT | Status: AC
  Filled 2019-08-01: qty 6.7

## 2019-08-01 NOTE — ED Provider Notes (Signed)
[place]    CSN: [id number] Arrival date & time: 08/01/19  1112      History   Chief Complaint Chief Complaint  Patient presents with  . Shortness of Breath    HPI Christopher Abbott is a 44 y.o. male.  HPI Patient presents today for evaluation of shortness of breath.  Patient is COVID-19 positive and was diagnosed on 07/24/2019.  Endorses shortness of breath which is worse with talking.  He is high risk for complications associated to COVID-19 due to type 2 diabetes, hypertension, and obesity.  He has no history of asthma or any chronic respiratory disease.  He is a current daily smoker with also increases his risk for complications related to COVID-19.  His most recent A1c was 7.7 per patient.  He has been checking his blood sugar at home and reports readings have been within his normal range.  Denies any dizziness or chest pain.  He is tolerating food and fluids normally.  He endorses some altered taste although has not lost sense of taste or smell. Past Medical History:  Diagnosis Date  . Diabetes mellitus    type 2  . GERD (gastroesophageal reflux disease)   . Hypertension     Patient Active Problem List   Diagnosis Date Noted  . Internal hemorrhoid, bleeding 08/29/2017  . Type 2 diabetes mellitus ([place]) 08/06/2017  . 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 Rupture of left Achilles tendon 07/18/2015    Past Surgical History:  Procedure Laterality Date  . ACHILLES TENDON SURGERY Left 07/18/2015   Procedure: LEFT ACHILLES TENDON DIRECT PRIMARY REPAIR;  Surgeon: Mcarthur Rossetti, MD;  Location: [place];  Service: Orthopedics;  Laterality: Left;  . COLONOSCOPY WITH PROPOFOL N/A 08/14/2017   Procedure: COLONOSCOPY WITH PROPOFOL;  Surgeon: Lin Landsman, MD;  Location: [facility] ENDOSCOPY;  Service: Gastroenterology;  Laterality: N/A;  . NO PAST SURGERIES         Home Medications    Prior to Admission medications   Medication Sig Start Date End Date Taking?  Authorizing Provider  ACCU-CHEK FASTCLIX LANCETS MISC USE AS DIRECTED TO CHECK BLOOD SUAGR DX: E11.9 08/28/17   [provider]  ACCU-CHEK GUIDE test strip USE AS DIRECTED TO TEST BLOOD SUGAR TWICE A DAY DX:E11.9 08/29/17   [provider]  B-D ULTRAFINE III SHORT PEN 31G X 8 MM MISC USE WITH INSULIN PEN INJECTIONS DAILY ( DX: E11.9) 09/17/17   [provider]  benzonatate (TESSALON) 100 MG capsule Take 1 capsule (100 mg total) by mouth every 8 (eight) hours. 07/24/19   Darr, Marguerita Beards, PA-C  Blood Glucose Monitoring Suppl (ACCU-CHEK GUIDE) w/Device KIT USE AS DIRECTED DX E11.9 (MEDICAID PREFERRED) 08/28/17   [provider]  chlorhexidine (PERIDEX) 0.12 % solution RINSE WITH 1/2 CAPFUL AND SWISH FOR 30 SECONDS, THEN SPIT. USE TWICE A DAY 09/10/17   [provider]  chlorpheniramine-HYDROcodone (TUSSIONEX PENNKINETIC ER) 10-8 MG/5ML SUER Take 5 mLs by mouth every 12 (twelve) hours as needed for cough. 05/06/18   Melynda Ripple, MD  esomeprazole (NEXIUM) 20 MG capsule Take 20 mg by mouth daily at 12 noon.    [provider]  fluticasone (FLONASE) 50 MCG/ACT nasal spray Place 2 sprays into both nostrils daily. 05/06/18   Melynda Ripple, MD  HYDROcodone-homatropine [facility]) 5-1.5 MG/5ML syrup Take 5 mLs by mouth every 6 (six) hours as needed for cough. 07/24/19   Darr, Marguerita Beards, PA-C  hydrocortisone (ANUSOL-HC) 2.5 % rectal cream Apply rectally 2  times daily 02/07/17   Robyn Haber, MD  ibuprofen (ADVIL,MOTRIN) 600 MG tablet Take 1 tablet (600 mg total) by mouth every 6 (six) hours as needed. 09/06/17   Moss, Amber, DO  insulin glargine (LANTUS) 100 UNIT/ML injection Inject 70 Units into the skin daily.     [provider]  JANUMET XR [phone] MG TB24 Take 1 tablet by mouth daily. 07/10/15   [provider]  LANTUS SOLOSTAR 100 UNIT/ML Solostar Pen INJECT 45 UNITS INTO THE SKIN 4 TIMES A DAY 08/09/17   [provider]    Liraglutide (VICTOZA) 18 MG/3ML SOPN Inject 1.8 mg into the skin daily.    [provider]  lisinopril-hydrochlorothiazide (PRINZIDE,ZESTORETIC) 20-12.5 MG tablet Take 1 tablet by mouth daily. for high blood pressure 07/02/15   [provider]  metFORMIN (GLUCOPHAGE) 1000 MG tablet Take 1,000 mg by mouth 2 (two) times daily with a meal.    [provider]  pravastatin (PRAVACHOL) 40 MG tablet Take 40 mg by mouth every evening. for cholesterol 05/30/15   [provider]  SUPREP BOWEL PREP KIT 17.5-3.13-1.6 GM/177ML SOLN See admin instructions. 08/12/17   [provider]  Vitamin D, Ergocalciferol, (DRISDOL) 50000 units CAPS capsule TAKE ONE CAPSULE BY MOUTH ONCE A WEEK FOR 12 WEEKS 08/09/17   [provider]    Family History Family History  Problem Relation Age of Onset  . Diabetes Mother   . Diabetes Father     Social History Social History   Tobacco Use  . Smoking status: Current Some Day Smoker    Packs/day: 0.20    Types: Cigarettes  . Smokeless tobacco: Never Used  Substance Use Topics  . Alcohol use: Not Currently    Comment: none now, once per week or less  . Drug use: No     Allergies   Patient has no known allergies.   Review of Systems Review of Systems Pertinent negatives listed in HPI Physical Exam Triage Vital Signs ED Triage Vitals  Enc Vitals Group     BP 08/01/19 1126 114/68     Pulse Rate 08/01/19 1126 (!) 106     Resp 08/01/19 1126 18     Temp 08/01/19 1126 98.6 F (37 C)     Temp Source 08/01/19 1126 Oral     SpO2 08/01/19 1126 100 %     Weight --      Height --      Head Circumference --      Peak Flow --      Pain Score 08/01/19 1127 0     Pain Loc --      Pain Edu? --      Excl. in [place]? --    No data found.  Updated Vital Signs BP 114/68 (BP Location: Right Arm)   Pulse (!) 106   Temp 98.6 F (37 C) (Oral)   Resp 18   SpO2 100%   Visual Acuity Right Eye Distance:   Left Eye  Distance:   Bilateral Distance:    Right Eye Near:   Left Eye Near:    Bilateral Near:     Physical Exam Constitutional:      Appearance: He is obese.  Cardiovascular:     Rate and Rhythm: Tachycardia present.     Heart sounds: Normal heart sounds.  Pulmonary:     Effort: Pulmonary effort is normal.     Breath sounds: Normal air entry. Examination of the right-upper field reveals rhonchi. Examination  of the left-upper field reveals rhonchi. Rhonchi present. No decreased breath sounds or wheezing.  Skin:    General: Skin is warm.     Capillary Refill: Capillary refill takes less than 2 seconds.  Neurological:     Mental Status: He is alert.  Psychiatric:        Attention and Perception: Attention normal.        Mood and Affect: Mood normal.     UC Treatments / Results  Labs (all labs ordered are listed, but only abnormal results are displayed) Labs Reviewed - No data to display  EKG   Radiology No results found.  Procedures Procedures (including critical care time)  Medications Ordered in UC Medications - No data to display  Initial Impression / Assessment and Plan / UC Course  I have reviewed the triage vital signs and the nursing notes.  Pertinent labs & imaging results that were available during my care of the patient were reviewed by me and considered in my medical decision making (see chart for details).    Positive for COVID-19 treating today for acute bronchitis likely secondary to COVID-19 infection also exacerbated by history of current smoking.  Cover with azithromycin and add 3 days of prednisone 50 mg to reduce inflammation in the lungs which is worsening shortness of breath.  Chest x-ray reassuring as was negative for pneumonia.  Promethazine DM prescribed as needed for cough.  Albuterol inhaler 2 puffs every 4-6 hours as needed for shortness of breath.  Advised patient to continue vitamin D, on vitamin C, and zinc.  Red flags discussed.  Patient  verbalized understanding and agreement and will follow up if symptoms worsen or do not improve. Final Clinical Impressions(s) / UC Diagnoses   Final diagnoses:  [id number] virus infection  Acute bronchitis, unspecified organism  Shortness of breath     Discharge Instructions     Chest x-ray is negative for any pneumonia.  I have prescribed azithromycin to cover for bronchitis which was shown on the x-ray.  I am also prescribing you 3 days of prednisone 50 mg take 1 dose each morning with breakfast.  Avoid taking after 2 PM as this medication can cause insomnia.  Have also prescribed you Promethazine DM to take as needed for cough.  Continue to monitor your blood sugar if ranges are greater than 200 persistently please follow-up with your primary care provider.  For your albuterol inhaler I would like for you use 2 puffs every 4-6 hours as needed for shortness of breath.  If symptoms worsen or do not improve please follow-up with your primary care provider. Continue: Vitamin D 5,000 IU daily, Vitamin C 500 mg twice daily, Zinc 50 mg daily     ED Prescriptions    Medication Sig Dispense Auth. Provider   azithromycin (ZITHROMAX) 250 MG tablet Take 2 tabs PO x 1 dose, then 1 tab PO QD x 4 days 6 tablet Scot Jun, FNP   predniSONE (DELTASONE) 50 MG tablet Take 1 tablet (50 mg total) by mouth daily with breakfast for 3 days. 3 tablet Scot Jun, FNP   promethazine-dextromethorphan (PROMETHAZINE-DM) 6.25-15 MG/5ML syrup Take 5 mLs by mouth every 6 (six) hours as needed for cough. 140 mL Scot Jun, FNP     PDMP not reviewed this encounter.   Scot Jun, FNP 08/01/19 [phone]

## 2019-08-01 NOTE — ED Triage Notes (Signed)
Pt recently tested positive for covid 19,He is having difficulty breathing. Pt states the more he talks its difficulty to breath.

## 2019-08-01 NOTE — Discharge Instructions (Addendum)
Chest x-ray is negative for any pneumonia.  I have prescribed azithromycin to cover for bronchitis which was shown on the x-ray.  I am also prescribing you 3 days of prednisone 50 mg take 1 dose each morning with breakfast.  Avoid taking after 2 PM as this medication can cause insomnia.  Have also prescribed you Promethazine DM to take as needed for cough.  Continue to monitor your blood sugar if ranges are greater than 200 persistently please follow-up with your primary care provider.  For your albuterol inhaler I would like for you use 2 puffs every 4-6 hours as needed for shortness of breath.  If symptoms worsen or do not improve please follow-up with your primary care provider. Continue: Vitamin D 5,000 IU daily, Vitamin C 500 mg twice daily, Zinc 50 mg daily

## 2019-08-06 ENCOUNTER — Encounter (HOSPITAL_COMMUNITY): Payer: Self-pay | Admitting: Emergency Medicine

## 2019-08-06 ENCOUNTER — Other Ambulatory Visit: Payer: Self-pay

## 2019-08-06 ENCOUNTER — Emergency Department (HOSPITAL_COMMUNITY)
Admission: EM | Admit: 2019-08-06 | Discharge: 2019-08-06 | Disposition: A | Discharge status: Home / Self Care | Payer: Medicaid Other | Attending: Emergency Medicine | Admitting: Emergency Medicine

## 2019-08-06 ENCOUNTER — Emergency Department (HOSPITAL_COMMUNITY): Payer: Medicaid Other

## 2019-08-06 DIAGNOSIS — E119 Type 2 diabetes mellitus without complications: Secondary | ICD-10-CM | POA: Insufficient documentation

## 2019-08-06 DIAGNOSIS — R519 Headache, unspecified: Secondary | ICD-10-CM | POA: Diagnosis not present

## 2019-08-06 DIAGNOSIS — R05 Cough: Secondary | ICD-10-CM | POA: Diagnosis not present

## 2019-08-06 DIAGNOSIS — R0602 Shortness of breath: Secondary | ICD-10-CM | POA: Diagnosis present

## 2019-08-06 DIAGNOSIS — Z8616 Personal history of COVID-19: Secondary | ICD-10-CM | POA: Insufficient documentation

## 2019-08-06 DIAGNOSIS — I10 Essential (primary) hypertension: Secondary | ICD-10-CM | POA: Insufficient documentation

## 2019-08-06 DIAGNOSIS — Z794 Long term (current) use of insulin: Secondary | ICD-10-CM | POA: Diagnosis not present

## 2019-08-06 DIAGNOSIS — J129 Viral pneumonia, unspecified: Secondary | ICD-10-CM | POA: Insufficient documentation

## 2019-08-06 LAB — BASIC METABOLIC PANEL
Anion gap: 13 (ref 5–15)
BUN: 15 mg/dL (ref 6–20)
CO2: 25 mmol/L (ref 22–32)
Calcium: 9.6 mg/dL (ref 8.9–10.3)
Chloride: 100 mmol/L (ref 98–111)
Creatinine, Ser: 1.06 mg/dL (ref 0.61–1.24)
GFR calc Af Amer: >60 mL/min (ref >60)
GFR calc non Af Amer: >60 mL/min (ref >60)
Glucose, Bld: 153 mg/dL — ABNORMAL HIGH (ref 70–99)
Potassium: 4.1 mmol/L (ref 3.5–5.1)
Sodium: 138 mmol/L (ref 135–145)

## 2019-08-06 LAB — CBC
HCT: 46.2 % (ref 39.0–52.0)
Hemoglobin: 15.1 g/dL (ref 13.0–17.0)
MCH: 28.8 pg (ref 26.0–34.0)
MCHC: 32.7 g/dL (ref 30.0–36.0)
MCV: 88.2 fL (ref 80.0–100.0)
Platelets: 458 10*3/uL — ABNORMAL HIGH (ref 150–400)
RBC: 5.24 MIL/uL (ref 4.22–5.81)
RDW: 13.5 % (ref 11.5–15.5)
WBC: 5.3 10*3/uL (ref 4.0–10.5)
nRBC: 0 % (ref 0.0–0.2)

## 2019-08-06 IMAGING — CT CT ANGIO CHEST
1 series · 1 of 1 positions shown · IV contrast (omnipaque)
Comparison: Chest radiograph dated [DATE].

CLINICAL DATA: 43-year-old male with chest pain. Recent diagnosis
of [F4].

EXAM:
CT ANGIOGRAPHY CHEST WITH CONTRAST
TECHNIQUE: Multidetector CT imaging of the chest was performed using the
standard protocol during bolus administration of intravenous
contrast. Multiplanar CT image reconstructions and MIPs were
obtained to evaluate the vascular anatomy.
CONTRAST:  100mL OMNIPAQUE IOHEXOL 350 MG/ML SOLN

[Series 1: topogram 0.6 t20f · sagittal · 1.00mm/px · 1 of 1 slices shown]
[im 1/1]
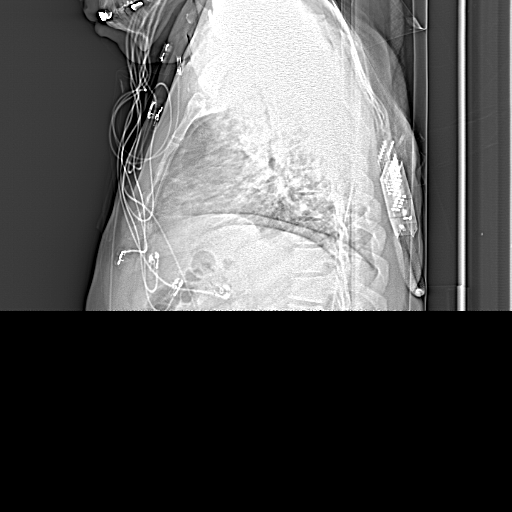

[1 of 1 positions shown; findings below may reference images not displayed]

FINDINGS: Cardiovascular: Borderline cardiomegaly. No pericardial effusion.
The thoracic aorta is unremarkable. Evaluation of the pulmonary
arteries is limited due to respiratory motion artifact and
suboptimal opacification and visualization of the peripheral
branches. No pulmonary artery embolus identified.

Mediastinum/Nodes: Mildly enlarged right hilar lymph nodes measure
up to 18 mm. The esophagus and the thyroid gland are grossly
unremarkable. No mediastinal fluid collection.

Lungs/Pleura: Diffuse bilateral patchy airspace opacities most
consistent with multifocal pneumonia, likely viral or atypical in
etiology and in keeping with known [F4]. Clinical correlation
and follow-up recommended. There is no pleural effusion or
pneumothorax. The central airways are patent.

Upper Abdomen: Probable fatty infiltration of the liver.

Musculoskeletal: No chest wall abnormality. No acute or significant
osseous findings.

Review of the MIP images confirms the above findings.
IMPRESSION: 1. No CT evidence of pulmonary artery embolus.
2. Multifocal pneumonia, likely viral or atypical in etiology and in
keeping with known [F4]. Clinical correlation and follow-up
recommended.

## 2019-08-06 IMAGING — DX DG CHEST 2V
2 series · 2 of 2 positions shown · non-contrast
Comparison: [DATE]

CLINICAL DATA: Shortness breath and headache. Recent [K9]
positive

EXAM:
CHEST - 2 VIEW

[chest pa]
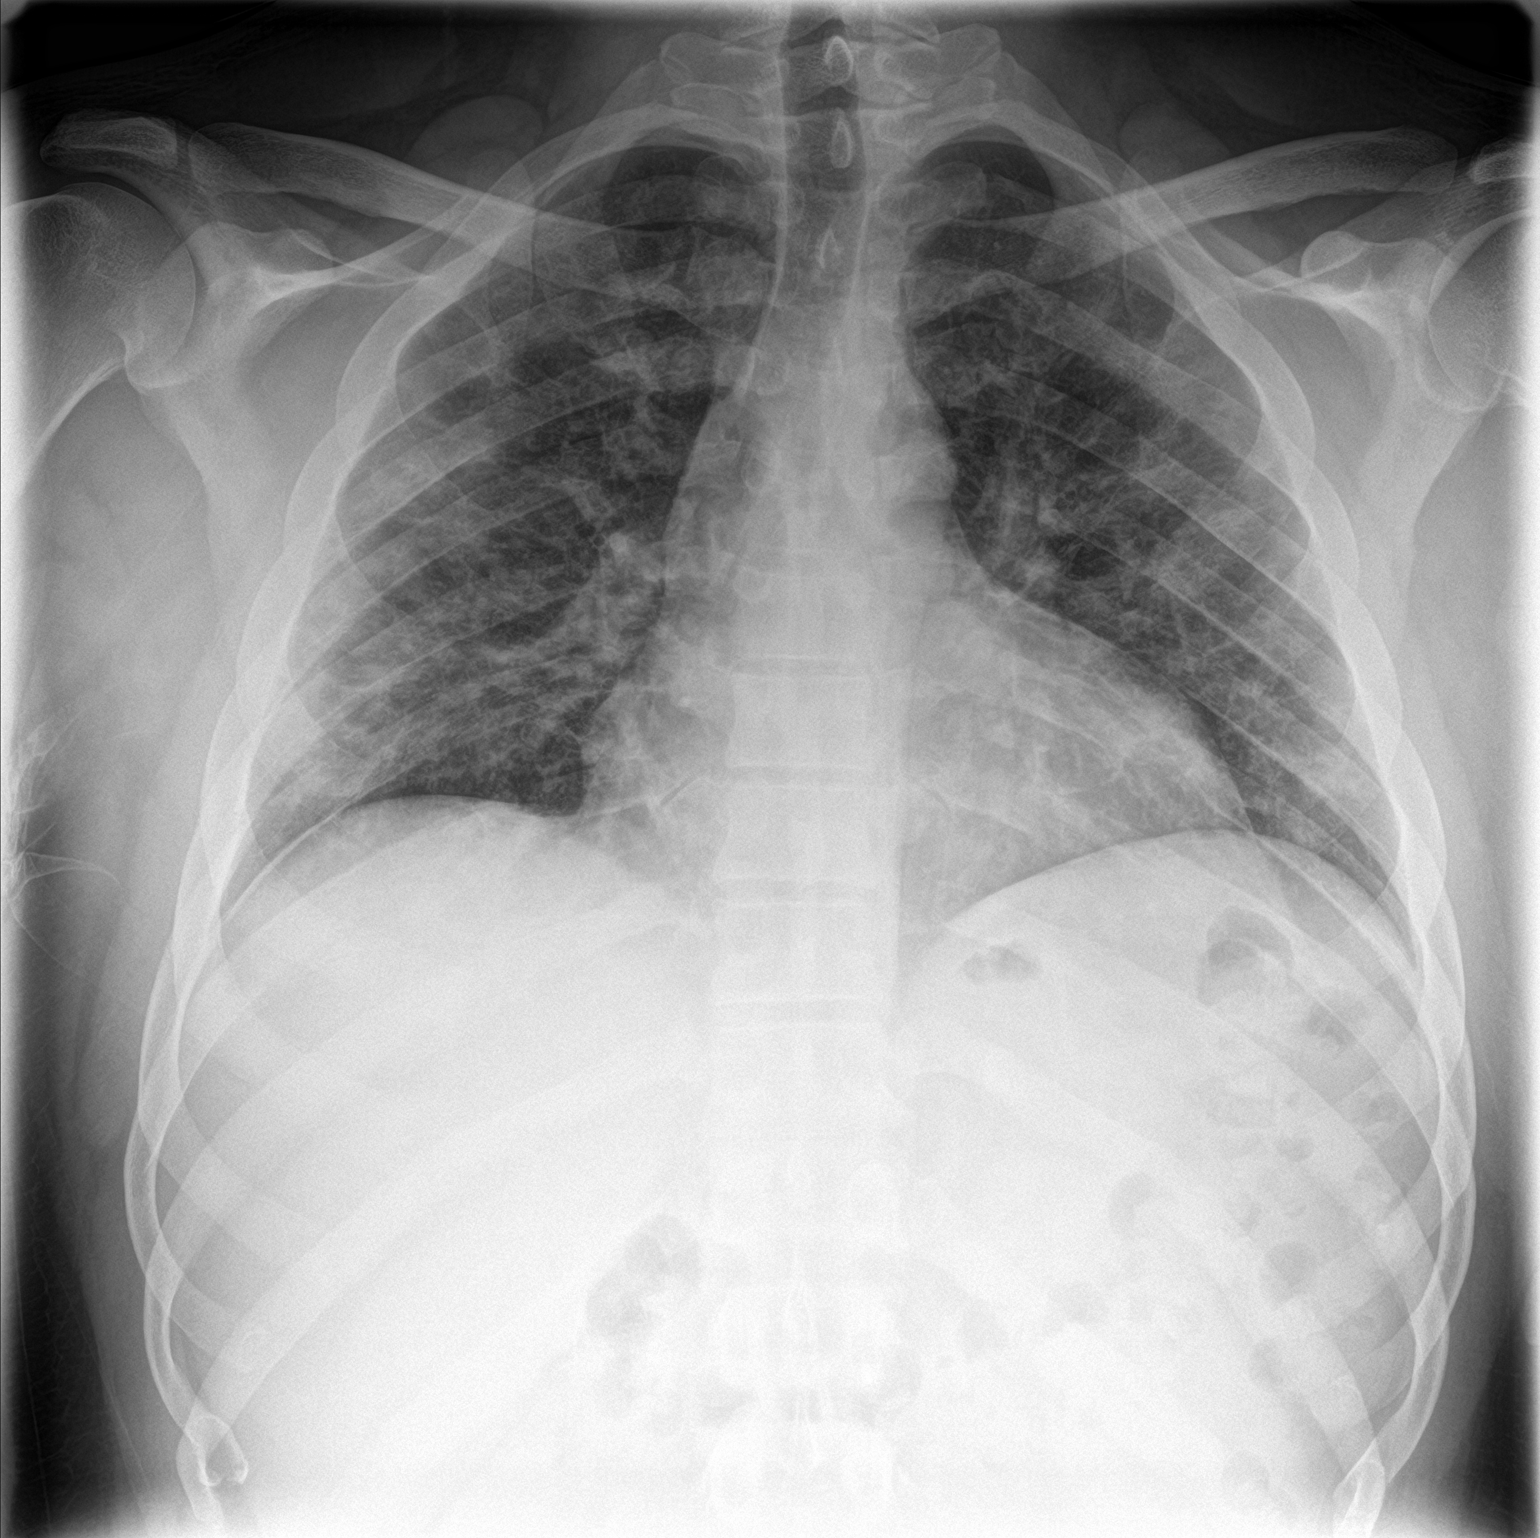

[chest lat]
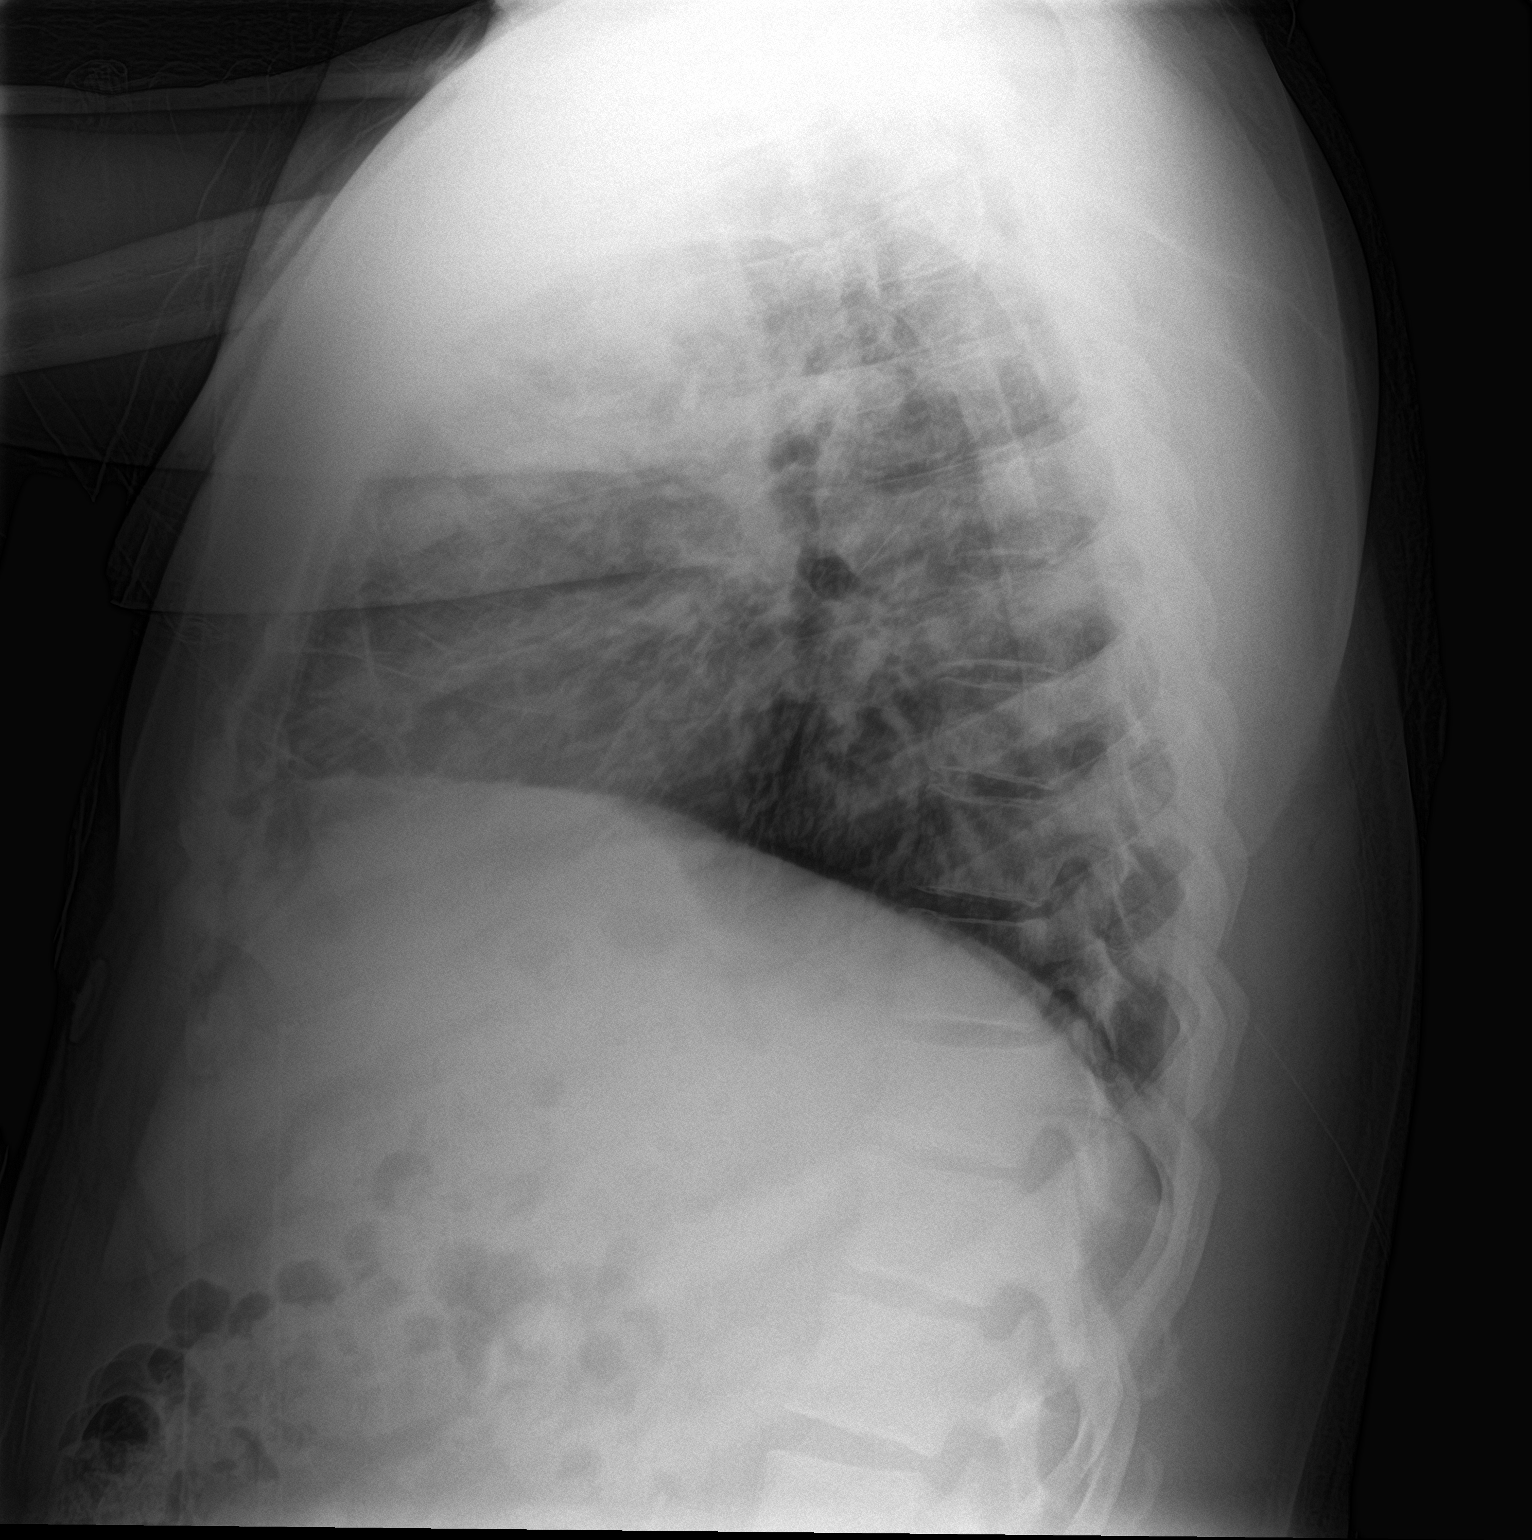

[2 of 2 positions shown; findings below may reference images not displayed]

FINDINGS: There is ill-defined airspace opacity in both upper and lower lung
regions, slightly more on the right than on the left. Heart is upper
normal in size with pulmonary vascularity normal. No adenopathy. No
bone lesions.
IMPRESSION: Multifocal areas of ill-defined airspace opacity, slightly more
overall on the right than the left, likely representing atypical
organism multifocal pneumonia. Heart upper normal in size. No
evident adenopathy.

## 2019-08-06 IMAGING — CT CT ANGIO CHEST
2 of 6 series · 18 of 46 positions shown · IV contrast (omnipaque)
Comparison: Chest radiograph dated [DATE].

CLINICAL DATA: 43-year-old male with chest pain. Recent diagnosis
of [F4].

EXAM:
CT ANGIOGRAPHY CHEST WITH CONTRAST
TECHNIQUE: Multidetector CT imaging of the chest was performed using the
standard protocol during bolus administration of intravenous
contrast. Multiplanar CT image reconstructions and MIPs were
obtained to evaluate the vascular anatomy.
CONTRAST:  100mL OMNIPAQUE IOHEXOL 350 MG/ML SOLN

[Series 7: thins · axial · 0.76mm/px · z∈[+1224,+1476]mm · 15 of 278 slices shown]
[im 13/278  lung]
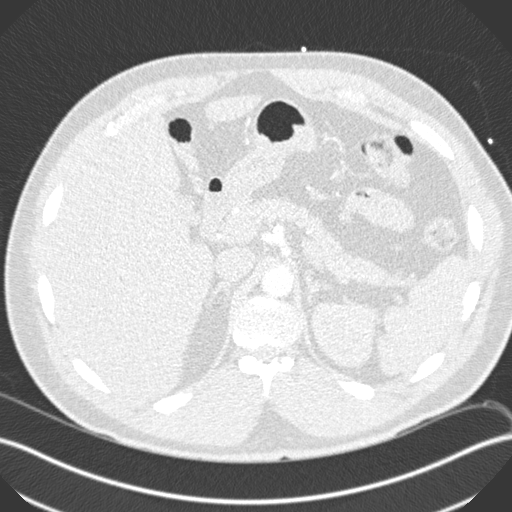
[im 37/278  soft-tissue]
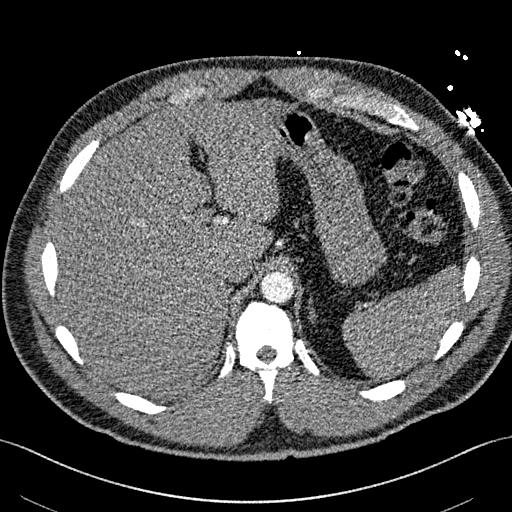
[im 49/278  lung]
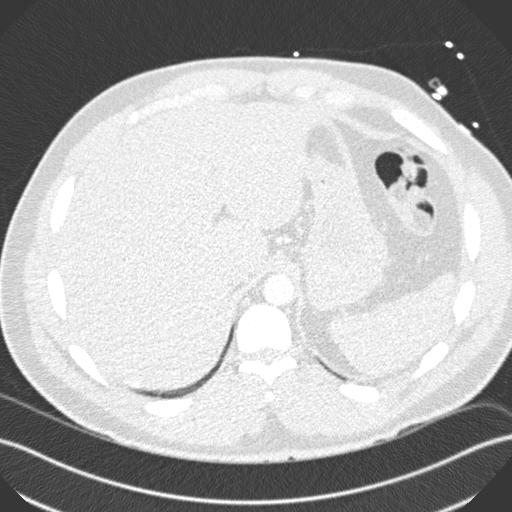
[im 73/278  soft-tissue]
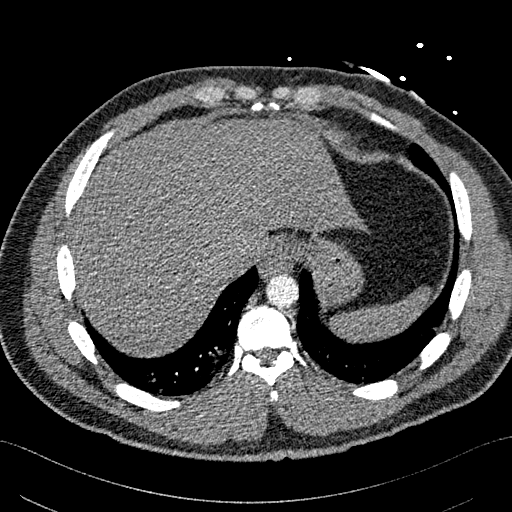
[im 85/278  lung]
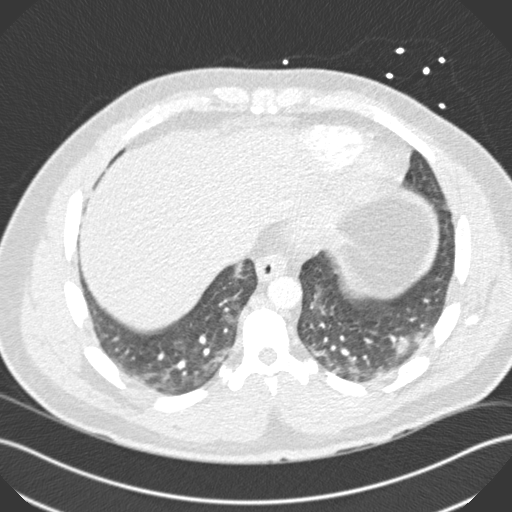
[im 109/278  soft-tissue]
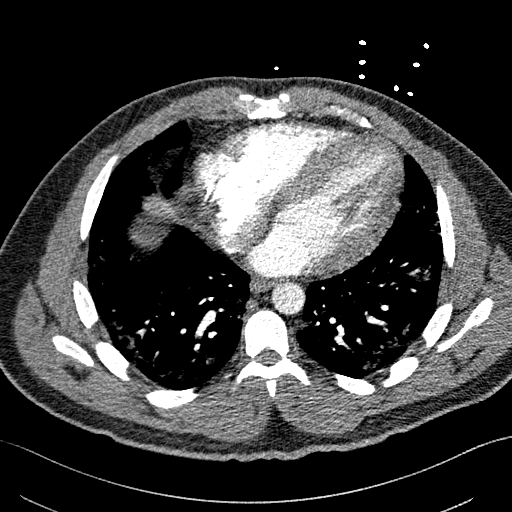
[im 121/278  lung]
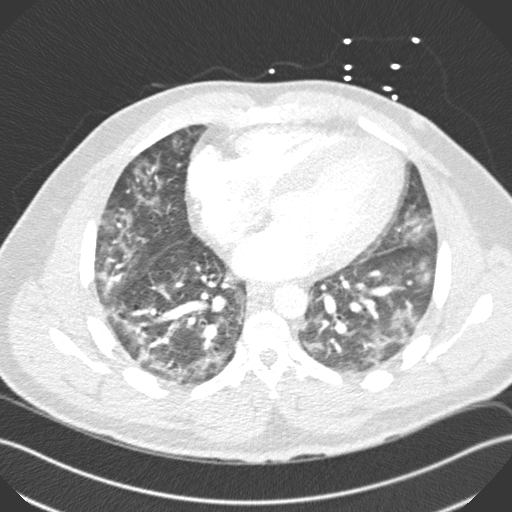
[im 145/278  soft-tissue]
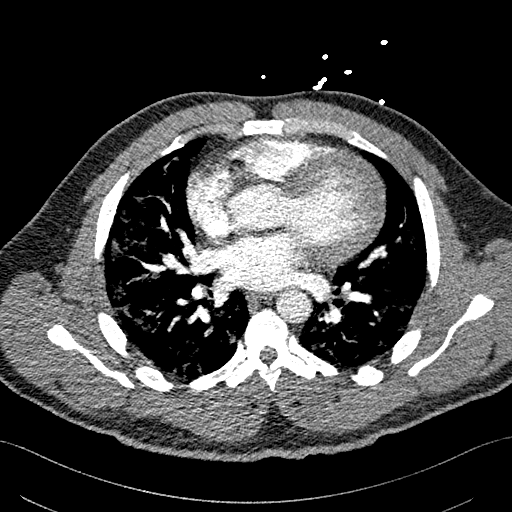
[im 157/278  lung]
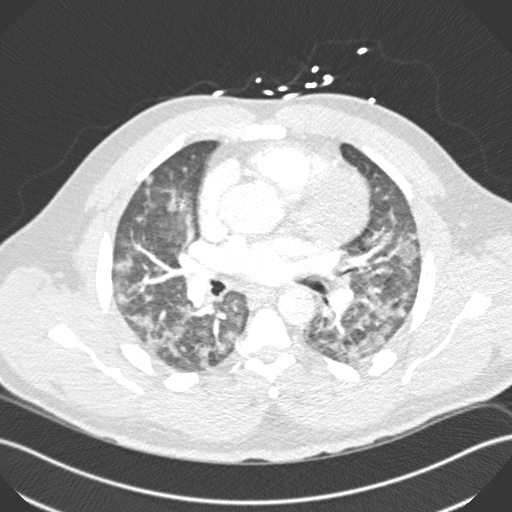
[im 169/278  soft-tissue]
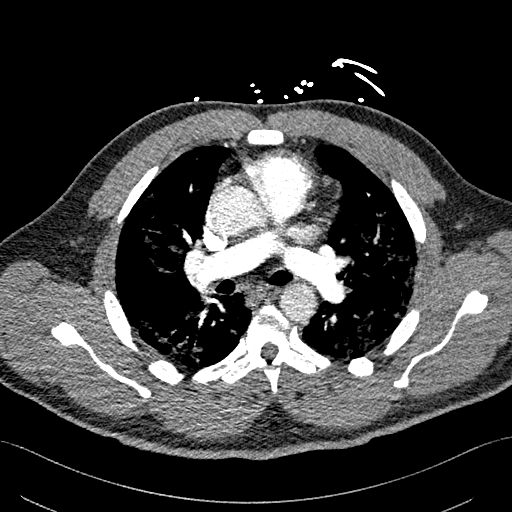
[im 193/278  lung]
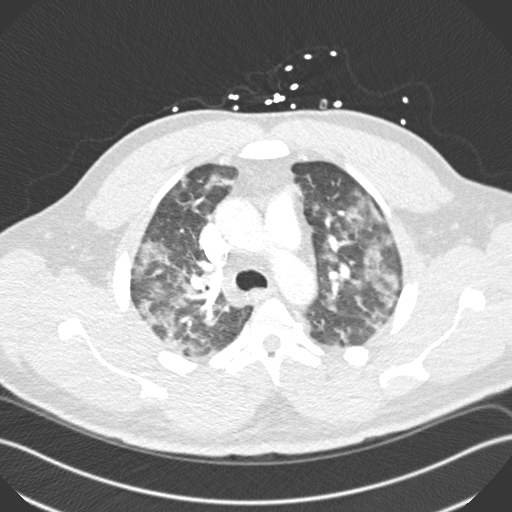
[im 205/278  soft-tissue]
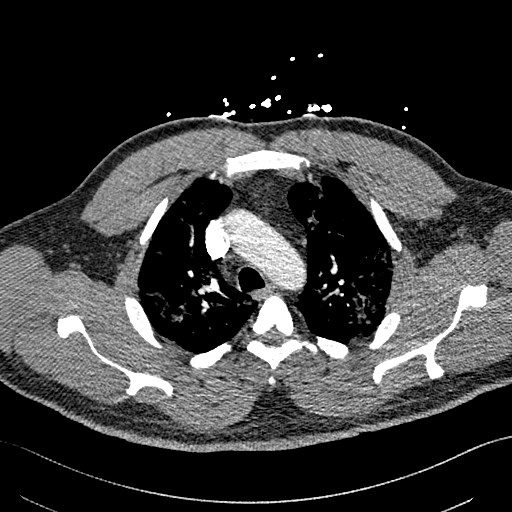
[im 229/278  lung]
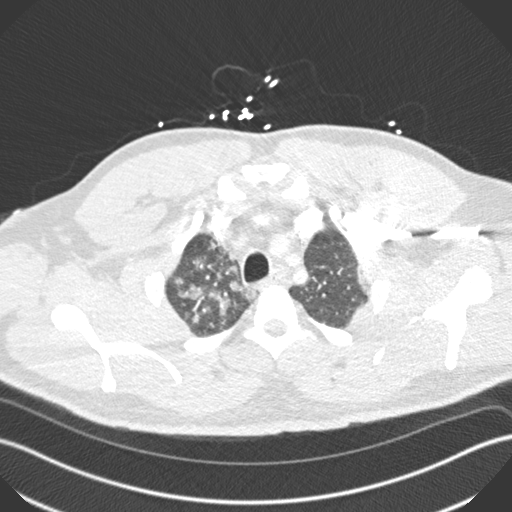
[im 241/278  soft-tissue]
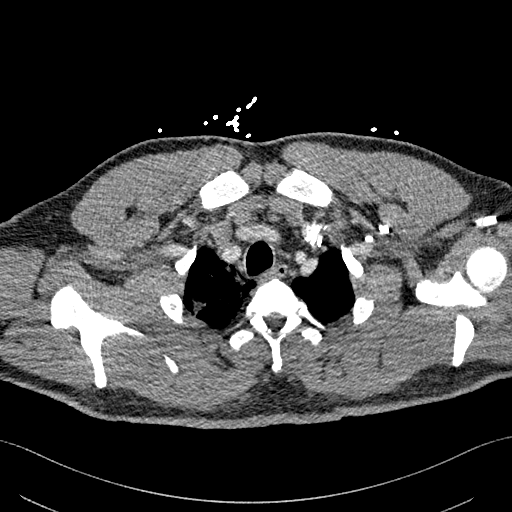
[im 265/278  lung]
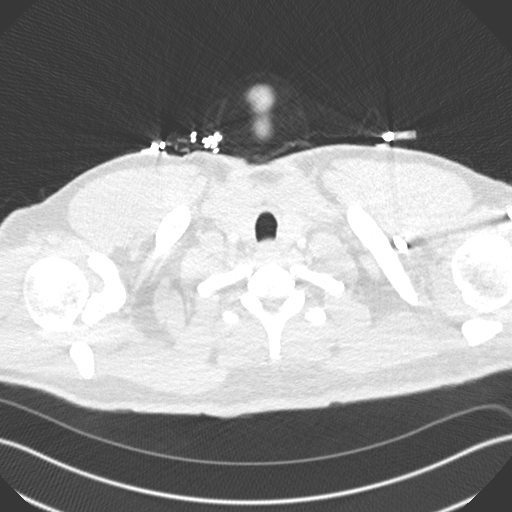

[Series 9: coronal mpr · coronal · 0.55mm/px · 3 of 151 slices shown]
[im 38/151  soft-tissue]
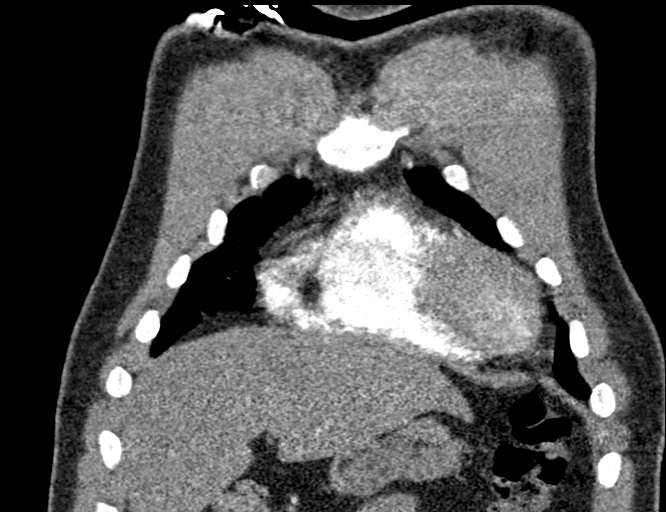
[im 76/151  soft-tissue]
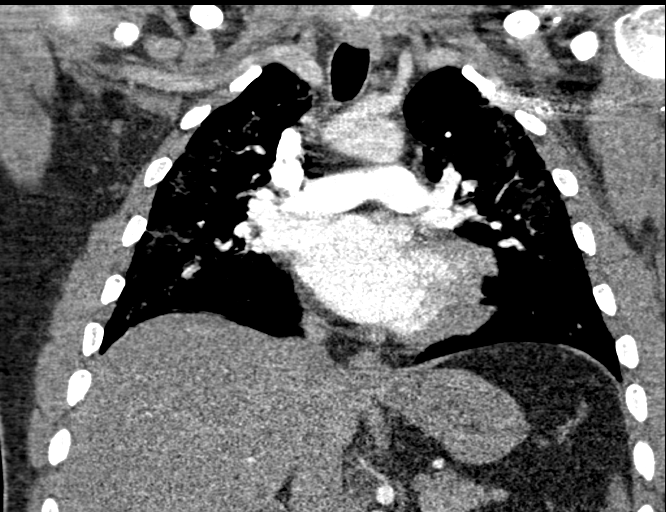
[im 113/151  soft-tissue]
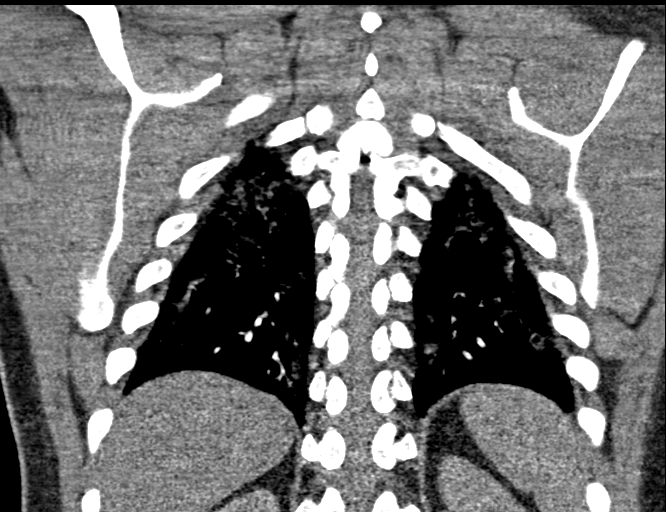

[18 of 46 positions shown; findings below may reference images not displayed]

FINDINGS: Cardiovascular: Borderline cardiomegaly. No pericardial effusion.
The thoracic aorta is unremarkable. Evaluation of the pulmonary
arteries is limited due to respiratory motion artifact and
suboptimal opacification and visualization of the peripheral
branches. No pulmonary artery embolus identified.

Mediastinum/Nodes: Mildly enlarged right hilar lymph nodes measure
up to 18 mm. The esophagus and the thyroid gland are grossly
unremarkable. No mediastinal fluid collection.

Lungs/Pleura: Diffuse bilateral patchy airspace opacities most
consistent with multifocal pneumonia, likely viral or atypical in
etiology and in keeping with known [F4]. Clinical correlation
and follow-up recommended. There is no pleural effusion or
pneumothorax. The central airways are patent.

Upper Abdomen: Probable fatty infiltration of the liver.

Musculoskeletal: No chest wall abnormality. No acute or significant
osseous findings.

Review of the MIP images confirms the above findings.
IMPRESSION: 1. No CT evidence of pulmonary artery embolus.
2. Multifocal pneumonia, likely viral or atypical in etiology and in
keeping with known [F4]. Clinical correlation and follow-up
recommended.

## 2019-08-06 MED ORDER — DIPHENHYDRAMINE HCL 50 MG/ML IJ SOLN
12.5000 mg | Freq: Once | INTRAMUSCULAR | Status: AC
  Administered 2019-08-06: 12.5 mg via INTRAVENOUS
  Filled 2019-08-06: qty 1

## 2019-08-06 MED ORDER — PROCHLORPERAZINE EDISYLATE 10 MG/2ML IJ SOLN
10.0000 mg | Freq: Once | INTRAMUSCULAR | Status: AC
  Administered 2019-08-06: 10 mg via INTRAVENOUS
  Filled 2019-08-06: qty 2

## 2019-08-06 MED ORDER — LACTATED RINGERS IV BOLUS
1000.0000 mL | Freq: Once | INTRAVENOUS | Status: AC
  Administered 2019-08-06: 1000 mL via INTRAVENOUS

## 2019-08-06 MED ORDER — KETOROLAC TROMETHAMINE 15 MG/ML IJ SOLN
15.0000 mg | Freq: Once | INTRAMUSCULAR | Status: AC
  Administered 2019-08-06: 15 mg via INTRAVENOUS
  Filled 2019-08-06: qty 1

## 2019-08-06 MED ORDER — IOHEXOL 350 MG/ML SOLN
100.0000 mL | Freq: Once | INTRAVENOUS | Status: AC | PRN
  Administered 2019-08-06: 20:00:00 100 mL via INTRAVENOUS

## 2019-08-06 NOTE — ED Notes (Signed)
Pt ambulated around room, O2 dropped to 90% at the lowest, no increased WOB or dyspnea noted.

## 2019-08-06 NOTE — ED Notes (Signed)
IV attempt x2, unsuccessful at this time. Another RN notified to try.

## 2019-08-06 NOTE — ED Notes (Signed)
Pt transported to CT ?

## 2019-08-06 NOTE — ED Provider Notes (Signed)
[facility] EMERGENCY DEPARTMENT Provider Note   CSN: [id number] Arrival date & time: 08/06/19  1509     History Chief Complaint  Patient presents with  . Shortness of Breath    Christopher Abbott is a 44 y.o. male.  HPI Patient is a 44 year old male with a PMH of type 2 diabetes presenting to the ED today due to difficulty breathing and headache.  Patient tested positive for COVID-19 on 2/27.  He says that since that time, he has experienced shortness of breath and says that it is hard to take a deep breath.  He has associated cough that is productive of mucus but no blood.  The cough worsens when he tries to take deep breaths.  He feels as though the persistent coughing has caused him to experience a headache.  The headache begins in his bilateral temples and wraps around to the back of his head.  He has tried Excedrin Migraine and Tylenol with no improvement in his headache.  He does not have a history of migraines.  Patient was seen in urgent care on 3/7 and was discharged home with a Z-Pak and 3 days of prednisone.  Patient reports that his symptoms initially mildly improved but have since worsened.  He denies chest pain, fever, chills, nausea, vomiting, diarrhea.    Past Medical History:  Diagnosis Date  . Diabetes mellitus    type 2  . GERD (gastroesophageal reflux disease)   . Hypertension     Patient Active Problem List   Diagnosis Date Noted  . Internal hemorrhoid, bleeding 08/29/2017  . Type 2 diabetes mellitus ([place]) 08/06/2017  . 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 Rupture of left Achilles tendon 07/18/2015    Past Surgical History:  Procedure Laterality Date  . ACHILLES TENDON SURGERY Left 07/18/2015   Procedure: LEFT ACHILLES TENDON DIRECT PRIMARY REPAIR;  Surgeon: Mcarthur Rossetti, MD;  Location: [facility];  Service: Orthopedics;  Laterality: Left;  . COLONOSCOPY WITH PROPOFOL N/A 08/14/2017   Procedure: COLONOSCOPY WITH PROPOFOL;  Surgeon:  Lin Landsman, MD;  Location: [facility] ENDOSCOPY;  Service: Gastroenterology;  Laterality: N/A;  . NO PAST SURGERIES         Family History  Problem Relation Age of Onset  . Diabetes Mother   . Diabetes Father     Social History   Tobacco Use  . Smoking status: Current Some Day Smoker    Packs/day: 0.20    Types: Cigarettes  . Smokeless tobacco: Never Used  Substance Use Topics  . Alcohol use: Not Currently    Comment: none now, once per week or less  . Drug use: No    Home Medications Prior to Admission medications   Medication Sig Start Date End Date Taking? Authorizing Provider  ACCU-CHEK FASTCLIX LANCETS MISC USE AS DIRECTED TO CHECK BLOOD SUAGR DX: E11.9 08/28/17   [provider]  ACCU-CHEK GUIDE test strip USE AS DIRECTED TO TEST BLOOD SUGAR TWICE A DAY DX:E11.9 08/29/17   [provider]  azithromycin (ZITHROMAX) 250 MG tablet Take 2 tabs PO x 1 dose, then 1 tab PO QD x 4 days 08/01/19   Scot Jun, FNP  B-D ULTRAFINE III SHORT PEN 31G X 8 MM MISC USE WITH INSULIN PEN INJECTIONS DAILY ( DX: E11.9) 09/17/17   [provider]  benzonatate (TESSALON) 100 MG capsule Take 1 capsule (100 mg total) by mouth every 8 (eight) hours. 07/24/19   Darr, Marguerita Beards, PA-C  Blood Glucose  Monitoring Suppl (ACCU-CHEK GUIDE) w/Device KIT USE AS DIRECTED DX E11.9 (MEDICAID PREFERRED) 08/28/17   [provider]  chlorhexidine (PERIDEX) 0.12 % solution RINSE WITH 1/2 CAPFUL AND SWISH FOR 30 SECONDS, THEN SPIT. USE TWICE A DAY 09/10/17   [provider]  chlorpheniramine-HYDROcodone (TUSSIONEX PENNKINETIC ER) 10-8 MG/5ML SUER Take 5 mLs by mouth every 12 (twelve) hours as needed for cough. 05/06/18   Melynda Ripple, MD  esomeprazole (NEXIUM) 20 MG capsule Take 20 mg by mouth daily at 12 noon.    [provider]  fluticasone (FLONASE) 50 MCG/ACT nasal spray Place 2 sprays into both nostrils daily. 05/06/18   Melynda Ripple, MD    HYDROcodone-homatropine [facility]) 5-1.5 MG/5ML syrup Take 5 mLs by mouth every 6 (six) hours as needed for cough. 07/24/19   Darr, Marguerita Beards, PA-C  hydrocortisone (ANUSOL-HC) 2.5 % rectal cream Apply rectally 2 times daily 02/07/17   Robyn Haber, MD  ibuprofen (ADVIL,MOTRIN) 600 MG tablet Take 1 tablet (600 mg total) by mouth every 6 (six) hours as needed. 09/06/17   Moss, Amber, DO  insulin glargine (LANTUS) 100 UNIT/ML injection Inject 70 Units into the skin daily.     [provider]  JANUMET XR 202 408 0466 MG TB24 Take 1 tablet by mouth daily. 07/10/15   [provider]  LANTUS SOLOSTAR 100 UNIT/ML Solostar Pen INJECT 45 UNITS INTO THE SKIN 4 TIMES A DAY 08/09/17   [provider]  Liraglutide (VICTOZA) 18 MG/3ML SOPN Inject 1.8 mg into the skin daily.    [provider]  lisinopril-hydrochlorothiazide (PRINZIDE,ZESTORETIC) 20-12.5 MG tablet Take 1 tablet by mouth daily. for high blood pressure 07/02/15   [provider]  metFORMIN (GLUCOPHAGE) 1000 MG tablet Take 1,000 mg by mouth 2 (two) times daily with a meal.    [provider]  pravastatin (PRAVACHOL) 40 MG tablet Take 40 mg by mouth every evening. for cholesterol 05/30/15   [provider]  promethazine-dextromethorphan (PROMETHAZINE-DM) 6.25-15 MG/5ML syrup Take 5 mLs by mouth every 6 (six) hours as needed for cough. 08/01/19   Scot Jun, FNP  SUPREP BOWEL PREP KIT 17.5-3.13-1.6 GM/177ML SOLN See admin instructions. 08/12/17   [provider]  Vitamin D, Ergocalciferol, (DRISDOL) 50000 units CAPS capsule TAKE ONE CAPSULE BY MOUTH ONCE A WEEK FOR 12 WEEKS 08/09/17   [provider]    Allergies    Patient has no known allergies.  Review of Systems   Review of Systems  Constitutional: Negative for chills and fever.  HENT: Negative for ear pain, rhinorrhea and sore throat.   Eyes: Negative for pain and visual disturbance.  Respiratory: Positive for cough  and shortness of breath.   Cardiovascular: Negative for chest pain, palpitations and leg swelling.  Gastrointestinal: Negative for abdominal pain, nausea and vomiting.  Genitourinary: Negative for dysuria and hematuria.  Musculoskeletal: Negative for arthralgias and back pain.  Skin: Negative for rash and wound.  Neurological: Positive for headaches. Negative for seizures, syncope and light-headedness.  Psychiatric/Behavioral: Negative for agitation.  All other systems reviewed and are negative.   Physical Exam Updated Vital Signs BP 118/73   Pulse 97   Temp 98.3 F (36.8 C) (Oral)   Resp (!) 25   Ht 6' 4" (1.93 m)   Wt 113.4 kg   SpO2 94%   BMI 30.43 kg/m   Physical Exam Vitals and nursing note reviewed.  Constitutional:      General: He is not in acute distress.    Appearance:  Normal appearance. He is well-developed and normal weight. He is not ill-appearing.  HENT:     Head: Normocephalic and atraumatic.     Right Ear: External ear normal.     Left Ear: External ear normal.     Nose: Nose normal. No congestion or rhinorrhea.     Mouth/Throat:     Mouth: Mucous membranes are moist.     Pharynx: Oropharynx is clear.  Eyes:     Extraocular Movements: Extraocular movements intact.     Pupils: Pupils are equal, round, and reactive to light.  Cardiovascular:     Rate and Rhythm: Regular rhythm. Tachycardia present.     Pulses: Normal pulses.     Heart sounds: Normal heart sounds.  Pulmonary:     Effort: Pulmonary effort is normal. No respiratory distress.     Breath sounds: No stridor. Rhonchi (R lung base) present. No wheezing or rales.  Abdominal:     General: There is no distension.     Palpations: Abdomen is soft.     Tenderness: There is no abdominal tenderness. There is no guarding or rebound.  Musculoskeletal:        General: Normal range of motion.     Cervical back: Normal range of motion and neck supple.     Right lower leg: No edema.     Left lower leg:  No edema.  Skin:    General: Skin is warm and dry.     Capillary Refill: Capillary refill takes less than 2 seconds.  Neurological:     General: No focal deficit present.     Mental Status: He is alert and oriented to person, place, and time. Mental status is at baseline.  Psychiatric:        Mood and Affect: Mood normal.     ED Results / Procedures / Treatments   Labs (all labs ordered are listed, but only abnormal results are displayed) Labs Reviewed  CBC - Abnormal; Notable for the following components:      Result Value   Platelets 458 (*)    All other components within normal limits  BASIC METABOLIC PANEL - Abnormal; Notable for the following components:   Glucose, Bld 153 (*)    All other components within normal limits    EKG None  Radiology DG Chest 2 View  Result Date: 08/06/2019 CLINICAL DATA:  Shortness breath and headache. Recent COVID-19 positive EXAM: CHEST - 2 VIEW COMPARISON:  August 01, 2019 FINDINGS: There is ill-defined airspace opacity in both upper and lower lung regions, slightly more on the right than on the left. Heart is upper normal in size with pulmonary vascularity normal. No adenopathy. No bone lesions. IMPRESSION: Multifocal areas of ill-defined airspace opacity, slightly more overall on the right than the left, likely representing atypical organism multifocal pneumonia. Heart upper normal in size. No evident adenopathy. Electronically Signed   By: Lowella Grip III M.D.   On: 08/06/2019 15:47   CT Angio Chest PE W and/or Wo Contrast  Result Date: 08/06/2019 CLINICAL DATA:  44 year old male with chest pain. Recent diagnosis of COVID-19. EXAM: CT ANGIOGRAPHY CHEST WITH CONTRAST TECHNIQUE: Multidetector CT imaging of the chest was performed using the standard protocol during bolus administration of intravenous contrast. Multiplanar CT image reconstructions and MIPs were obtained to evaluate the vascular anatomy. CONTRAST:  189m OMNIPAQUE IOHEXOL 350  MG/ML SOLN COMPARISON:  Chest radiograph dated 08/06/2019. FINDINGS: Cardiovascular: Borderline cardiomegaly. No pericardial effusion. The thoracic aorta is unremarkable. Evaluation of  the pulmonary arteries is limited due to respiratory motion artifact and suboptimal opacification and visualization of the peripheral branches. No pulmonary artery embolus identified. Mediastinum/Nodes: Mildly enlarged right hilar lymph nodes measure up to 18 mm. The esophagus and the thyroid gland are grossly unremarkable. No mediastinal fluid collection. Lungs/Pleura: Diffuse bilateral patchy airspace opacities most consistent with multifocal pneumonia, likely viral or atypical in etiology and in keeping with known COVID-19. Clinical correlation and follow-up recommended. There is no pleural effusion or pneumothorax. The central airways are patent. Upper Abdomen: Probable fatty infiltration of the liver. Musculoskeletal: No chest wall abnormality. No acute or significant osseous findings. Review of the MIP images confirms the above findings. IMPRESSION: 1. No CT evidence of pulmonary artery embolus. 2. Multifocal pneumonia, likely viral or atypical in etiology and in keeping with known COVID-19. Clinical correlation and follow-up recommended. Electronically Signed   By: Anner Crete M.D.   On: 08/06/2019 19:59    Procedures Procedures (including critical care time)  Medications Ordered in ED Medications  lactated ringers bolus 1,000 mL (0 mLs Intravenous Stopped 08/06/19 2051)  prochlorperazine (COMPAZINE) injection 10 mg (10 mg Intravenous Given 08/06/19 1906)  diphenhydrAMINE (BENADRYL) injection 12.5 mg (12.5 mg Intravenous Given 08/06/19 1907)  ketorolac (TORADOL) 15 MG/ML injection 15 mg (15 mg Intravenous Given 08/06/19 1906)  iohexol (OMNIPAQUE) 350 MG/ML injection 100 mL (100 mLs Intravenous Contrast Given 08/06/19 1944)    ED Course  I have reviewed the triage vital signs and the nursing notes.  Pertinent  labs & imaging results that were available during my care of the patient were reviewed by me and considered in my medical decision making (see chart for details).    MDM Rules/Calculators/A&P                     Patient is a 44 year old male with a PMH of type 2 diabetes presenting to the ED today due to difficulty breathing, cough and headache.  On exam, patient has rhonchi in right lower lung base.  BP 118/82, HR 123, RR 20, SPO2 96% on room air.  Afebrile.  On arrival, patient appears generally well and is displaying no signs of acute distress.  Patient is concerned about the persistent nature of his cough and shortness of breath after he tested positive for COVID-19 2 weeks ago.  He says that all of his other symptoms have improved.  He has completed a Z-Pak and 3-day steroid burst with initial mild improvement in his symptoms but says that the cough and shortness of breath have since worsened.  Headache appears to be related to cough.  Provided 1 L IV fluids, Compazine, Benadryl and Toradol for headache.  Obtained chest x-ray which showed "multifocal areas of ill-defined airspace opacity, slightly more overall on the right than the left, likely representing atypical organism multifocal pneumonia."  CBC and BMP unremarkable.  Although patient denies chest pain, he has borderline tachycardic and SPO2 noted to be 90% on room air.  Obtained CTA PE study which showed no evidence of PE and reiterated the viral or atypical pneumonia.  Imaging not concerning for superimposed bacterial pneumonia.  No pneumothorax noted on either chest x-ray or CTA.  Given the fact the patient has already completed a course of antibiotics and steroid burst, we do not feel the need for any further antibiotic or steroid treatment.  Patient likely experiencing prolonged symptoms from COVID-19 infection.  On reassessment, patient reports mild improvement in his headache as long as he  is not coughing.  Patient ambulated with SPO2 90%  at the lowest.  At this time, patient appears stable for discharge.  Patient to be discharged home.  Provided strict return precautions including chest pain, worsening shortness of breath or lightheadedness.  Encouraged him to follow-up with his PCP in 2 to 3 days for follow-up.  Patient in agreement with plan.  Patient stable at time of discharge.  Patient assessed and evaluated with Dr. Laverta Baltimore.  Nadeen Landau, MD   Final Clinical Impression(s) / ED Diagnoses Final diagnoses:  Viral pneumonia    Rx / DC Orders ED Discharge Orders    None       Nadeen Landau, MD 08/07/19 [id number]    Margette Fast, MD 08/07/19 1012

## 2019-08-06 NOTE — ED Triage Notes (Signed)
Pt endorses SOB and severe HA. States he has been to UC and given steroids and abx with no relief. Reports he had covid over 2 weeks ago.

## 2019-11-21 ENCOUNTER — Other Ambulatory Visit: Payer: Self-pay

## 2019-11-21 ENCOUNTER — Ambulatory Visit (HOSPITAL_COMMUNITY)
Admission: EM | Admit: 2019-11-21 | Discharge: 2019-11-21 | Disposition: A | Discharge status: Home / Self Care | Payer: Medicaid Other | Attending: Family Medicine | Admitting: Family Medicine

## 2019-11-21 ENCOUNTER — Encounter (HOSPITAL_COMMUNITY): Payer: Self-pay

## 2019-11-21 DIAGNOSIS — R220 Localized swelling, mass and lump, head: Secondary | ICD-10-CM | POA: Diagnosis not present

## 2019-11-21 DIAGNOSIS — K0889 Other specified disorders of teeth and supporting structures: Secondary | ICD-10-CM

## 2019-11-21 MED ORDER — HYDROCODONE-ACETAMINOPHEN 5-325 MG PO TABS: 2.0000 | ORAL_TABLET | ORAL | 0 refills | Status: DC | PRN

## 2019-11-21 MED ORDER — AMOXICILLIN-POT CLAVULANATE 875-125 MG PO TABS: 1.0000 | ORAL_TABLET | Freq: Two times a day (BID) | ORAL | 0 refills | Status: AC

## 2019-11-21 NOTE — ED Provider Notes (Signed)
[facility]   [id number] 11/21/19 Arrival Time: 1007  CC: DENTAL pain  SUBJECTIVE:  Westen Dinino is a 44 y.o. male who presents with dental pain and facial swelling x 2 days. Denies a precipitating event or trauma. Localizes pain to left upper jaw. Has tried OTC analgesics without relief. Worse with chewing. Does see a dentist regularly. Denies similar symptoms in the past. Denies fever, chills, dysphagia, odynophagia, nausea, vomiting, chest pain, SOB.    ROS: As per HPI.  All other pertinent ROS negative.     Past Medical History:  Diagnosis Date  . Diabetes mellitus    type 2  . GERD (gastroesophageal reflux disease)   . Hypertension    Past Surgical History:  Procedure Laterality Date  . ACHILLES TENDON SURGERY Left 07/18/2015   Procedure: LEFT ACHILLES TENDON DIRECT PRIMARY REPAIR;  Surgeon: Mcarthur Rossetti, MD;  Location: [place];  Service: Orthopedics;  Laterality: Left;  . COLONOSCOPY WITH PROPOFOL N/A 08/14/2017   Procedure: COLONOSCOPY WITH PROPOFOL;  Surgeon: Lin Landsman, MD;  Location: [facility] ENDOSCOPY;  Service: Gastroenterology;  Laterality: N/A;  . NO PAST SURGERIES     No Known Allergies No current facility-administered medications on file prior to encounter.   Current Outpatient Medications on File Prior to Encounter  Medication Sig Dispense Refill  . ACCU-CHEK FASTCLIX LANCETS MISC USE AS DIRECTED TO CHECK BLOOD SUAGR DX: E11.9  11  . ACCU-CHEK GUIDE test strip USE AS DIRECTED TO TEST BLOOD SUGAR TWICE A DAY DX:E11.9  11  . azithromycin (ZITHROMAX) 250 MG tablet Take 2 tabs PO x 1 dose, then 1 tab PO QD x 4 days 6 tablet 0  . B-D ULTRAFINE III SHORT PEN 31G X 8 MM MISC USE WITH INSULIN PEN INJECTIONS DAILY ( DX: E11.9)  11  . benzonatate (TESSALON) 100 MG capsule Take 1 capsule (100 mg total) by mouth every 8 (eight) hours. 21 capsule 0  . Blood Glucose Monitoring Suppl (ACCU-CHEK GUIDE) w/Device KIT USE AS DIRECTED DX E11.9 (MEDICAID  PREFERRED)  0  . chlorhexidine (PERIDEX) 0.12 % solution RINSE WITH 1/2 CAPFUL AND SWISH FOR 30 SECONDS, THEN SPIT. USE TWICE A DAY  0  . chlorpheniramine-HYDROcodone (TUSSIONEX PENNKINETIC ER) 10-8 MG/5ML SUER Take 5 mLs by mouth every 12 (twelve) hours as needed for cough. 60 mL 0  . esomeprazole (NEXIUM) 20 MG capsule Take 20 mg by mouth daily at 12 noon.    . fluticasone (FLONASE) 50 MCG/ACT nasal spray Place 2 sprays into both nostrils daily. 16 g 0  . HYDROcodone-homatropine (HYCODAN) 5-1.5 MG/5ML syrup Take 5 mLs by mouth every 6 (six) hours as needed for cough. 120 mL 0  . hydrocortisone (ANUSOL-HC) 2.5 % rectal cream Apply rectally 2 times daily 28 g 2  . ibuprofen (ADVIL,MOTRIN) 600 MG tablet Take 1 tablet (600 mg total) by mouth every 6 (six) hours as needed. 30 tablet 0  . insulin glargine (LANTUS) 100 UNIT/ML injection Inject 70 Units into the skin daily.     Marland Kitchen JANUMET XR [phone] MG TB24 Take 1 tablet by mouth daily.  3  . LANTUS SOLOSTAR 100 UNIT/ML Solostar Pen INJECT 45 UNITS INTO THE SKIN 4 TIMES A DAY  3  . Liraglutide (VICTOZA) 18 MG/3ML SOPN Inject 1.8 mg into the skin daily.    Marland Kitchen lisinopril-hydrochlorothiazide (PRINZIDE,ZESTORETIC) 20-12.5 MG tablet Take 1 tablet by mouth daily. for high blood pressure  5  . metFORMIN (GLUCOPHAGE) 1000 MG tablet Take 1,000 mg by mouth  2 (two) times daily with a meal.    . pravastatin (PRAVACHOL) 40 MG tablet Take 40 mg by mouth every evening. for cholesterol  1  . promethazine-dextromethorphan (PROMETHAZINE-DM) 6.25-15 MG/5ML syrup Take 5 mLs by mouth every 6 (six) hours as needed for cough. 140 mL 0  . SUPREP BOWEL PREP KIT 17.5-3.13-1.6 GM/177ML SOLN See admin instructions.  0  . Vitamin D, Ergocalciferol, (DRISDOL) 50000 units CAPS capsule TAKE ONE CAPSULE BY MOUTH ONCE A WEEK FOR 12 WEEKS  3   Social History   Socioeconomic History  . Marital status: Single    Spouse name: Not on file  . Number of children: Not on file  . Years of  education: Not on file  . Highest education level: Not on file  Occupational History  . Not on file  Tobacco Use  . Smoking status: Current Some Day Smoker    Packs/day: 0.20    Types: Cigarettes  . Smokeless tobacco: Never Used  Substance and Sexual Activity  . Alcohol use: Not Currently    Comment: none now, once per week or less  . Drug use: No  . Sexual activity: Yes  Other Topics Concern  . Not on file  Social History Narrative  . Not on file   Social Determinants of Health   Financial Resource Strain:   . Difficulty of Paying Living Expenses:   Food Insecurity:   . Worried About Charity fundraiser in the Last Year:   . Arboriculturist in the Last Year:   Transportation Needs:   . Film/video editor (Medical):   Marland Kitchen Lack of Transportation (Non-Medical):   Physical Activity:   . Days of Exercise per Week:   . Minutes of Exercise per Session:   Stress:   . Feeling of Stress :   Social Connections:   . Frequency of Communication with Friends and Family:   . Frequency of Social Gatherings with Friends and Family:   . Attends Religious Services:   . Active Member of Clubs or Organizations:   . Attends Archivist Meetings:   Marland Kitchen Marital Status:   Intimate Partner Violence:   . Fear of Current or Ex-Partner:   . Emotionally Abused:   Marland Kitchen Physically Abused:   . Sexually Abused:    Family History  Problem Relation Age of Onset  . Diabetes Mother   . Diabetes Father     OBJECTIVE:  Vitals:   11/21/19 1024  BP: 138/85  Pulse: 89  Resp: 18  Temp: 98.2 F (36.8 C)  TempSrc: Oral  SpO2: 100%    General appearance: alert; no distress HENT: normocephalic; atraumatic; dentition: good; good dentition over right upper gums without areas of fluctuance Neck: supple without LAD Lungs: normal respirations Skin: warm and dry Psychological: alert and cooperative; normal mood and affect  ASSESSMENT & PLAN:  1. Pain, dental   2. Facial swelling      Meds ordered this encounter  Medications  . HYDROcodone-acetaminophen (NORCO/VICODIN) 5-325 MG tablet    Sig: Take 2 tablets by mouth every 4 (four) hours as needed.    Dispense:  10 tablet    Refill:  0    Order Specific Question:   Supervising Provider    Answer:   Chase Picket [id number]  . amoxicillin-clavulanate (AUGMENTIN) 875-125 MG tablet    Sig: Take 1 tablet by mouth 2 (two) times daily for 10 days.    Dispense:  20 tablet  Refill:  0    Order Specific Question:   Supervising Provider    Answer:   Chase Picket [id number]    Augmentin prescribed Norco prescribed.  Use as directed for pain relief Recommend soft diet until evaluated by dentist Maintain oral hygiene care Follow up with dentist as soon as possible for further evaluation and treatment  Return or go to the ED if you have any new or worsening symptoms such as fever, chills, difficulty swallowing, painful swallowing, oral or neck swelling, nausea, vomiting, chest pain, SOB.  Reviewed expectations re: course of current medical issues. Questions answered. Outlined signs and symptoms indicating need for more acute intervention. Patient verbalized understanding. After Visit Summary given.    Faustino Congress, NP 11/21/19 1036

## 2019-11-21 NOTE — Discharge Instructions (Signed)
Take the antibiotic as prescribed.    Please call to make an appointment with a dentist as soon as possible.    Return here or go to the emergency department if you develop increased pain, swelling, fever, chills, or other concerning symptoms.

## 2019-11-23 ENCOUNTER — Ambulatory Visit (HOSPITAL_COMMUNITY)
Admission: EM | Admit: 2019-11-23 | Discharge: 2019-11-23 | Disposition: A | Discharge status: Home / Self Care | Payer: Medicaid Other | Attending: Nurse Practitioner | Admitting: Nurse Practitioner

## 2019-11-23 ENCOUNTER — Other Ambulatory Visit: Payer: Self-pay

## 2019-11-23 DIAGNOSIS — J069 Acute upper respiratory infection, unspecified: Secondary | ICD-10-CM

## 2019-11-23 MED ORDER — PROMETHAZINE-DM 6.25-15 MG/5ML PO SYRP: 5.0000 mL | ORAL_SOLUTION | ORAL | 0 refills | Status: DC | PRN

## 2019-11-23 MED ORDER — PREDNISONE 20 MG PO TABS: 20.0000 mg | ORAL_TABLET | Freq: Every day | ORAL | 0 refills | Status: AC

## 2019-11-23 NOTE — ED Provider Notes (Signed)
[facility]    CSN: [id number] Arrival date & time: 11/23/19  1121      History   Chief Complaint No chief complaint on file.   HPI Carlisle Enke is a 44 y.o. male.   Subjective:   Maurilio Puryear is a 44 y.o. male here for evaluation of a cough.  The cough is non-productive, without wheezing, dyspnea or hemoptysis, productive of green/yellow sputum, chest is painful during coughing, and is aggravated by fumes and pollens. Onset of symptoms was 3 days ago and has been gradually worsening since that time.  He denies any fevers, chills, body aches, nausea, vomiting, headache or dizziness.  He is currently on a course of Augmentin for dental pain which was prescribed here on 11/21/2019.  Patient does not have a history of asthma. Patient has not had recent travel. Patient does have a history of smoking. Patient works as a Development worker, international aid. He has a history of COVID-19 in March 2021 and has also had the COVID-19 vaccine.   The following portions of the patient's history were reviewed and updated as appropriate: allergies, current medications, past family history, past medical history, past social history, past surgical history and problem list.       Past Medical History:  Diagnosis Date   Diabetes mellitus    type 2   GERD (gastroesophageal reflux disease)    Hypertension     Patient Active Problem List   Diagnosis Date Noted   Internal hemorrhoid, bleeding 08/29/2017   Type 2 diabetes mellitus ([place]) 08/06/2017   Achilles rupture, left 07/18/2015   Rupture of left Achilles tendon 07/18/2015    Past Surgical History:  Procedure Laterality Date   ACHILLES TENDON SURGERY Left 07/18/2015   Procedure: LEFT ACHILLES TENDON DIRECT PRIMARY REPAIR;  Surgeon: Mcarthur Rossetti, MD;  Location: [facility];  Service: Orthopedics;  Laterality: Left;   COLONOSCOPY WITH PROPOFOL N/A 08/14/2017   Procedure: COLONOSCOPY WITH PROPOFOL;  Surgeon: Lin Landsman, MD;   Location: [facility] ENDOSCOPY;  Service: Gastroenterology;  Laterality: N/A;   NO PAST SURGERIES         Home Medications    Prior to Admission medications   Medication Sig Start Date End Date Taking? Authorizing Provider  ACCU-CHEK FASTCLIX LANCETS MISC USE AS DIRECTED TO CHECK BLOOD SUAGR DX: E11.9 08/28/17   [provider]  ACCU-CHEK GUIDE test strip USE AS DIRECTED TO TEST BLOOD SUGAR TWICE A DAY DX:E11.9 08/29/17   [provider]  amoxicillin-clavulanate (AUGMENTIN) 875-125 MG tablet Take 1 tablet by mouth 2 (two) times daily for 10 days. 11/21/19 12/01/19  Faustino Congress, NP  azithromycin (ZITHROMAX) 250 MG tablet Take 2 tabs PO x 1 dose, then 1 tab PO QD x 4 days 08/01/19   Scot Jun, FNP  B-D ULTRAFINE III SHORT PEN 31G X 8 MM MISC USE WITH INSULIN PEN INJECTIONS DAILY ( DX: E11.9) 09/17/17   [provider]  benzonatate (TESSALON) 100 MG capsule Take 1 capsule (100 mg total) by mouth every 8 (eight) hours. 07/24/19   Darr, Marguerita Beards, PA-C  Blood Glucose Monitoring Suppl (ACCU-CHEK GUIDE) w/Device KIT USE AS DIRECTED DX E11.9 (MEDICAID PREFERRED) 08/28/17   [provider]  chlorhexidine (PERIDEX) 0.12 % solution RINSE WITH 1/2 CAPFUL AND SWISH FOR 30 SECONDS, THEN SPIT. USE TWICE A DAY 09/10/17   [provider]  chlorpheniramine-HYDROcodone (TUSSIONEX PENNKINETIC ER) 10-8 MG/5ML SUER Take 5 mLs by mouth every 12 (twelve) hours as needed for cough. 05/06/18  Melynda Ripple, MD  esomeprazole (NEXIUM) 20 MG capsule Take 20 mg by mouth daily at 12 noon.    [provider]  fluticasone (FLONASE) 50 MCG/ACT nasal spray Place 2 sprays into both nostrils daily. 05/06/18   Melynda Ripple, MD  HYDROcodone-acetaminophen (NORCO/VICODIN) 5-325 MG tablet Take 2 tablets by mouth every 4 (four) hours as needed. 11/21/19   Faustino Congress, NP  HYDROcodone-homatropine [facility]) 5-1.5 MG/5ML syrup Take 5 mLs by mouth every 6 (six) hours as  needed for cough. 07/24/19   Darr, Marguerita Beards, PA-C  hydrocortisone (ANUSOL-HC) 2.5 % rectal cream Apply rectally 2 times daily 02/07/17   Robyn Haber, MD  ibuprofen (ADVIL,MOTRIN) 600 MG tablet Take 1 tablet (600 mg total) by mouth every 6 (six) hours as needed. 09/06/17   Moss, Amber, DO  insulin glargine (LANTUS) 100 UNIT/ML injection Inject 70 Units into the skin daily.     [provider]  JANUMET XR [phone] MG TB24 Take 1 tablet by mouth daily. 07/10/15   [provider]  LANTUS SOLOSTAR 100 UNIT/ML Solostar Pen INJECT 45 UNITS INTO THE SKIN 4 TIMES A DAY 08/09/17   [provider]  Liraglutide (VICTOZA) 18 MG/3ML SOPN Inject 1.8 mg into the skin daily.    [provider]  lisinopril-hydrochlorothiazide (PRINZIDE,ZESTORETIC) 20-12.5 MG tablet Take 1 tablet by mouth daily. for high blood pressure 07/02/15   [provider]  metFORMIN (GLUCOPHAGE) 1000 MG tablet Take 1,000 mg by mouth 2 (two) times daily with a meal.    [provider]  pravastatin (PRAVACHOL) 40 MG tablet Take 40 mg by mouth every evening. for cholesterol 05/30/15   [provider]  predniSONE (DELTASONE) 20 MG tablet Take 1 tablet (20 mg total) by mouth daily for 5 days. 11/23/19 11/28/19  Enrique Sack, FNP  promethazine-dextromethorphan (PROMETHAZINE-DM) 6.25-15 MG/5ML syrup Take 5 mLs by mouth every 4 (four) hours as needed for cough. 11/23/19   Enrique Sack, FNP  SUPREP BOWEL PREP KIT 17.5-3.13-1.6 GM/177ML SOLN See admin instructions. 08/12/17   [provider]  Vitamin D, Ergocalciferol, (DRISDOL) 50000 units CAPS capsule TAKE ONE CAPSULE BY MOUTH ONCE A WEEK FOR 12 WEEKS 08/09/17   [provider]    Family History Family History  Problem Relation Age of Onset   Diabetes Mother    Diabetes Father     Social History Social History   Tobacco Use   Smoking status: Current Some Day Smoker    Packs/day: 0.20    Types: Cigarettes    Smokeless tobacco: Never Used  Substance Use Topics   Alcohol use: Not Currently    Comment: none now, once per week or less   Drug use: No     Allergies   Patient has no known allergies.   Review of Systems Review of Systems  Constitutional: Positive for fatigue. Negative for fever.  HENT: Positive for congestion.   Respiratory: Positive for cough. Negative for shortness of breath and wheezing.   Gastrointestinal: Negative.   Musculoskeletal: Negative.   Neurological: Negative.   All other systems reviewed and are negative.    Physical Exam Triage Vital Signs ED Triage Vitals [11/23/19 1233]  Enc Vitals Group     BP 118/75     Pulse Rate 97     Resp 16     Temp 98.1 F (36.7 C)     Temp Source Oral     SpO2 100 %     Weight      Height  Head Circumference      Peak Flow      Pain Score      Pain Loc      Pain Edu?      Excl. in [place]?    No data found.  Updated Vital Signs BP 118/75 (BP Location: Left Arm)    Pulse 97    Temp 98.1 F (36.7 C) (Oral)    Resp 16    SpO2 100%   Visual Acuity Right Eye Distance:   Left Eye Distance:   Bilateral Distance:    Right Eye Near:   Left Eye Near:    Bilateral Near:     Physical Exam Vitals reviewed.  Constitutional:      General: He is not in acute distress.    Appearance: Normal appearance. He is not ill-appearing, toxic-appearing or diaphoretic.  HENT:     Head: Normocephalic.  Cardiovascular:     Rate and Rhythm: Normal rate and regular rhythm.     Heart sounds: Normal heart sounds.  Pulmonary:     Effort: Pulmonary effort is normal.     Breath sounds: Normal breath sounds.  Musculoskeletal:        General: Normal range of motion.     Cervical back: Normal range of motion and neck supple.  Lymphadenopathy:     Cervical: No cervical adenopathy.  Skin:    General: Skin is warm and dry.  Neurological:     General: No focal deficit present.     Mental Status: He is alert and oriented to person,  place, and time.  Psychiatric:        Mood and Affect: Mood normal.        Behavior: Behavior normal.      UC Treatments / Results  Labs (all labs ordered are listed, but only abnormal results are displayed) Labs Reviewed - No data to display  EKG   Radiology No results found.  Procedures Procedures (including critical care time)  Medications Ordered in UC Medications - No data to display  Initial Impression / Assessment and Plan / UC Course  I have reviewed the triage vital signs and the nursing notes.  Pertinent labs & imaging results that were available during my care of the patient were reviewed by me and considered in my medical decision making (see chart for details).    44 year old male with a prior history of COVID-19 presents with a 3-day history of worsening cough.  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  He is also a smoker.  He is afebrile.  Nontoxic-appearing.  Physical exam unremarkable.  Advised to continue prescribed antibiotics from the previous visit on 11/21/2019.  Will be given a short course of steroids.  Patient is diabetic and advised to monitor blood sugars closely.  Patient reports that his sugars usually run in 120s. Antitussives per medication orders. Avoid exposure to tobacco smoke and fumes. Follow-up as needed.   Today's evaluation has revealed no signs of a dangerous process. Discussed diagnosis with patient and/or guardian. Patient and/or guardian aware of their diagnosis, possible red flag symptoms to watch out for and need for close follow up. Patient and/or guardian understands verbal and written discharge instructions. Patient and/or guardian comfortable with plan and disposition.  Patient and/or guardian has a clear mental status at this time, good insight into illness (after discussion and teaching) and has clear judgment to make decisions regarding their care  This care was provided  during an  unprecedented National Emergency due to the Novel Coronavirus (COVID-19) pandemic. COVID-19 infections and transmission risks place heavy strains on healthcare resources.  As this pandemic evolves, our facility, providers, and staff strive to respond fluidly, to remain operational, and to provide care relative to available resources and information. Outcomes are unpredictable and treatments are without well-defined guidelines. Further, the impact of COVID-19 on all aspects of urgent care, including the impact to patients seeking care for reasons other than COVID-19, is unavoidable during this national emergency. At this time of the global pandemic, management of patients has significantly changed, even for non-COVID positive patients given high local and regional COVID volumes at this time requiring high healthcare system and resource utilization. The standard of care for management of both COVID suspected and non-COVID suspected patients continues to change rapidly at the local, regional, national, and global levels. This patient was worked up and treated to the best available but ever changing evidence and resources available at this current time.   Documentation was completed with the aid of voice recognition software. Transcription may contain typographical errors.  Final Clinical Impressions(s) / UC Diagnoses   Final diagnoses:  URI with cough and congestion     Discharge Instructions     Finish the antibiotics that was prescribed to you on 11/21/2019.  You have been given a short course of steroids as well as cough medicines for your symptoms as well.  Please monitor your sugars closely.  Drink plenty of fluids.  Follow-up with your primary care doctor as needed.    ED Prescriptions    Medication Sig Dispense Auth. Provider   promethazine-dextromethorphan (PROMETHAZINE-DM) 6.25-15 MG/5ML syrup Take 5 mLs by mouth every 4 (four) hours as needed for cough. 210 mL Enrique Sack, FNP    predniSONE (DELTASONE) 20 MG tablet Take 1 tablet (20 mg total) by mouth daily for 5 days. 5 tablet Enrique Sack, FNP     PDMP not reviewed this encounter.   Enrique Sack, [place] 11/23/19 1331

## 2019-11-23 NOTE — Discharge Instructions (Addendum)
Finish the antibiotics that was prescribed to you on 11/21/2019.  You have been given a short course of steroids as well as cough medicines for your symptoms as well.  Please monitor your sugars closely.  Drink plenty of fluids.  Follow-up with your primary care doctor as needed.

## 2020-02-15 ENCOUNTER — Ambulatory Visit: Payer: Self-pay

## 2020-02-15 ENCOUNTER — Encounter: Payer: Self-pay | Admitting: Orthopedic Surgery

## 2020-02-15 ENCOUNTER — Ambulatory Visit (INDEPENDENT_AMBULATORY_CARE_PROVIDER_SITE_OTHER): Payer: Medicaid Other | Admitting: Orthopedic Surgery

## 2020-02-15 VITALS — Ht 76.0 in | Wt 250.0 lb

## 2020-02-15 DIAGNOSIS — G8929 Other chronic pain: Secondary | ICD-10-CM

## 2020-02-15 DIAGNOSIS — M25521 Pain in right elbow: Secondary | ICD-10-CM | POA: Diagnosis not present

## 2020-02-15 DIAGNOSIS — M25511 Pain in right shoulder: Secondary | ICD-10-CM

## 2020-02-15 DIAGNOSIS — M25512 Pain in left shoulder: Secondary | ICD-10-CM | POA: Diagnosis not present

## 2020-02-19 ENCOUNTER — Encounter: Payer: Self-pay | Admitting: Orthopedic Surgery

## 2020-02-19 DIAGNOSIS — M25521 Pain in right elbow: Secondary | ICD-10-CM

## 2020-02-19 MED ORDER — METHYLPREDNISOLONE ACETATE 40 MG/ML IJ SUSP
40.0000 mg | INTRAMUSCULAR | Status: AC | PRN
  Administered 2020-02-19: 40 mg via INTRA_ARTICULAR

## 2020-02-19 MED ORDER — LIDOCAINE HCL 1 % IJ SOLN
2.0000 mL | INTRAMUSCULAR | Status: AC | PRN
  Administered 2020-02-19: 2 mL

## 2020-02-19 NOTE — Progress Notes (Signed)
Office Visit Note   Patient: Christopher Abbott           Date of Birth: 12-10-1975           MRN: [id number] Visit Date: 02/15/2020              Requested by: Ulanda Edison, MD [address] [place],  [place] [address] PCP: Ulanda Edison, MD  Chief Complaint  Patient presents with  . Right Shoulder - Pain  . Left Shoulder - Pain  . Right Elbow - Pain      HPI: Patient is a 44 year old gentleman who presents complaining of bilateral shoulder pain and right lateral elbow pain for several months.  He states the shoulder is not painful with range of motion but has difficulty laying on his arm to sleep.  He states he has elbow pain when trying to throw a football starting the lawnmower.  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  Assessment & Plan: Visit Diagnoses:  1. Chronic pain of both shoulders   2. Pain in right elbow     Plan: The lateral condyle was injected he tolerated this well patient was given instructions for internal and external rotation strengthening and scapular stabilization exercises for his shoulders.  Follow-Up Instructions: Return if symptoms worsen or fail to improve.   Ortho Exam  Patient is alert, oriented, no adenopathy, well-dressed, normal affect, normal respiratory effort. Examination of right upper extremity patient has pain to palpation primarily over the lateral epicondyle resisted extension of the wrist reproduces pain his right upper extremity is neurovascular intact.  Examination of his both shoulders he has full range of motion there is no pain with Neer or Hawkins impingement test.  The elbow has no redness no cellulitis no signs of inflammatory arthropathy.  Imaging: No results found. No images are attached to the encounter.  Labs: Lab Results  Component Value Date   HGBA1C 11.3 (H) 07/18/2015   ESRSEDRATE 30 (H) 09/01/2013   REPTSTATUS 08/31/2013 FINAL 08/27/2013   GRAMSTAIN  08/27/2013    NO WBC SEEN NO SQUAMOUS EPITHELIAL CELLS  SEEN NO ORGANISMS SEEN Performed at [facility]   CULT  08/27/2013    NO GROWTH 3 DAYS Performed at [facility]     No results found for: ALBUMIN, PREALBUMIN, LABURIC  No results found for: MG No results found for: VD25OH  No results found for: PREALBUMIN CBC EXTENDED Latest Ref Rng & Units 08/06/2019 07/18/2015 01/29/2014  WBC 4.0 - 10.5 K/uL 5.3 5.4 4.7  RBC 4.22 - 5.81 MIL/uL 5.24 5.26 4.80  HGB 13.0 - 17.0 g/dL 28.3 15.1 76.1  HCT 39 - 52 % 46.2 46.8 44.7  PLT 150 - 400 K/uL 458(H) 249 194  NEUTROABS 1.7 - 7.7 K/uL - - -  LYMPHSABS 0.7 - 4.0 K/uL - - -     Body mass index is 30.43 kg/m.  Orders:  Orders Placed This Encounter  Procedures  . XR Shoulder Right  . XR Shoulder Left  . XR Elbow 2 Views Right   No orders of the defined types were placed in this encounter.    Procedures: Medium Joint Inj: R lateral epicondyle on 02/19/2020 12:24 PM Indications: pain and diagnostic evaluation Details: 22 G 1.5 in needle, lateral approach Medications: 2 mL lidocaine 1 %; 40 mg methylPREDNISolone acetate 40 MG/ML Outcome: tolerated well, no immediate complications Procedure, treatment alternatives, risks and benefits explained, specific risks discussed. Consent was given by the patient. Immediately prior to  procedure a time out was called to verify the correct patient, procedure, equipment, support staff and site/side marked as required. Patient was prepped and draped in the usual sterile fashion.      Clinical Data: No additional findings.  ROS:  All other systems negative, except as noted in the HPI. Review of Systems  Objective: Vital Signs: Ht 6\' 4"  (1.93 m)   Wt 250 lb (113.4 kg)   BMI 30.43 kg/m   Specialty Comments:  No specialty comments available.  PMFS History: Patient Active Problem List   Diagnosis Date Noted  . Internal hemorrhoid, bleeding 08/29/2017  . Type 2 diabetes mellitus (HCC) 08/06/2017  . Achilles rupture, left  07/18/2015  . Rupture of left Achilles tendon 07/18/2015   Past Medical History:  Diagnosis Date  . Diabetes mellitus    type 2  . GERD (gastroesophageal reflux disease)   . Hypertension     Family History  Problem Relation Age of Onset  . Diabetes Mother   . Diabetes Father     Past Surgical History:  Procedure Laterality Date  . ACHILLES TENDON SURGERY Left 07/18/2015   Procedure: LEFT ACHILLES TENDON DIRECT PRIMARY REPAIR;  Surgeon: 07/20/2015, MD;  Location: [facility] OR;  Service: Orthopedics;  Laterality: Left;  . COLONOSCOPY WITH PROPOFOL N/A 08/14/2017   Procedure: COLONOSCOPY WITH PROPOFOL;  Surgeon: 08/16/2017, MD;  Location: [facility] ENDOSCOPY;  Service: Gastroenterology;  Laterality: N/A;  . NO PAST SURGERIES     Social History   Occupational History  . Not on file  Tobacco Use  . Smoking status: Current Some Day Smoker    Packs/day: 0.20    Types: Cigarettes  . Smokeless tobacco: Never Used  Substance and Sexual Activity  . Alcohol use: Not Currently    Comment: none now, once per week or less  . Drug use: No  . Sexual activity: Yes

## 2020-05-09 ENCOUNTER — Ambulatory Visit (HOSPITAL_COMMUNITY)
Admission: EM | Admit: 2020-05-09 | Discharge: 2020-05-09 | Disposition: A | Discharge status: Home / Self Care | Payer: Medicaid Other | Attending: Urgent Care | Admitting: Urgent Care

## 2020-05-09 ENCOUNTER — Encounter (HOSPITAL_COMMUNITY): Payer: Self-pay

## 2020-05-09 ENCOUNTER — Other Ambulatory Visit: Payer: Self-pay

## 2020-05-09 DIAGNOSIS — R059 Cough, unspecified: Secondary | ICD-10-CM

## 2020-05-09 DIAGNOSIS — J040 Acute laryngitis: Secondary | ICD-10-CM

## 2020-05-09 DIAGNOSIS — R490 Dysphonia: Secondary | ICD-10-CM

## 2020-05-09 LAB — CBG MONITORING, ED: Glucose-Capillary: 209 mg/dL — ABNORMAL HIGH (ref 70–99)

## 2020-05-09 MED ORDER — FLUCONAZOLE 200 MG PO TABS: 200.0000 mg | ORAL_TABLET | Freq: Every day | ORAL | 0 refills | Status: AC

## 2020-05-09 MED ORDER — PREDNISONE 10 MG PO TABS: 30.0000 mg | ORAL_TABLET | Freq: Every day | ORAL | 0 refills | Status: DC

## 2020-05-09 NOTE — ED Provider Notes (Signed)
[place]   MRN: [id number] DOB: [birth year]  Subjective:   Christopher Abbott is a 44 y.o. male presenting for 2-week history of persistent hoarseness, started having a productive cough this past week.  Denies fever, body aches, runny or stuffy nose, throat pain, chest pain, shortness of breath.  He is Covid vaccinated, had his booster shot recently as well.  Does not want to be Covid tested.  Patient is a type II diabetic on insulin.  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  Regarding his blood sugars patient states that the blood sugar checks never go above 230.  Patient is a smoker, has about 1 to 2 cigars/day.  No alcohol use.  No current facility-administered medications for this encounter.  Current Outpatient Medications:    ACCU-CHEK FASTCLIX LANCETS MISC, USE AS DIRECTED TO CHECK BLOOD SUAGR DX: E11.9, Disp: , Rfl: 11   ACCU-CHEK GUIDE test strip, USE AS DIRECTED TO TEST BLOOD SUGAR TWICE A DAY DX:E11.9, Disp: , Rfl: 11   azithromycin (ZITHROMAX) 250 MG tablet, Take 2 tabs PO x 1 dose, then 1 tab PO QD x 4 days, Disp: 6 tablet, Rfl: 0   B-D ULTRAFINE III SHORT PEN 31G X 8 MM MISC, USE WITH INSULIN PEN INJECTIONS DAILY ( DX: E11.9), Disp: , Rfl: 11   benzonatate (TESSALON) 100 MG capsule, Take 1 capsule (100 mg total) by mouth every 8 (eight) hours., Disp: 21 capsule, Rfl: 0   Blood Glucose Monitoring Suppl (ACCU-CHEK GUIDE) w/Device KIT, USE AS DIRECTED DX E11.9 (MEDICAID PREFERRED), Disp: , Rfl: 0   chlorhexidine (PERIDEX) 0.12 % solution, RINSE WITH 1/2 CAPFUL AND SWISH FOR 30 SECONDS, THEN SPIT. USE TWICE A DAY, Disp: , Rfl: 0   chlorpheniramine-HYDROcodone (TUSSIONEX PENNKINETIC ER) 10-8 MG/5ML SUER, Take 5 mLs by mouth every 12 (twelve) hours as needed for cough., Disp: 60 mL, Rfl: 0   esomeprazole (NEXIUM)  20 MG capsule, Take 20 mg by mouth daily at 12 noon., Disp: , Rfl:    fluticasone (FLONASE) 50 MCG/ACT nasal spray, Place 2 sprays into both nostrils daily., Disp: 16 g, Rfl: 0   HYDROcodone-acetaminophen (NORCO/VICODIN) 5-325 MG tablet, Take 2 tablets by mouth every 4 (four) hours as needed., Disp: 10 tablet, Rfl: 0   HYDROcodone-homatropine (HYCODAN) 5-1.5 MG/5ML syrup, Take 5 mLs by mouth every 6 (six) hours as needed for cough., Disp: 120 mL, Rfl: 0   hydrocortisone (ANUSOL-HC) 2.5 % rectal cream, Apply rectally 2 times daily, Disp: 28 g, Rfl: 2   ibuprofen (ADVIL,MOTRIN) 600 MG tablet, Take 1 tablet (600 mg total) by mouth every 6 (six) hours as needed., Disp: 30 tablet, Rfl: 0   insulin glargine (LANTUS) 100 UNIT/ML injection, Inject 70 Units into the skin daily. , Disp: , Rfl:    JANUMET XR [phone] MG TB24, Take 1 tablet by mouth daily., Disp: , Rfl: 3   LANTUS SOLOSTAR 100 UNIT/ML Solostar Pen, INJECT 45 UNITS INTO THE SKIN 4 TIMES A DAY, Disp: , Rfl: 3   Liraglutide (VICTOZA) 18 MG/3ML SOPN, Inject 1.8 mg into the skin daily., Disp: , Rfl:    lisinopril-hydrochlorothiazide (PRINZIDE,ZESTORETIC) 20-12.5 MG tablet, Take 1 tablet by mouth daily. for high blood pressure, Disp: , Rfl: 5   metFORMIN (GLUCOPHAGE) 1000 MG tablet, Take 1,000 mg  by mouth 2 (two) times daily with a meal., Disp: , Rfl:    pravastatin (PRAVACHOL) 40 MG tablet, Take 40 mg by mouth every evening. for cholesterol, Disp: , Rfl: 1   promethazine-dextromethorphan (PROMETHAZINE-DM) 6.25-15 MG/5ML syrup, Take 5 mLs by mouth every 4 (four) hours as needed for cough., Disp: 210 mL, Rfl: 0   SUPREP BOWEL PREP KIT 17.5-3.13-1.6 GM/177ML SOLN, See admin instructions., Disp: , Rfl: 0   Vitamin D, Ergocalciferol, (DRISDOL) 50000 units CAPS capsule, TAKE ONE CAPSULE BY MOUTH ONCE A WEEK FOR 12 WEEKS, Disp: , Rfl: 3   No Known Allergies  Past Medical History:  Diagnosis Date   Diabetes mellitus    type 2   GERD  (gastroesophageal reflux disease)    Hypertension      Past Surgical History:  Procedure Laterality Date   ACHILLES TENDON SURGERY Left 07/18/2015   Procedure: LEFT ACHILLES TENDON DIRECT PRIMARY REPAIR;  Surgeon: Mcarthur Rossetti, MD;  Location: [place];  Service: Orthopedics;  Laterality: Left;   COLONOSCOPY WITH PROPOFOL N/A 08/14/2017   Procedure: COLONOSCOPY WITH PROPOFOL;  Surgeon: Lin Landsman, MD;  Location: [facility] ENDOSCOPY;  Service: Gastroenterology;  Laterality: N/A;   NO PAST SURGERIES      Family History  Problem Relation Age of Onset   Diabetes Mother    Diabetes Father     Social History   Tobacco Use   Smoking status: Current Some Day Smoker    Packs/day: 0.20    Types: Cigarettes   Smokeless tobacco: Never Used  Substance Use Topics   Alcohol use: Not Currently    Comment: none now, once per week or less   Drug use: No    ROS   Objective:   Vitals: BP 126/88 (BP Location: Left Arm)    Pulse 84    Temp 97.9 F (36.6 C) (Oral)    Resp 20    SpO2 97%   Physical Exam Constitutional:      General: He is not in acute distress.    Appearance: Normal appearance. He is well-developed. He is not ill-appearing, toxic-appearing or diaphoretic.  HENT:     Head: Normocephalic and atraumatic.     Right Ear: External ear normal.     Left Ear: External ear normal.     Nose: Nose normal.     Mouth/Throat:     Mouth: Mucous membranes are moist.     Pharynx: No oropharyngeal exudate or posterior oropharyngeal erythema.     Comments: Geographic tongue. Eyes:     General: No scleral icterus.       Right eye: No discharge.        Left eye: No discharge.     Extraocular Movements: Extraocular movements intact.     Conjunctiva/sclera: Conjunctivae normal.     Pupils: Pupils are equal, round, and reactive to light.  Cardiovascular:     Rate and Rhythm: Normal rate and regular rhythm.     Heart sounds: Normal heart sounds. No murmur heard. No  friction rub. No gallop.   Pulmonary:     Effort: Pulmonary effort is normal. No respiratory distress.     Breath sounds: Normal breath sounds. No stridor. No wheezing, rhonchi or rales.  Neurological:     Mental Status: He is alert and oriented to person, place, and time.  Psychiatric:        Mood and Affect: Mood normal.        Behavior: Behavior normal.  Thought Content: Thought content normal.        Judgment: Judgment normal.     Results for orders placed or performed during the hospital encounter of 05/09/20 (from the past 24 hour(s))  POC CBG monitoring     Status: Abnormal   Collection Time: 05/09/20 12:31 PM  Result Value Ref Range   Glucose-Capillary 209 (H) 70 - 99 mg/dL    Assessment and Plan :   PDMP not reviewed this encounter.  1. Laryngitis   2. Hoarseness of voice   3. Cough     Patient did not take his insulin this morning.  Insists that he has much better diabetic control.  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  Recommended voice rest and a low-dose prednisone course.  Patient also has oral thrush which could contribute to his symptoms set, will have him use Diflucan 200 mg for 7 days. Counseled patient on potential for adverse effects with medications prescribed/recommended today, ER and return-to-clinic precautions discussed, patient verbalized understanding.    Jaynee Eagles, PA-C 05/09/20 1309

## 2020-05-09 NOTE — Discharge Instructions (Signed)
We will be using an oral prednisone course to help with your laryngitis. I want to emphasize continued voice rest. Also diflucan can help with oral thrush as a source of your symptoms. Please make sure that you are eating diabetic friendly foods and avoiding high carbs in your diet.

## 2020-05-09 NOTE — ED Triage Notes (Signed)
Pt in with c/o voice hoarseness that has been going on for 2 weeks. Also c/o productive cough.

## 2020-09-08 ENCOUNTER — Other Ambulatory Visit: Payer: Self-pay

## 2020-09-08 ENCOUNTER — Ambulatory Visit (HOSPITAL_COMMUNITY)
Admission: EM | Admit: 2020-09-08 | Discharge: 2020-09-08 | Disposition: A | Discharge status: Home / Self Care | Payer: Medicaid Other | Attending: Emergency Medicine | Admitting: Emergency Medicine

## 2020-09-08 ENCOUNTER — Encounter (HOSPITAL_COMMUNITY): Payer: Self-pay | Admitting: Emergency Medicine

## 2020-09-08 DIAGNOSIS — J302 Other seasonal allergic rhinitis: Secondary | ICD-10-CM

## 2020-09-08 DIAGNOSIS — R059 Cough, unspecified: Secondary | ICD-10-CM

## 2020-09-08 MED ORDER — PREDNISONE 20 MG PO TABS: 40.0000 mg | ORAL_TABLET | Freq: Every day | ORAL | 0 refills | Status: AC

## 2020-09-08 MED ORDER — PROMETHAZINE-DM 6.25-15 MG/5ML PO SYRP: 5.0000 mL | ORAL_SOLUTION | Freq: Four times a day (QID) | ORAL | 0 refills | Status: DC | PRN

## 2020-09-08 MED ORDER — BENZONATATE 100 MG PO CAPS: 200.0000 mg | ORAL_CAPSULE | Freq: Three times a day (TID) | ORAL | 0 refills | Status: DC | PRN

## 2020-09-08 NOTE — ED Triage Notes (Signed)
Pt is present today with a cough. Pt states that his cough started two days ago.

## 2020-09-08 NOTE — Discharge Instructions (Addendum)
Continue to take the Claritin and Flonase daily.  Take prednisone to tablets a day for the next 5 days.  You can take the Tessalon Perle 1 to 2 capsules as needed for cough. Take the Promethazine DM at night for cough relief.  Continue to drink plenty of fluids and rest.  Follow-up with primary care or go to the Emergency Department if symptoms worsen or do not improve in the next few days.

## 2020-09-08 NOTE — ED Provider Notes (Signed)
[place]    CSN: [id number] Arrival date & time: 09/08/20  [place]      History   Chief Complaint Chief Complaint  Patient presents with  . Cough    HPI Christopher Abbott is a 45 y.o. male.   Patient here for evaluation of cough and seasonal allergies.  Reports developed similar symptoms every year around this time.  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  Reports taking Claritin and Flonase daily for seasonal allergy relief.  Reports getting some relief with that but cough worsening over the past 2 days.  Reports taking Tessalon Perles, prednisone, and cough syrup for relief in the past.  Requesting refill on those medications at this time.  Denies any fevers, chest pain, shortness of breath, N/V/D, numbness, tingling, weakness, abdominal pain, or headaches.   ROS: As per HPI, all other pertinent ROS negative   The history is provided by the patient.  Cough   Past Medical History:  Diagnosis Date  . Diabetes mellitus    type 2  . GERD (gastroesophageal reflux disease)   . Hypertension     Patient Active Problem List   Diagnosis Date Noted  . Internal hemorrhoid, bleeding 08/29/2017  . Type 2 diabetes mellitus ([place]) 08/06/2017  . 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 Rupture of left Achilles tendon 07/18/2015    Past Surgical History:  Procedure Laterality Date  . ACHILLES TENDON SURGERY Left 07/18/2015   Procedure: LEFT ACHILLES TENDON DIRECT PRIMARY REPAIR;  Surgeon: Mcarthur Rossetti, MD;  Location: [place];  Service: Orthopedics;  Laterality: Left;  . COLONOSCOPY WITH PROPOFOL N/A 08/14/2017   Procedure: COLONOSCOPY WITH PROPOFOL;  Surgeon: Lin Landsman, MD;  Location: [facility] ENDOSCOPY;  Service: Gastroenterology;  Laterality: N/A;  . NO PAST SURGERIES         Home Medications    Prior to Admission medications   Medication Sig Start Date End Date Taking? Authorizing Provider  benzonatate (TESSALON PERLES) 100 MG capsule Take 2 capsules  (200 mg total) by mouth 3 (three) times daily as needed for cough. 09/08/20  Yes Pearson Forster, NP  predniSONE (DELTASONE) 20 MG tablet Take 2 tablets (40 mg total) by mouth daily for 5 days. 09/08/20 09/13/20 Yes Pearson Forster, NP  promethazine-dextromethorphan (PROMETHAZINE-DM) 6.25-15 MG/5ML syrup Take 5 mLs by mouth 4 (four) times daily as needed for cough. 09/08/20  Yes Pearson Forster, NP  ACCU-CHEK FASTCLIX LANCETS MISC USE AS DIRECTED TO CHECK BLOOD SUAGR DX: E11.9 08/28/17   [provider]  ACCU-CHEK GUIDE test strip USE AS DIRECTED TO TEST BLOOD SUGAR TWICE A DAY DX:E11.9 08/29/17   [provider]  azithromycin (ZITHROMAX) 250 MG tablet Take 2 tabs PO x 1 dose, then 1 tab PO QD x 4 days 08/01/19   Scot Jun, FNP  B-D ULTRAFINE III SHORT PEN 31G X 8 MM MISC USE WITH INSULIN PEN INJECTIONS DAILY ( DX: E11.9) 09/17/17   [provider]  Blood Glucose Monitoring Suppl (ACCU-CHEK GUIDE) w/Device KIT USE AS DIRECTED DX E11.9 (MEDICAID PREFERRED) 08/28/17   [provider]  chlorhexidine (PERIDEX) 0.12 % solution RINSE WITH 1/2 CAPFUL AND SWISH FOR 30 SECONDS, THEN SPIT. USE TWICE A DAY 09/10/17   [provider]  chlorpheniramine-HYDROcodone (TUSSIONEX PENNKINETIC ER) 10-8 MG/5ML SUER Take 5 mLs by mouth every 12 (twelve) hours as needed for cough. 05/06/18   Melynda Ripple, MD  esomeprazole (NEXIUM) 20 MG capsule Take 20 mg by mouth daily at  12 noon.    [provider]  fluticasone (FLONASE) 50 MCG/ACT nasal spray Place 2 sprays into both nostrils daily. 05/06/18   Melynda Ripple, MD  HYDROcodone-acetaminophen (NORCO/VICODIN) 5-325 MG tablet Take 2 tablets by mouth every 4 (four) hours as needed. 11/21/19   Faustino Congress, NP  HYDROcodone-homatropine [facility]) 5-1.5 MG/5ML syrup Take 5 mLs by mouth every 6 (six) hours as needed for cough. 07/24/19   Darr, Edison Nasuti, PA-C  hydrocortisone (ANUSOL-HC) 2.5 % rectal cream Apply rectally 2  times daily 02/07/17   Robyn Haber, MD  ibuprofen (ADVIL,MOTRIN) 600 MG tablet Take 1 tablet (600 mg total) by mouth every 6 (six) hours as needed. 09/06/17   Moss, Amber, DO  insulin glargine (LANTUS) 100 UNIT/ML injection Inject 70 Units into the skin daily.     [provider]  JANUMET XR [id number] MG TB24 Take 1 tablet by mouth daily. 07/10/15   [provider]  LANTUS SOLOSTAR 100 UNIT/ML Solostar Pen INJECT 45 UNITS INTO THE SKIN 4 TIMES A DAY 08/09/17   [provider]  Liraglutide (VICTOZA) 18 MG/3ML SOPN Inject 1.8 mg into the skin daily.    [provider]  lisinopril-hydrochlorothiazide (PRINZIDE,ZESTORETIC) 20-12.5 MG tablet Take 1 tablet by mouth daily. for high blood pressure 07/02/15   [provider]  metFORMIN (GLUCOPHAGE) 1000 MG tablet Take 1,000 mg by mouth 2 (two) times daily with a meal.    [provider]  pravastatin (PRAVACHOL) 40 MG tablet Take 40 mg by mouth every evening. for cholesterol 05/30/15   [provider]  SUPREP BOWEL PREP KIT 17.5-3.13-1.6 GM/177ML SOLN See admin instructions. 08/12/17   [provider]  Vitamin D, Ergocalciferol, (DRISDOL) 50000 units CAPS capsule TAKE ONE CAPSULE BY MOUTH ONCE A WEEK FOR 12 WEEKS 08/09/17   [provider]    Family History Family History  Problem Relation Age of Onset  . Diabetes Mother   . Diabetes Father     Social History Social History   Tobacco Use  . Smoking status: Current Some Day Smoker    Packs/day: 0.20    Types: Cigarettes  . Smokeless tobacco: Never Used  Substance Use Topics  . Alcohol use: Not Currently    Comment: none now, once per week or less  . Drug use: No     Allergies   Patient has no known allergies.   Review of Systems Review of Systems  HENT: Positive for congestion.   Respiratory: Positive for cough.   All other systems reviewed and are negative.    Physical Exam Triage Vital Signs ED Triage  Vitals [09/08/20 1901]  Enc Vitals Group     BP (!) 143/79     Pulse Rate (!) 106     Resp 18     Temp 99.5 F (37.5 C)     Temp Source Oral     SpO2 97 %     Weight      Height      Head Circumference      Peak Flow      Pain Score 10     Pain Loc      Pain Edu?      Excl. in [place]?    No data found.  Updated Vital Signs BP (!) 143/79 (BP Location: Left Arm)   Pulse (!) 106   Temp 99.5 F (37.5 C) (Oral)   Resp 18   SpO2 97%   Visual Acuity Right Eye Distance:   Left  Eye Distance:   Bilateral Distance:    Right Eye Near:   Left Eye Near:    Bilateral Near:     Physical Exam Vitals and nursing note reviewed.  Constitutional:      General: He is not in acute distress.    Appearance: Normal appearance. He is not ill-appearing, toxic-appearing or diaphoretic.  HENT:     Head: Normocephalic and atraumatic.     Nose: Congestion present.  Eyes:     Conjunctiva/sclera: Conjunctivae normal.  Cardiovascular:     Rate and Rhythm: Normal rate.     Pulses: Normal pulses.     Heart sounds: Normal heart sounds.  Pulmonary:     Effort: Pulmonary effort is normal.     Breath sounds: Normal breath sounds.  Abdominal:     General: Abdomen is flat.  Musculoskeletal:        General: Normal range of motion.     Cervical back: Normal range of motion.  Skin:    General: Skin is warm and dry.  Neurological:     General: No focal deficit present.     Mental Status: He is alert and oriented to person, place, and time.  Psychiatric:        Mood and Affect: Mood normal.      UC Treatments / Results  Labs (all labs ordered are listed, but only abnormal results are displayed) Labs Reviewed - No data to display  EKG   Radiology No results found.  Procedures Procedures (including critical care time)  Medications Ordered in UC Medications - No data to display  Initial Impression / Assessment and Plan / UC Course  I have reviewed the triage vital signs and the  nursing notes.  Pertinent labs & imaging results that were available during my care of the patient were reviewed by me and considered in my medical decision making (see chart for details).     Assessment negative for red flags or concerns.  Refills given for prednisone x5 days, Tessalon Perles, and promethazine DM.  Continue to take Claritin and Flonase daily.  Recommend fluids and rest.  Follow-up if symptoms do not improve or worsen over the next few days.  Final Clinical Impressions(s) / UC Diagnoses   Final diagnoses:  Cough  Seasonal allergies     Discharge Instructions     Continue to take the Claritin and Flonase daily.  Take prednisone to tablets a day for the next 5 days.  You can take the Tessalon Perle 1 to 2 capsules as needed for cough. Take the Promethazine DM at night for cough relief.  Continue to drink plenty of fluids and rest.  Follow-up with primary care or go to the Emergency Department if symptoms worsen or do not improve in the next few days.      ED Prescriptions    Medication Sig Dispense Auth. Provider   benzonatate (TESSALON PERLES) 100 MG capsule Take 2 capsules (200 mg total) by mouth 3 (three) times daily as needed for cough. 20 capsule Pearson Forster, NP   promethazine-dextromethorphan (PROMETHAZINE-DM) 6.25-15 MG/5ML syrup Take 5 mLs by mouth 4 (four) times daily as needed for cough. 118 mL Pearson Forster, NP   predniSONE (DELTASONE) 20 MG tablet Take 2 tablets (40 mg total) by mouth daily for 5 days. 10 tablet Pearson Forster, NP     PDMP not reviewed this encounter.   Pearson Forster, NP 09/08/20 [phone]

## 2020-11-02 ENCOUNTER — Ambulatory Visit (HOSPITAL_COMMUNITY)
Admission: EM | Admit: 2020-11-02 | Discharge: 2020-11-02 | Disposition: A | Discharge status: Home / Self Care | Payer: Medicaid Other | Attending: Physician Assistant | Admitting: Physician Assistant

## 2020-11-02 ENCOUNTER — Other Ambulatory Visit: Payer: Self-pay

## 2020-11-02 ENCOUNTER — Encounter (HOSPITAL_COMMUNITY): Payer: Self-pay

## 2020-11-02 DIAGNOSIS — R0981 Nasal congestion: Secondary | ICD-10-CM

## 2020-11-02 DIAGNOSIS — J069 Acute upper respiratory infection, unspecified: Secondary | ICD-10-CM

## 2020-11-02 DIAGNOSIS — R059 Cough, unspecified: Secondary | ICD-10-CM

## 2020-11-02 MED ORDER — BENZONATATE 100 MG PO CAPS: 200.0000 mg | ORAL_CAPSULE | Freq: Three times a day (TID) | ORAL | 0 refills | Status: DC | PRN

## 2020-11-02 MED ORDER — PREDNISONE 10 MG PO TABS: 20.0000 mg | ORAL_TABLET | Freq: Every day | ORAL | 0 refills | Status: AC

## 2020-11-02 MED ORDER — PROMETHAZINE-DM 6.25-15 MG/5ML PO SYRP: 5.0000 mL | ORAL_SOLUTION | Freq: Four times a day (QID) | ORAL | 0 refills | Status: DC | PRN

## 2020-11-02 NOTE — ED Triage Notes (Signed)
Pt presents with a itchy throat and cough X 3 days. Pt states he has been feeling congested.

## 2020-11-02 NOTE — Discharge Instructions (Addendum)
Take Promethazine DM (cough syrup) up to 4 times a day.  This will make you sleepy so do not drive or drink alcohol with it.  You can also use Tessalon up to 3 times a day as needed for cough.  Take prednisone 20 mg in the morning.  This can raise your blood sugar so please avoid carbohydrates and drink plenty of fluid.  If your blood sugars are consistently above 200 you need to contact your primary care for medication adjustment or come back to see [place].  Do not take NSAIDs with prednisone as it can cause stomach bleeding.  This includes aspirin, ibuprofen/Advil, naproxen/Aleve.  Continue with your allergy medicine as prescribed.  If anything worsens return for reevaluation.

## 2020-11-02 NOTE — ED Provider Notes (Signed)
[facility]    CSN: [id number] Arrival date & time: 11/02/20  1205      History   Chief Complaint Chief Complaint  Patient presents with   Cough   Sore Throat    HPI Christopher Abbott is a 44 y.o. male.   Patient presents today with a several day history of cough and nasal congestion.  He reports associated headache as well as sore throat.  Denies any chest pain, shortness of breath, body aches, fever, nausea, vomiting.  He has been taking Flonase and Claritin without improvement of symptoms.  Reports he generally gets the symptoms a few times per year and requires cough medication and prednisone for resolution of symptoms.  He denies history of asthma or smoking.  He does have a history of diabetes but reports he has taken low-dose prednisone in the past without significant hyperglycemia.  He denies any known sick contacts.  He has no concern for COVID and declines testing for flu or COVID today.  Denies any recent antibiotic use.   Past Medical History:  Diagnosis Date   Diabetes mellitus    type 2   GERD (gastroesophageal reflux disease)    Hypertension     Patient Active Problem List   Diagnosis Date Noted   Internal hemorrhoid, bleeding 08/29/2017   Type 2 diabetes mellitus (HCC) 08/06/2017   Achilles rupture, left 07/18/2015   Rupture of left Achilles tendon 07/18/2015    Past Surgical History:  Procedure Laterality Date   ACHILLES TENDON SURGERY Left 07/18/2015   Procedure: LEFT ACHILLES TENDON DIRECT PRIMARY REPAIR;  Surgeon: Christopher Y Blackman, MD;  Location: [facility];  Service: Orthopedics;  Laterality: Left;   COLONOSCOPY WITH PROPOFOL N/A 08/14/2017   Procedure: COLONOSCOPY WITH PROPOFOL;  Surgeon: Vanga, [name], MD;  Location: [facility];  Service: Gastroenterology;  Laterality: N/A;   NO PAST SURGERIES         Home Medications    Prior to Admission medications   Medication Sig Start Date End Date Taking? Authorizing Provider   predniSONE (DELTASONE) 10 MG tablet Take 2 tablets (20 mg total) by mouth daily for 4 days. 11/02/20 11/06/20 Yes Alixander Rallis K, PA-C  ACCU-CHEK FASTCLIX LANCETS MISC USE AS DIRECTED TO CHECK BLOOD SUAGR DX: E11.9 08/28/17   [provider]  ACCU-CHEK GUIDE test strip USE AS DIRECTED TO TEST BLOOD SUGAR TWICE A DAY DX:E11.9 08/29/17   [provider]  azithromycin (ZITHROMAX) 250 MG tablet Take 2 tabs PO x 1 dose, then 1 tab PO QD x 4 days 08/01/19   Harris, Kimberly S, FNP  B-D ULTRAFINE III SHORT PEN 31G X 8 MM MISC USE WITH INSULIN PEN INJECTIONS DAILY ( DX: E11.9) 09/17/17   [provider]  benzonatate (TESSALON PERLES) 100 MG capsule Take 2 capsules (200 mg total) by mouth 3 (three) times daily as needed for cough. 11/02/20   Elanda Garmany K, PA-C  Blood Glucose Monitoring Suppl (ACCU-CHEK GUIDE) w/Device KIT USE AS DIRECTED DX E11.9 (MEDICAID PREFERRED) 08/28/17   [provider]  esomeprazole (NEXIUM) 20 MG capsule Take 20 mg by mouth daily at 12 noon.    [provider]  fluticasone (FLONASE) 50 MCG/ACT nasal spray Place 2 sprays into both nostrils daily. 05/06/18   Mortenson, Ashley, MD  HYDROcodone-acetaminophen (NORCO/VICODIN) 5-325 MG tablet Take 2 tablets by mouth every 4 (four) hours as needed. 11/21/19   Matthews, Stephanie, NP  hydrocortisone (ANUSOL-HC) 2.5 % rectal cream Apply rectally 2 times daily   02/07/17   Lauenstein, Kurt, MD  ibuprofen (ADVIL,MOTRIN) 600 MG tablet Take 1 tablet (600 mg total) by mouth every 6 (six) hours as needed. 09/06/17   Moss, Amber, DO  insulin glargine (LANTUS) 100 UNIT/ML injection Inject 70 Units into the skin daily.     [provider]  JANUMET XR 100-1000 MG TB24 Take 1 tablet by mouth daily. 07/10/15   [provider]  LANTUS SOLOSTAR 100 UNIT/ML Solostar Pen INJECT 45 UNITS INTO THE SKIN 4 TIMES A DAY 08/09/17   [provider]  Liraglutide (VICTOZA) 18 MG/3ML SOPN Inject 1.8 mg into the skin  daily.    [provider]  lisinopril-hydrochlorothiazide (PRINZIDE,ZESTORETIC) 20-12.5 MG tablet Take 1 tablet by mouth daily. for high blood pressure 07/02/15   [provider]  metFORMIN (GLUCOPHAGE) 1000 MG tablet Take 1,000 mg by mouth 2 (two) times daily with a meal.    [provider]  pravastatin (PRAVACHOL) 40 MG tablet Take 40 mg by mouth every evening. for cholesterol 05/30/15   [provider]  promethazine-dextromethorphan (PROMETHAZINE-DM) 6.25-15 MG/5ML syrup Take 5 mLs by mouth 4 (four) times daily as needed for cough. 11/02/20   Raspet, Erin K, PA-C  SUPREP BOWEL PREP KIT 17.5-3.13-1.6 GM/177ML SOLN See admin instructions. 08/12/17   [provider]  Vitamin D, Ergocalciferol, (DRISDOL) 50000 units CAPS capsule TAKE ONE CAPSULE BY MOUTH ONCE A WEEK FOR 12 WEEKS 08/09/17   [provider]    Family History Family History  Problem Relation Age of Onset   Diabetes Mother    Diabetes Father     Social History Social History   Tobacco Use   Smoking status: Some Days    Packs/day: 0.20    Pack years: 0.00    Types: Cigarettes   Smokeless tobacco: Never  Substance Use Topics   Alcohol use: Not Currently    Comment: none now, once per week or less   Drug use: No     Allergies   Patient has no known allergies.   Review of Systems Review of Systems  Constitutional:  Negative for activity change, appetite change, fatigue and fever.  HENT:  Positive for congestion and sore throat. Negative for sinus pressure and sneezing.   Respiratory:  Positive for cough. Negative for shortness of breath.   Cardiovascular:  Negative for chest pain.  Gastrointestinal:  Negative for abdominal pain, diarrhea, nausea and vomiting.  Musculoskeletal:  Negative for arthralgias and myalgias.  Neurological:  Positive for headaches. Negative for dizziness and light-headedness.    Physical Exam Triage Vital Signs ED Triage Vitals  Enc Vitals  Group     BP 11/02/20 1334 137/89     Pulse Rate 11/02/20 1334 94     Resp 11/02/20 1334 19     Temp 11/02/20 1334 99.1 F (37.3 C)     Temp Source 11/02/20 1334 Oral     SpO2 11/02/20 1334 97 %     Weight --      Height --      Head Circumference --      Peak Flow --      Pain Score 11/02/20 1332 0     Pain Loc --      Pain Edu? --      Excl. in [place]? --    No data found.  Updated Vital Signs BP 137/89 (BP Location: Left Arm)   Pulse 94   Temp 99.1 F (37.3 C) (Oral)   Resp 19     SpO2 97%   Visual Acuity Right Eye Distance:   Left Eye Distance:   Bilateral Distance:    Right Eye Near:   Left Eye Near:    Bilateral Near:     Physical Exam Vitals reviewed.  Constitutional:      General: He is awake.     Appearance: Normal appearance. He is normal weight. He is not ill-appearing.     Comments: Very pleasant male appears stated age in no acute distress  HENT:     Head: Normocephalic and atraumatic.     Right Ear: Tympanic membrane, ear canal and external ear normal. Tympanic membrane is not erythematous or bulging.     Left Ear: Tympanic membrane, ear canal and external ear normal. Tympanic membrane is not erythematous or bulging.     Nose: Nose normal.     Right Sinus: No maxillary sinus tenderness or frontal sinus tenderness.     Left Sinus: No maxillary sinus tenderness or frontal sinus tenderness.     Mouth/Throat:     Pharynx: Uvula midline. Posterior oropharyngeal erythema present. No oropharyngeal exudate.     Comments: Drainage present posterior oropharynx Cardiovascular:     Rate and Rhythm: Normal rate and regular rhythm.     Heart sounds: Normal heart sounds, S1 normal and S2 normal. No murmur heard. Pulmonary:     Effort: Pulmonary effort is normal. No accessory muscle usage or respiratory distress.     Breath sounds: Normal breath sounds. No stridor. No wheezing, rhonchi or rales.     Comments: Clear to auscultation bilaterally Abdominal:      General: Bowel sounds are normal.     Palpations: Abdomen is soft.     Tenderness: There is no abdominal tenderness.  Musculoskeletal:     Cervical back: Normal range of motion and neck supple.  Lymphadenopathy:     Head:     Right side of head: No submental, submandibular or tonsillar adenopathy.     Left side of head: No submental, submandibular or tonsillar adenopathy.     Cervical: No cervical adenopathy.  Neurological:     Mental Status: He is alert.  Psychiatric:        Behavior: Behavior is cooperative.     UC Treatments / Results  Labs (all labs ordered are listed, but only abnormal results are displayed) Labs Reviewed - No data to display  EKG   Radiology No results found.  Procedures Procedures (including critical care time)  Medications Ordered in UC Medications - No data to display  Initial Impression / Assessment and Plan / UC Course  I have reviewed the triage vital signs and the nursing notes.  Pertinent labs & imaging results that were available during my care of the patient were reviewed by me and considered in my medical decision making (see chart for details).    No indication for antibiotics given short duration of symptoms and normal physical exam.  Patient declined flu and COVID-19 testing as he states this is his typical illness and has not had any known sick contacts.  He was prescribed 20 mg of prednisone daily for 4 days with instruction that this can cause hyperglycemia.  Recommended he avoid carbohydrates and drink plenty of fluid.  He was instructed to closely monitor his blood sugar and contact us or his PCP if this remains elevated.  He was given Promethazine DM with instruction not to drive or drink alcohol with this.  He was given Tessalon for diurnal cough symptoms.    Recommended he use over-the-counter medications for symptom management.  Discussed alarm symptoms that warrant emergent evaluation.  Strict return precautions given to which  patient expressed understanding.  Final Clinical Impressions(s) / UC Diagnoses   Final diagnoses:  Viral URI with cough  Cough  Nasal congestion     Discharge Instructions      Take Promethazine DM (cough syrup) up to 4 times a day.  This will make you sleepy so do not drive or drink alcohol with it.  You can also use Tessalon up to 3 times a day as needed for cough.  Take prednisone 20 mg in the morning.  This can raise your blood sugar so please avoid carbohydrates and drink plenty of fluid.  If your blood sugars are consistently above 200 you need to contact your primary care for medication adjustment or come back to see us.  Do not take NSAIDs with prednisone as it can cause stomach bleeding.  This includes aspirin, ibuprofen/Advil, naproxen/Aleve.  Continue with your allergy medicine as prescribed.  If anything worsens return for reevaluation.     ED Prescriptions     Medication Sig Dispense Auth. Provider   benzonatate (TESSALON PERLES) 100 MG capsule Take 2 capsules (200 mg total) by mouth 3 (three) times daily as needed for cough. 30 capsule Raspet, Erin K, PA-C   promethazine-dextromethorphan (PROMETHAZINE-DM) 6.25-15 MG/5ML syrup Take 5 mLs by mouth 4 (four) times daily as needed for cough. 240 mL Raspet, Erin K, PA-C   predniSONE (DELTASONE) 10 MG tablet Take 2 tablets (20 mg total) by mouth daily for 4 days. 8 tablet Raspet, Erin K, PA-C      PDMP not reviewed this encounter.   Raspet, Erin K, PA-C 11/02/20 1405  

## 2021-02-22 ENCOUNTER — Other Ambulatory Visit: Payer: Self-pay

## 2021-02-22 ENCOUNTER — Ambulatory Visit (HOSPITAL_COMMUNITY)
Admission: EM | Admit: 2021-02-22 | Discharge: 2021-02-22 | Disposition: A | Discharge status: Home / Self Care | Payer: Medicaid Other | Attending: Physician Assistant | Admitting: Physician Assistant

## 2021-02-22 ENCOUNTER — Encounter (HOSPITAL_COMMUNITY): Payer: Self-pay | Admitting: Emergency Medicine

## 2021-02-22 DIAGNOSIS — R059 Cough, unspecified: Secondary | ICD-10-CM | POA: Diagnosis not present

## 2021-02-22 DIAGNOSIS — J302 Other seasonal allergic rhinitis: Secondary | ICD-10-CM

## 2021-02-22 MED ORDER — PROMETHAZINE-DM 6.25-15 MG/5ML PO SYRP: 5.0000 mL | ORAL_SOLUTION | Freq: Four times a day (QID) | ORAL | 0 refills | Status: DC | PRN

## 2021-02-22 MED ORDER — BENZONATATE 100 MG PO CAPS: 200.0000 mg | ORAL_CAPSULE | Freq: Three times a day (TID) | ORAL | 0 refills | Status: DC | PRN

## 2021-02-22 MED ORDER — PREDNISONE 20 MG PO TABS: 40.0000 mg | ORAL_TABLET | Freq: Every day | ORAL | 0 refills | Status: AC

## 2021-02-22 NOTE — Discharge Instructions (Addendum)
Take prednisone as prescribed.  Monitor your blood sugar very closely while taking this.  Avoid carbohydrates make sure you are drinking plenty of fluid.  If your blood sugar is elevated please stop the medication and contact us or your primary care provider.  Take Promethazine DM for cough.  This make you sleepy so not drive or drink alcohol taking it.  You can use Tessalon during the day for cough.  Continue allergy medication.  If you have any worsening symptoms please return for reevaluation.

## 2021-02-22 NOTE — ED Triage Notes (Signed)
Pt reports for past 3 days had "itchy cough" after keeping his sick nephew.

## 2021-02-22 NOTE — ED Provider Notes (Signed)
[facility]    CSN: [id number] Arrival date & time: 02/22/21  0802      History   Chief Complaint Chief Complaint  Patient presents with   Cough    HPI Christopher Abbott is a 45 y.o. male.   Patient presents today with a 3-day history of allergy flare.  He reports itchy/scratchy throat as well as cough and some mild nasal congestion.  He reports current symptoms are similar to previous episodes of seasonal allergies.  He typically gets this 1-2 times per year and requires short course of prednisone as well as symptomatic management of cough.  He was last seen and treated in June 2022.  He does have a history of diabetes but reports he does well with prednisone as his diabetes is well controlled and he monitors his diet for carbohydrates.  He does report that his nephew had allergy symptoms but denies any additional known sick contacts.  He has no concern for COVID-19 and declines testing today.  He denies any additional symptoms including fever, shortness of breath, chest pain, nausea, vomiting, headache, body aches.  He has been taking over-the-counter antihistamines without improvement of symptoms.  Denies any recent antibiotic use.   Past Medical History:  Diagnosis Date   Diabetes mellitus    type 2   GERD (gastroesophageal reflux disease)    Hypertension     Patient Active Problem List   Diagnosis Date Noted   Internal hemorrhoid, bleeding 08/29/2017   Type 2 diabetes mellitus ([place]) 08/06/2017   Achilles rupture, left 07/18/2015   Rupture of left Achilles tendon 07/18/2015    Past Surgical History:  Procedure Laterality Date   ACHILLES TENDON SURGERY Left 07/18/2015   Procedure: LEFT ACHILLES TENDON DIRECT PRIMARY REPAIR;  Surgeon: Mcarthur Rossetti, MD;  Location: [place];  Service: Orthopedics;  Laterality: Left;   COLONOSCOPY WITH PROPOFOL N/A 08/14/2017   Procedure: COLONOSCOPY WITH PROPOFOL;  Surgeon: Lin Landsman, MD;  Location: [facility];   Service: Gastroenterology;  Laterality: N/A;   NO PAST SURGERIES         Home Medications    Prior to Admission medications   Medication Sig Start Date End Date Taking? Authorizing Provider  predniSONE (DELTASONE) 20 MG tablet Take 2 tablets (40 mg total) by mouth daily for 4 days. 02/22/21 02/26/21 Yes Edin Kon K, PA-C  ACCU-CHEK FASTCLIX LANCETS MISC USE AS DIRECTED TO CHECK BLOOD SUAGR DX: E11.9 08/28/17   [provider]  ACCU-CHEK GUIDE test strip USE AS DIRECTED TO TEST BLOOD SUGAR TWICE A DAY DX:E11.9 08/29/17   [provider]  B-D ULTRAFINE III SHORT PEN 31G X 8 MM MISC USE WITH INSULIN PEN INJECTIONS DAILY ( DX: E11.9) 09/17/17   [provider]  benzonatate (TESSALON PERLES) 100 MG capsule Take 2 capsules (200 mg total) by mouth 3 (three) times daily as needed for cough. 02/22/21   Abdalrahman Clementson, Derry Skill, PA-C  Blood Glucose Monitoring Suppl (ACCU-CHEK GUIDE) w/Device KIT USE AS DIRECTED DX E11.9 (MEDICAID PREFERRED) 08/28/17   [provider]  esomeprazole (NEXIUM) 20 MG capsule Take 20 mg by mouth daily at 12 noon.    [provider]  fluticasone (FLONASE) 50 MCG/ACT nasal spray Place 2 sprays into both nostrils daily. 05/06/18   Melynda Ripple, MD  HYDROcodone-acetaminophen (NORCO/VICODIN) 5-325 MG tablet Take 2 tablets by mouth every 4 (four) hours as needed. 11/21/19   Faustino Congress, NP  hydrocortisone (ANUSOL-HC) 2.5 % rectal cream Apply rectally 2 times  daily 02/07/17   Robyn Haber, MD  ibuprofen (ADVIL,MOTRIN) 600 MG tablet Take 1 tablet (600 mg total) by mouth every 6 (six) hours as needed. 09/06/17   Moss, Amber, DO  insulin glargine (LANTUS) 100 UNIT/ML injection Inject 70 Units into the skin daily.     [provider]  JANUMET XR [phone] MG TB24 Take 1 tablet by mouth daily. 07/10/15   [provider]  LANTUS SOLOSTAR 100 UNIT/ML Solostar Pen INJECT 45 UNITS INTO THE SKIN 4 TIMES A DAY 08/09/17   [provider]  Liraglutide (VICTOZA) 18 MG/3ML SOPN Inject 1.8 mg into the skin daily.    [provider]  lisinopril-hydrochlorothiazide (PRINZIDE,ZESTORETIC) 20-12.5 MG tablet Take 1 tablet by mouth daily. for high blood pressure 07/02/15   [provider]  metFORMIN (GLUCOPHAGE) 1000 MG tablet Take 1,000 mg by mouth 2 (two) times daily with a meal.    [provider]  pravastatin (PRAVACHOL) 40 MG tablet Take 40 mg by mouth every evening. for cholesterol 05/30/15   [provider]  promethazine-dextromethorphan (PROMETHAZINE-DM) 6.25-15 MG/5ML syrup Take 5 mLs by mouth 4 (four) times daily as needed for cough. 02/22/21   Jabri Blancett K, PA-C  SUPREP BOWEL PREP KIT 17.5-3.13-1.6 GM/177ML SOLN See admin instructions. 08/12/17   [provider]  Vitamin D, Ergocalciferol, (DRISDOL) 50000 units CAPS capsule TAKE ONE CAPSULE BY MOUTH ONCE A WEEK FOR 12 WEEKS 08/09/17   [provider]    Family History Family History  Problem Relation Age of Onset   Diabetes Mother    Diabetes Father     Social History Social History   Tobacco Use   Smoking status: Some Days    Packs/day: 0.20    Types: Cigarettes   Smokeless tobacco: Never  Substance Use Topics   Alcohol use: Not Currently    Comment: none now, once per week or less   Drug use: No     Allergies   Patient has no known allergies.   Review of Systems Review of Systems  Constitutional:  Negative for activity change, appetite change, fatigue and fever.  HENT:  Positive for congestion and sore throat. Negative for sinus pressure and sneezing.   Respiratory:  Positive for cough. Negative for shortness of breath.   Cardiovascular:  Negative for chest pain.  Gastrointestinal:  Negative for abdominal pain, diarrhea, nausea and vomiting.  Musculoskeletal:  Negative for arthralgias and myalgias.  Neurological:  Negative for dizziness, light-headedness and headaches.    Physical  Exam Triage Vital Signs ED Triage Vitals  Enc Vitals Group     BP 02/22/21 0829 123/79     Pulse Rate 02/22/21 0829 93     Resp 02/22/21 0829 17     Temp 02/22/21 0829 98.5 F (36.9 C)     Temp Source 02/22/21 0829 Oral     SpO2 02/22/21 0829 95 %     Weight --      Height --      Head Circumference --      Peak Flow --      Pain Score 02/22/21 0827 0     Pain Loc --      Pain Edu? --      Excl. in [place]? --    No data found.  Updated Vital Signs BP 123/79 (BP Location: Right Arm)   Pulse 93   Temp 98.5 F (36.9 C) (Oral)   Resp 17   SpO2 95%   Visual Acuity  Right Eye Distance:   Left Eye Distance:   Bilateral Distance:    Right Eye Near:   Left Eye Near:    Bilateral Near:     Physical Exam Vitals reviewed.  Constitutional:      General: He is awake.     Appearance: Normal appearance. He is well-developed. He is not ill-appearing.     Comments: Very pleasant male appears stated age in no acute distress sitting comfortably in exam room  HENT:     Head: Normocephalic and atraumatic.     Right Ear: Tympanic membrane, ear canal and external ear normal. Tympanic membrane is not erythematous or bulging.     Left Ear: Tympanic membrane, ear canal and external ear normal. Tympanic membrane is not erythematous or bulging.     Nose: Nose normal.     Mouth/Throat:     Pharynx: Uvula midline. Posterior oropharyngeal erythema present. No oropharyngeal exudate.     Comments: Moderate erythema and drainage in posterior pharynx Cardiovascular:     Rate and Rhythm: Normal rate and regular rhythm.     Heart sounds: Normal heart sounds, S1 normal and S2 normal. No murmur heard. Pulmonary:     Effort: Pulmonary effort is normal. No accessory muscle usage or respiratory distress.     Breath sounds: Normal breath sounds. No stridor. No wheezing, rhonchi or rales.     Comments: Clear to auscultation bilaterally Abdominal:     General: Bowel sounds are normal.     Palpations:  Abdomen is soft.     Tenderness: There is no abdominal tenderness.  Neurological:     Mental Status: He is alert.  Psychiatric:        Behavior: Behavior is cooperative.     UC Treatments / Results  Labs (all labs ordered are listed, but only abnormal results are displayed) Labs Reviewed - No data to display  EKG   Radiology No results found.  Procedures Procedures (including critical care time)  Medications Ordered in UC Medications - No data to display  Initial Impression / Assessment and Plan / UC Course  I have reviewed the triage vital signs and the nursing notes.  Pertinent labs & imaging results that were available during my care of the patient were reviewed by me and considered in my medical decision making (see chart for details).     No evidence of acute infection that would warrant initiation of antibiotic.  Patient was provided refill of prednisone for a dose of 40 mg for 4 days.  Discussed that this will raise his blood sugar given history of diabetes and so he is to limit carbohydrates and drink plenty fluid.  Recommended he monitor his blood sugar regularly and if this remains elevated he needs to be evaluated for medication adjustment.  He was provided refills of Tessalon and Promethazine DM to manage cough.  He was instructed not to drive drink alcohol with promethazine as this can cause drowsiness.  He is to continue over-the-counter medications including Flonase and antihistamines.  Offered viral testing given possible sick contact the patient declined this today as symptoms are identical to previous episodes of allergies.  If he has any recurrent or worsening symptoms he will return for reevaluation.  Discussed alarm symptoms that warrant emergent evaluation.  Strict return precautions given to which he expressed understanding.   Final Clinical Impressions(s) / UC Diagnoses   Final diagnoses:  Seasonal allergies  Cough     Discharge Instructions  Take prednisone as prescribed.  Monitor your blood sugar very closely while taking this.  Avoid carbohydrates make sure you are drinking plenty of fluid.  If your blood sugar is elevated please stop the medication and contact us or your primary care provider.  Take Promethazine DM for cough.  This make you sleepy so not drive or drink alcohol taking it.  You can use Tessalon during the day for cough.  Continue allergy medication.  If you have any worsening symptoms please return for reevaluation.     ED Prescriptions     Medication Sig Dispense Auth. Provider   benzonatate (TESSALON PERLES) 100 MG capsule Take 2 capsules (200 mg total) by mouth 3 (three) times daily as needed for cough. 30 capsule Binnie Droessler K, PA-C   promethazine-dextromethorphan (PROMETHAZINE-DM) 6.25-15 MG/5ML syrup Take 5 mLs by mouth 4 (four) times daily as needed for cough. 240 mL Cleopatra Sardo K, PA-C   predniSONE (DELTASONE) 20 MG tablet Take 2 tablets (40 mg total) by mouth daily for 4 days. 8 tablet Dino Borntreger, Derry Skill, PA-C      PDMP not reviewed this encounter.   Terrilee Croak, PA-C 02/22/21 [phone]

## 2021-03-14 ENCOUNTER — Encounter (HOSPITAL_COMMUNITY): Payer: Self-pay

## 2021-03-14 ENCOUNTER — Other Ambulatory Visit: Payer: Self-pay

## 2021-03-14 ENCOUNTER — Ambulatory Visit (HOSPITAL_COMMUNITY)
Admission: EM | Admit: 2021-03-14 | Discharge: 2021-03-14 | Disposition: A | Discharge status: Home / Self Care | Payer: Medicaid Other

## 2021-03-14 DIAGNOSIS — M6208 Separation of muscle (nontraumatic), other site: Secondary | ICD-10-CM

## 2021-03-14 NOTE — ED Triage Notes (Signed)
Pt presents with hernia that he has for years that is progressing.

## 2021-03-14 NOTE — Discharge Instructions (Signed)
Avoid doing crunches or sits ups, planks, double leg lifts, scissors, or push-ups as these can make your diastasis recti worse.   Try to do exercises that help to strengthen your deep core muscles including the chair, toe taps/toedips, straight arm pull downs.   Return or go to the Emergency Department if symptoms worsen or do not improve in the next few days.

## 2021-03-14 NOTE — ED Provider Notes (Signed)
[facility]    CSN: [id number] Arrival date & time: 03/14/21  0802      History   Chief Complaint Chief Complaint  Patient presents with   Hernia    HPI Christopher Abbott is a 45 y.o. male.   Patient here for evaluation of abdominal mass that has been ongoing for the past several years.  Reports that he is concerned about a possible epigastric hernia.  Reports he noticed the bulging when doing lower leg lifts and other exercises.  Denies any abdominal pain, nausea, vomiting, or diarrhea.  Patient has not been evaluated for this in the past.  Reports he was diagnosed with an umbilical hernia as a child.  Denies any trauma, injury, or other precipitating event.  Denies any specific alleviating or aggravating factors.  Denies any fevers, chest pain, shortness of breath, numbness, tingling, weakness, abdominal pain, or headaches.    The history is provided by the patient.   Past Medical History:  Diagnosis Date   Diabetes mellitus    type 2   GERD (gastroesophageal reflux disease)    Hypertension     Patient Active Problem List   Diagnosis Date Noted   Internal hemorrhoid, bleeding 08/29/2017   Type 2 diabetes mellitus ([place]) 08/06/2017   Achilles rupture, left 07/18/2015   Rupture of left Achilles tendon 07/18/2015    Past Surgical History:  Procedure Laterality Date   ACHILLES TENDON SURGERY Left 07/18/2015   Procedure: LEFT ACHILLES TENDON DIRECT PRIMARY REPAIR;  Surgeon: Mcarthur Rossetti, MD;  Location: [facility];  Service: Orthopedics;  Laterality: Left;   COLONOSCOPY WITH PROPOFOL N/A 08/14/2017   Procedure: COLONOSCOPY WITH PROPOFOL;  Surgeon: Lin Landsman, MD;  Location: [facility] ENDOSCOPY;  Service: Gastroenterology;  Laterality: N/A;   NO PAST SURGERIES         Home Medications    Prior to Admission medications   Medication Sig Start Date End Date Taking? Authorizing Provider  ACCU-CHEK FASTCLIX LANCETS MISC USE AS DIRECTED TO CHECK BLOOD SUAGR  DX: E11.9 08/28/17   [provider]  ACCU-CHEK GUIDE test strip USE AS DIRECTED TO TEST BLOOD SUGAR TWICE A DAY DX:E11.9 08/29/17   [provider]  B-D ULTRAFINE III SHORT PEN 31G X 8 MM MISC USE WITH INSULIN PEN INJECTIONS DAILY ( DX: E11.9) 09/17/17   [provider]  benzonatate (TESSALON PERLES) 100 MG capsule Take 2 capsules (200 mg total) by mouth 3 (three) times daily as needed for cough. 02/22/21   Raspet, Derry Skill, PA-C  Blood Glucose Monitoring Suppl (ACCU-CHEK GUIDE) w/Device KIT USE AS DIRECTED DX E11.9 (MEDICAID PREFERRED) 08/28/17   [provider]  esomeprazole (NEXIUM) 20 MG capsule Take 20 mg by mouth daily at 12 noon.    [provider]  fluticasone (FLONASE) 50 MCG/ACT nasal spray Place 2 sprays into both nostrils daily. 05/06/18   Melynda Ripple, MD  HYDROcodone-acetaminophen (NORCO/VICODIN) 5-325 MG tablet Take 2 tablets by mouth every 4 (four) hours as needed. 11/21/19   Faustino Congress, NP  hydrocortisone (ANUSOL-HC) 2.5 % rectal cream Apply rectally 2 times daily 02/07/17   Robyn Haber, MD  ibuprofen (ADVIL,MOTRIN) 600 MG tablet Take 1 tablet (600 mg total) by mouth every 6 (six) hours as needed. 09/06/17   Moss, Amber, DO  insulin glargine (LANTUS) 100 UNIT/ML injection Inject 70 Units into the skin daily.     [provider]  JANUMET XR [phone] MG TB24 Take 1 tablet by mouth daily. 07/10/15  [provider]  LANTUS SOLOSTAR 100 UNIT/ML Solostar Pen INJECT 45 UNITS INTO THE SKIN 4 TIMES A DAY 08/09/17   [provider]  Liraglutide (VICTOZA) 18 MG/3ML SOPN Inject 1.8 mg into the skin daily.    [provider]  lisinopril-hydrochlorothiazide (PRINZIDE,ZESTORETIC) 20-12.5 MG tablet Take 1 tablet by mouth daily. for high blood pressure 07/02/15   [provider]  metFORMIN (GLUCOPHAGE) 1000 MG tablet Take 1,000 mg by mouth 2 (two) times daily with a meal.    [provider]   pravastatin (PRAVACHOL) 40 MG tablet Take 40 mg by mouth every evening. for cholesterol 05/30/15   [provider]  promethazine-dextromethorphan (PROMETHAZINE-DM) 6.25-15 MG/5ML syrup Take 5 mLs by mouth 4 (four) times daily as needed for cough. 02/22/21   Raspet, Erin K, PA-C  SUPREP BOWEL PREP KIT 17.5-3.13-1.6 GM/177ML SOLN See admin instructions. 08/12/17   [provider]  Vitamin D, Ergocalciferol, (DRISDOL) 50000 units CAPS capsule TAKE ONE CAPSULE BY MOUTH ONCE A WEEK FOR 12 WEEKS 08/09/17   [provider]    Family History Family History  Problem Relation Age of Onset   Diabetes Mother    Diabetes Father     Social History Social History   Tobacco Use   Smoking status: Some Days    Packs/day: 0.20    Types: Cigarettes   Smokeless tobacco: Never  Substance Use Topics   Alcohol use: Not Currently    Comment: none now, once per week or less   Drug use: No     Allergies   Patient has no known allergies.   Review of Systems Review of Systems  All other systems reviewed and are negative.   Physical Exam Triage Vital Signs ED Triage Vitals  Enc Vitals Group     BP 03/14/21 0842 (!) 149/95     Pulse Rate 03/14/21 0842 89     Resp 03/14/21 0842 18     Temp 03/14/21 0842 98 F (36.7 C)     Temp Source 03/14/21 0842 Oral     SpO2 03/14/21 0842 95 %     Weight --      Height --      Head Circumference --      Peak Flow --      Pain Score 03/14/21 0838 6     Pain Loc --      Pain Edu? --      Excl. in [place]? --    No data found.  Updated Vital Signs BP (!) 149/95 (BP Location: Left Arm)   Pulse 89   Temp 98 F (36.7 C) (Oral)   Resp 18   SpO2 95%   Visual Acuity Right Eye Distance:   Left Eye Distance:   Bilateral Distance:    Right Eye Near:   Left Eye Near:    Bilateral Near:     Physical Exam Vitals and nursing note reviewed.  Constitutional:      General: He is not in acute distress.    Appearance: Normal  appearance. He is not ill-appearing, toxic-appearing or diaphoretic.  HENT:     Head: Normocephalic and atraumatic.  Eyes:     Conjunctiva/sclera: Conjunctivae normal.  Cardiovascular:     Rate and Rhythm: Normal rate.     Pulses: Normal pulses.  Pulmonary:     Effort: Pulmonary effort is normal.  Abdominal:     General: Abdomen is flat. Bowel sounds are normal. There is no distension or abdominal bruit.  There are no signs of injury.     Palpations: There is no mass.     Tenderness: There is no abdominal tenderness.     Hernia: No hernia is present.     Comments: Upon bilateral leg lefts, bulging noted to upper abdomen  Musculoskeletal:        General: Normal range of motion.     Cervical back: Normal range of motion.  Skin:    General: Skin is warm and dry.  Neurological:     General: No focal deficit present.     Mental Status: He is alert and oriented to person, place, and time.  Psychiatric:        Mood and Affect: Mood normal.     UC Treatments / Results  Labs (all labs ordered are listed, but only abnormal results are displayed) Labs Reviewed - No data to display  EKG   Radiology No results found.  Procedures Procedures (including critical care time)  Medications Ordered in UC Medications - No data to display  Initial Impression / Assessment and Plan / UC Course  I have reviewed the triage vital signs and the nursing notes.  Pertinent labs & imaging results that were available during my care of the patient were reviewed by me and considered in my medical decision making (see chart for details).    Assessment negative for red flags or concerns.  This is likely diastases recti.  Patient instructed to avoid doing exercises that cause the bulging.  Recommend exercises that help to strengthen and deep core muscles.  Follow-up with primary care provider for reevaluation.  Strict ED follow-up for any worsening symptoms. Final Clinical Impressions(s) / UC Diagnoses    Final diagnoses:  Diastasis recti     Discharge Instructions      Avoid doing crunches or sits ups, planks, double leg lifts, scissors, or push-ups as these can make your diastasis recti worse.   Try to do exercises that help to strengthen your deep core muscles including the chair, toe taps/toedips, straight arm pull downs.   Return or go to the Emergency Department if symptoms worsen or do not improve in the next few days.      ED Prescriptions   None    PDMP not reviewed this encounter.   Pearson Forster, NP 03/14/21 [phone]

## 2021-03-19 ENCOUNTER — Ambulatory Visit: Payer: Medicaid Other | Admitting: Orthopaedic Surgery

## 2021-03-19 ENCOUNTER — Encounter: Payer: Self-pay | Admitting: Orthopaedic Surgery

## 2021-03-19 DIAGNOSIS — M25512 Pain in left shoulder: Secondary | ICD-10-CM | POA: Diagnosis not present

## 2021-03-19 DIAGNOSIS — G8929 Other chronic pain: Secondary | ICD-10-CM | POA: Diagnosis not present

## 2021-03-19 DIAGNOSIS — M25511 Pain in right shoulder: Secondary | ICD-10-CM

## 2021-03-19 NOTE — Progress Notes (Signed)
She is a 45 year old gentleman who has been dealing with chronic pain with both shoulders.  He has been seen for this now for well over a year.  X-rays of both shoulders were obtained in September 2021 and showed no malalignment.  He does a lot of heavy overhead repetitive work.  He has had steroid injections in both shoulders.  He is worked on activity modification and taking anti-inflammatories.  He has been do stretching exercises as well.  He has tried and failed conservative treatment for both shoulders now for well over 12 months.  Both shoulders feel weak and hurt quite a bit.  He denies any acute change in medical status.  There is no listed headache, chest pain, shortness of breath, fever, chills, nausea, vomiting.  He is not a diabetic.  Examination of both shoulder shows good range of motion and good strength but definitely signs of impingement which is quite significant on both shoulders with positive Neer and Hawkins signs.  I did look at the x-rays from last year and went over these with him.  There is no significant internal derangement of either shoulder that can be seen on plain x-rays.  There was decrease in subacromial outlet on the outlet views.  Given the failed conservative treatment over 12 months of both shoulders a MRI is warranted of both shoulders to assess the rotator cuff on both sides.  We can then develop a treatment plan based off what those MRIs show.

## 2021-03-20 ENCOUNTER — Other Ambulatory Visit: Payer: Self-pay

## 2021-03-20 ENCOUNTER — Other Ambulatory Visit: Payer: Self-pay | Admitting: Orthopaedic Surgery

## 2021-03-20 DIAGNOSIS — M25512 Pain in left shoulder: Secondary | ICD-10-CM

## 2021-03-20 DIAGNOSIS — Z96612 Presence of left artificial shoulder joint: Secondary | ICD-10-CM

## 2021-03-20 DIAGNOSIS — Z96611 Presence of right artificial shoulder joint: Secondary | ICD-10-CM

## 2021-03-20 DIAGNOSIS — M25511 Pain in right shoulder: Secondary | ICD-10-CM

## 2021-03-20 DIAGNOSIS — G8929 Other chronic pain: Secondary | ICD-10-CM

## 2021-03-26 ENCOUNTER — Telehealth: Payer: Self-pay | Admitting: Orthopaedic Surgery

## 2021-03-26 ENCOUNTER — Other Ambulatory Visit: Payer: Self-pay | Admitting: Orthopaedic Surgery

## 2021-03-26 MED ORDER — DIAZEPAM 5 MG PO TABS: 5.0000 mg | ORAL_TABLET | Freq: Once | ORAL | 0 refills | Status: AC

## 2021-03-26 NOTE — Telephone Encounter (Signed)
Pt called stating he has his appt for MRI and need medication to calm him during MRI. Please send medication to [facility] on [address] [place]. Pt phone number is [phone].

## 2021-03-26 NOTE — Telephone Encounter (Signed)
Please advise 

## 2021-03-28 ENCOUNTER — Other Ambulatory Visit: Payer: Self-pay

## 2021-03-28 ENCOUNTER — Encounter: Payer: Self-pay | Admitting: Podiatry

## 2021-03-28 ENCOUNTER — Ambulatory Visit (INDEPENDENT_AMBULATORY_CARE_PROVIDER_SITE_OTHER): Payer: Medicaid Other

## 2021-03-28 ENCOUNTER — Ambulatory Visit: Payer: Medicaid Other | Admitting: Podiatry

## 2021-03-28 ENCOUNTER — Other Ambulatory Visit: Payer: Self-pay | Admitting: Podiatry

## 2021-03-28 DIAGNOSIS — M779 Enthesopathy, unspecified: Secondary | ICD-10-CM

## 2021-03-28 DIAGNOSIS — M79671 Pain in right foot: Secondary | ICD-10-CM

## 2021-03-28 DIAGNOSIS — S86012A Strain of left Achilles tendon, initial encounter: Secondary | ICD-10-CM

## 2021-03-28 MED ORDER — TERBINAFINE HCL 250 MG PO TABS: 250.0000 mg | ORAL_TABLET | Freq: Every day | ORAL | 0 refills | Status: DC

## 2021-03-28 NOTE — Progress Notes (Signed)
Subjective:   Patient ID: Christopher Abbott, male   DOB: 45 y.o.   MRN: [id number]   HPI Patient presents stating she has had some blisters on the arch of both feet and he states they come and go and also he had a history of a number of years ago of tear of his Achilles tendon and gets occasional burning-like symptoms.  Patient smokes very mildly and tries to be active   Review of Systems  All other systems reviewed and are negative.      Objective:  Physical Exam Vitals and nursing note reviewed.  Constitutional:      Appearance: He is well-developed.  Pulmonary:     Effort: Pulmonary effort is normal.  Musculoskeletal:        General: Normal range of motion.  Skin:    General: Skin is warm.  Neurological:     Mental Status: He is alert.    Neurovascular status intact muscle strength was found to be adequate range of motion adequate with no loss of motion from the Achilles tendon repair left and no muscle strength loss.  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  Reappoint to recheck as needed  X-rays indicate there is small spurring back of the left Achilles but no signs of other pathology

## 2021-04-14 ENCOUNTER — Ambulatory Visit
Admission: RE | Admit: 2021-04-14 | Discharge: 2021-04-14 | Disposition: A | Discharge status: Home / Self Care | Payer: Medicaid Other | Source: Ambulatory Visit | Attending: Orthopaedic Surgery | Admitting: Orthopaedic Surgery

## 2021-04-14 ENCOUNTER — Other Ambulatory Visit: Payer: Self-pay

## 2021-04-14 DIAGNOSIS — G8929 Other chronic pain: Secondary | ICD-10-CM

## 2021-04-14 DIAGNOSIS — Z96612 Presence of left artificial shoulder joint: Secondary | ICD-10-CM

## 2021-04-14 DIAGNOSIS — M25512 Pain in left shoulder: Secondary | ICD-10-CM

## 2021-04-14 DIAGNOSIS — Z96611 Presence of right artificial shoulder joint: Secondary | ICD-10-CM

## 2021-04-14 IMAGING — MR MR SHOULDER*L* W/O CM
5 series · 37 of 40 positions shown · non-contrast
Comparison: Radiographs [DATE].

CLINICAL DATA: Bilateral shoulder pain for 6 months. No improvement
following shoulder injection. No acute injury or prior relevant
surgery.

EXAM:
MRI OF THE LEFT SHOULDER WITHOUT CONTRAST
TECHNIQUE: Multiplanar, multisequence MR imaging of the shoulder was performed.
No intravenous contrast was administered.

[Series 3: T2 fat-sat · axial · 4.0mm · 0.62mm/px · z∈[-67,+59]mm · 8 of 28 slices shown (1 of 3)]
[im 1/28]
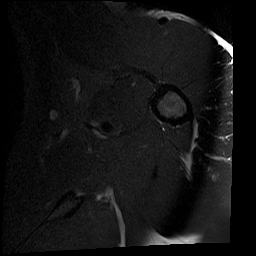
[im 4/28]
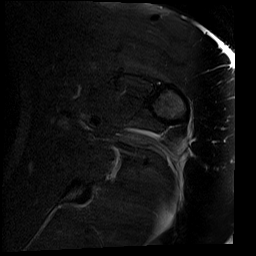
[im 8/28]
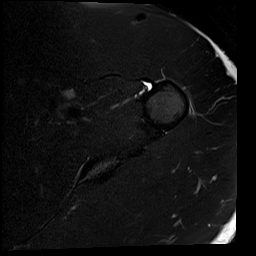
[im 12/28]
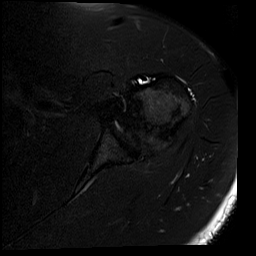
[im 16/28]
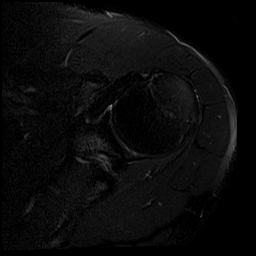
[im 20/28]
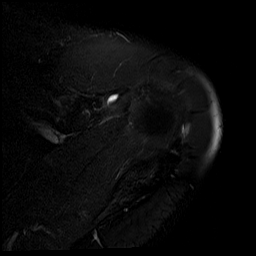
[im 24/28]
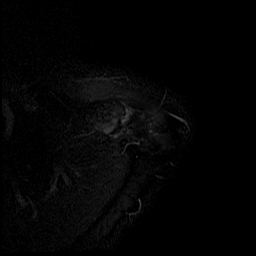
[im 28/28]
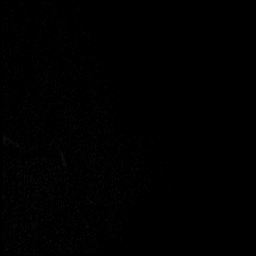

[Series 4: T2 fat-sat · oblique · 4.0mm · 0.66mm/px · 8 of 28 slices shown (2 of 3)]
[im 1/28]
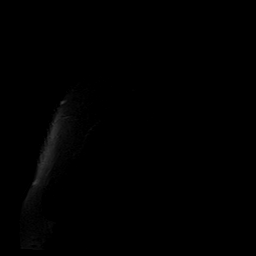
[im 4/28]
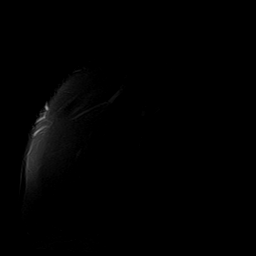
[im 8/28]
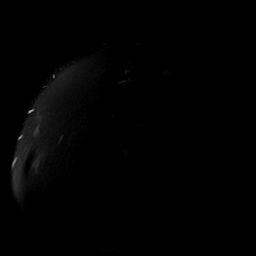
[im 12/28]
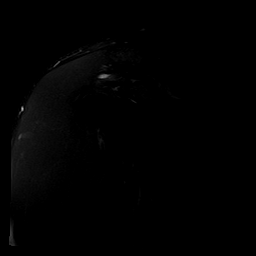
[im 16/28]
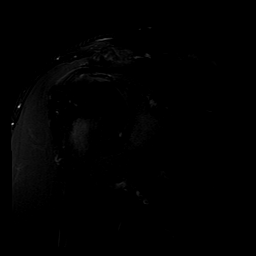
[im 20/28]
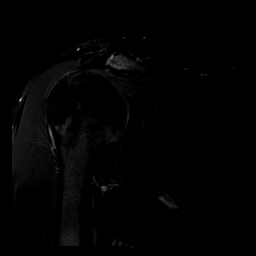
[im 24/28]
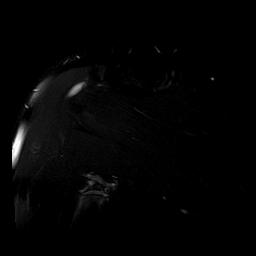
[im 28/28]
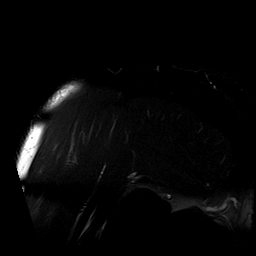

[Series 5: PD · oblique · 4.0mm · 0.33mm/px · 8 of 28 slices shown]
[im 1/28]
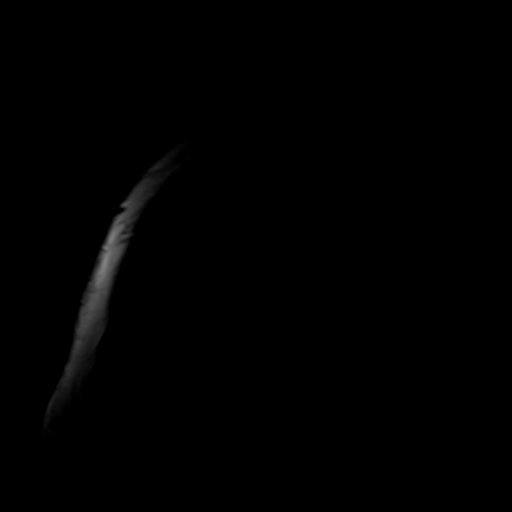
[im 4/28]
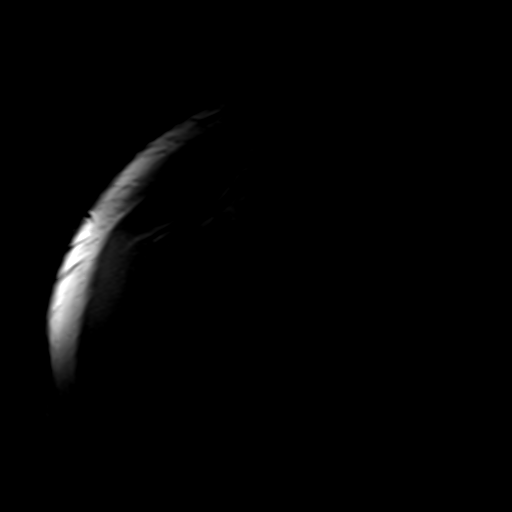
[im 8/28]
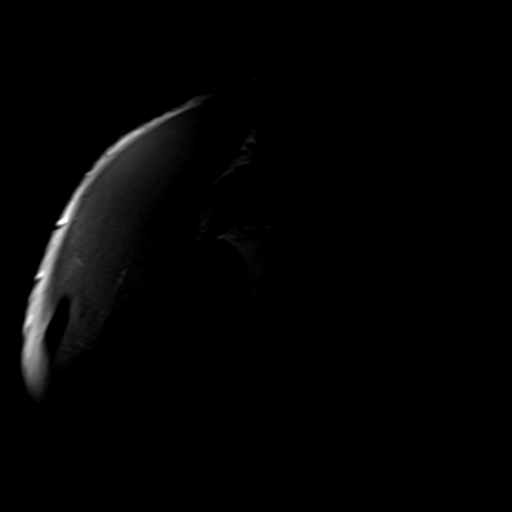
[im 12/28]
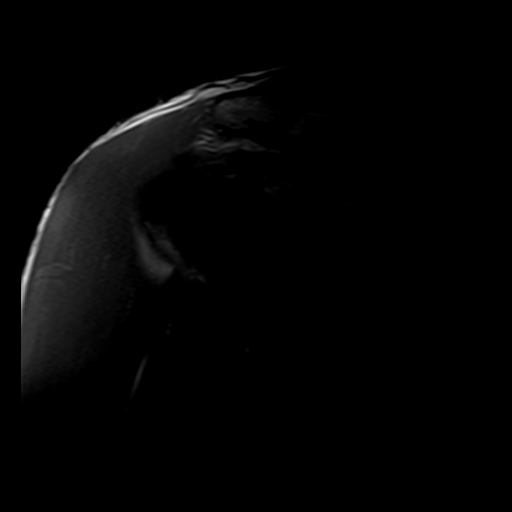
[im 16/28]
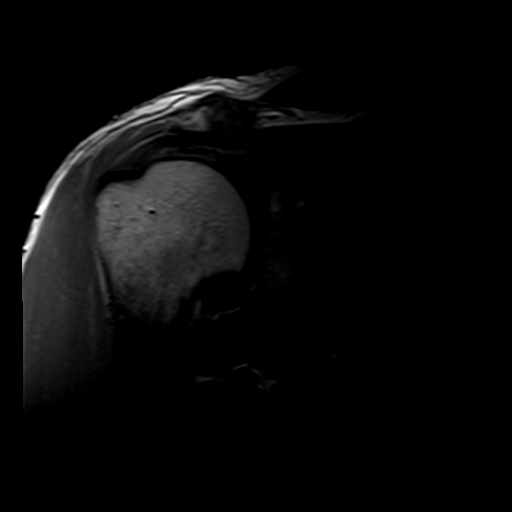
[im 20/28]
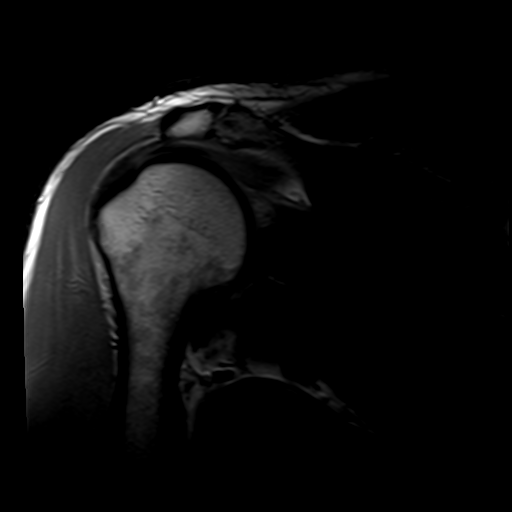
[im 24/28]
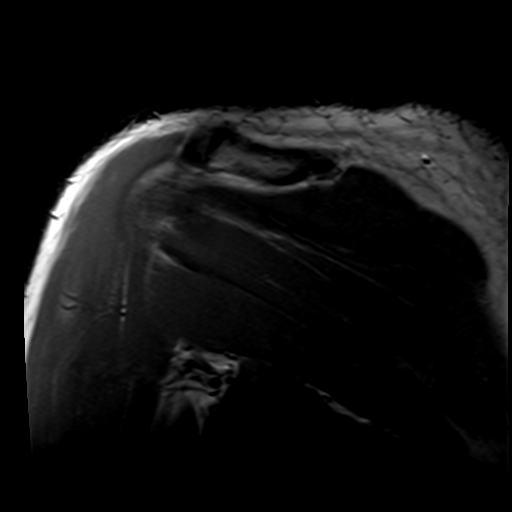
[im 28/28]
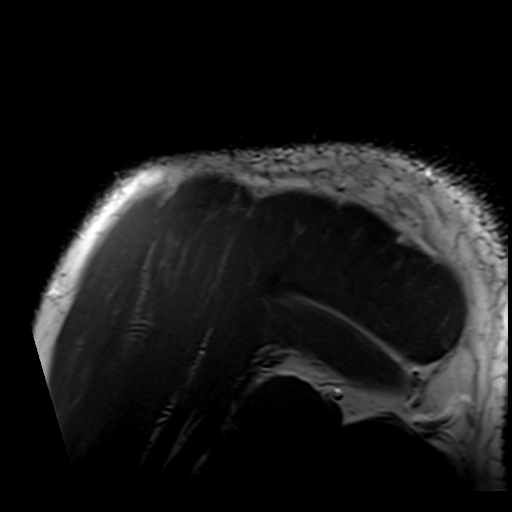

[Series 6: T2 fat-sat · oblique · 4.0mm · 0.66mm/px · 8 of 28 slices shown (3 of 3)]
[im 1/28]
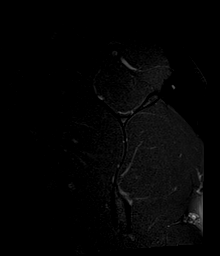
[im 4/28]
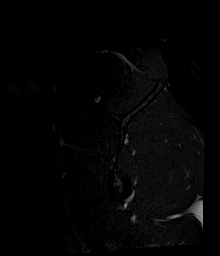
[im 8/28]
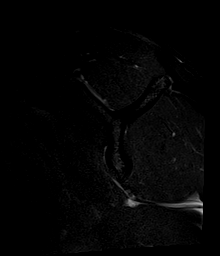
[im 12/28]
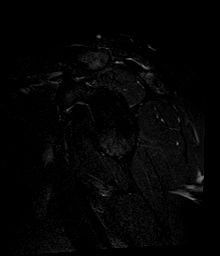
[im 16/28]
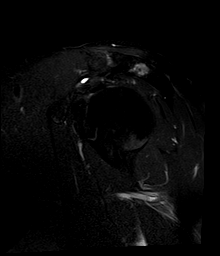
[im 20/28]
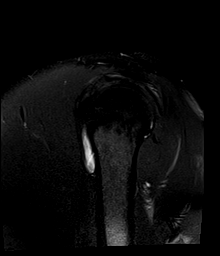
[im 24/28]
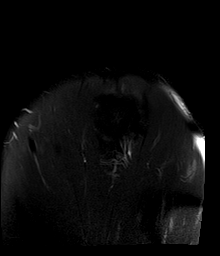
[im 28/28]
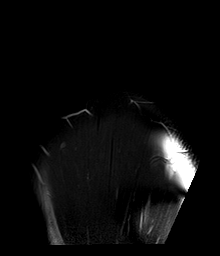

[Series 7: T1 · oblique · 4.0mm · 0.33mm/px · 5 of 28 slices shown]
[im 1/28]
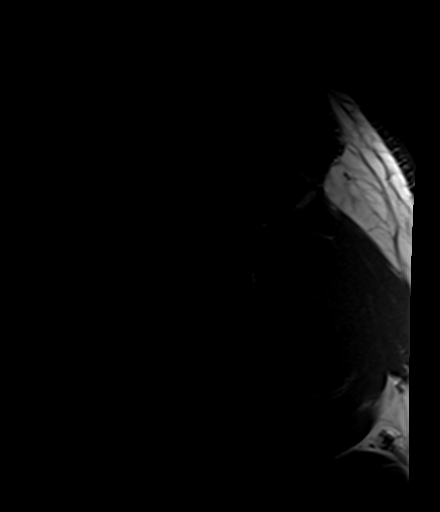
[im 4/28]
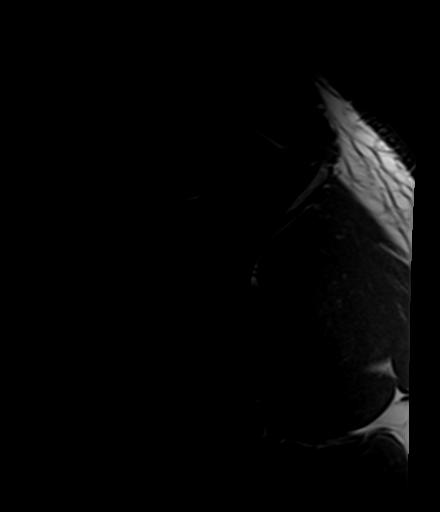
[im 8/28]
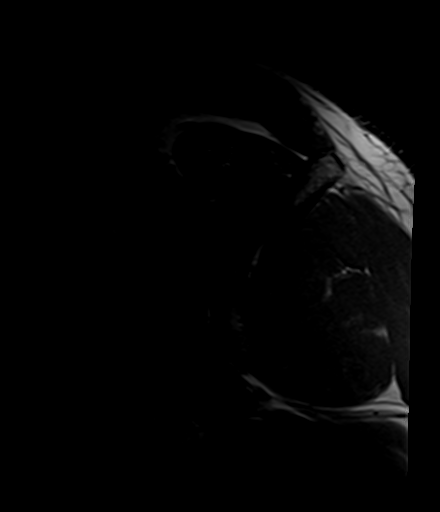
[im 12/28]
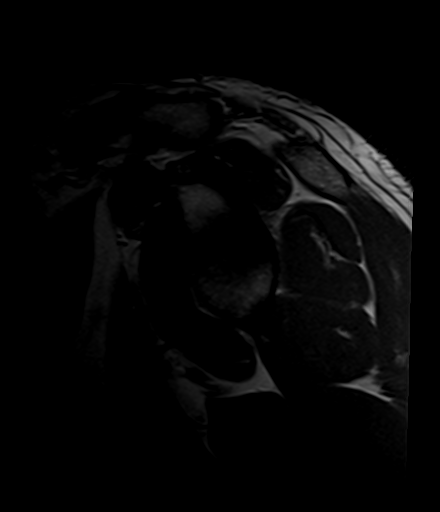
[im 16/28]
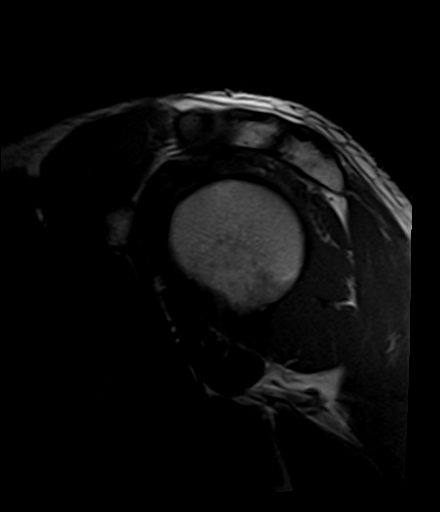

[37 of 40 positions shown; findings below may reference images not displayed]

FINDINGS: Rotator cuff: Mild supraspinatus and infraspinatus tendinosis
without evidence of tear. The subscapularis and teres minor tendons
appear normal.

Muscles:  No focal muscular atrophy or edema.

Biceps long head:  Intact and normally positioned.

Acromioclavicular Joint: The acromion is type 2. Unfused os
acromiale with prominent cyst formation and edema within the
posterior aspect of the acromion adjacent to the synchondrosis.
There are mild acromioclavicular degenerative changes. A small
amount of fluid is present in the subacromial-subdeltoid bursa.

Glenohumeral Joint: Mild glenohumeral degenerative changes. No
significant shoulder joint effusion.

Labrum: Labral assessment limited by the lack of joint fluid. No
evidence of labral tear or paralabral cyst.

Bones: No acute or significant extra-articular osseous findings.

Other: No significant soft tissue findings.
IMPRESSION: 1. Mild supraspinatus and infraspinatus tendinosis. No evidence of
rotator cuff tear.
2. The labrum and biceps tendon appear intact.
3. Unfused os acromiale with prominent reactive edema posteriorly in
the acromion adjacent to the synchondrosis which may contribute to
shoulder pain and rotator cuff impingement.

## 2021-04-14 IMAGING — MR MR SHOULDER*R* W/O CM
5 series · 37 of 40 positions shown · non-contrast
Comparison: Radiographs [DATE]

CLINICAL DATA: Bilateral shoulder pain for 6 months. No relief from
previous shoulder injection. No acute injury or prior relevant
surgery.

EXAM:
MRI OF THE RIGHT SHOULDER WITHOUT CONTRAST
TECHNIQUE: Multiplanar, multisequence MR imaging of the shoulder was performed.
No intravenous contrast was administered.

[Series 3: T2 fat-sat · axial · 4.0mm · 0.62mm/px · z∈[-49,+86]mm · 8 of 30 slices shown (1 of 3)]
[im 1/30]
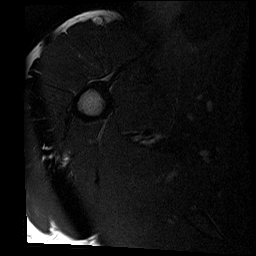
[im 5/30]
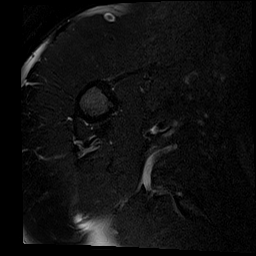
[im 9/30]
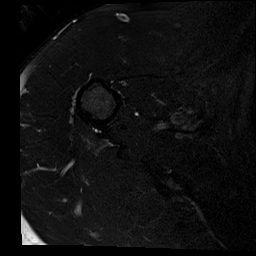
[im 13/30]
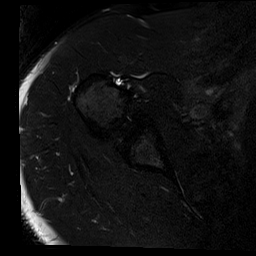
[im 17/30]
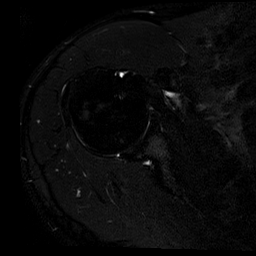
[im 21/30]
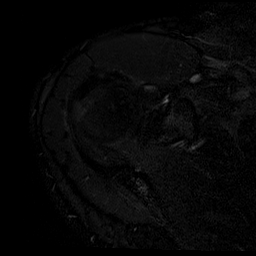
[im 25/30]
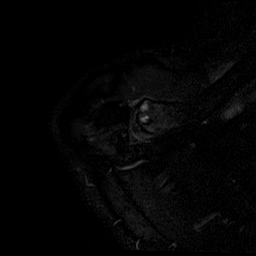
[im 30/30]
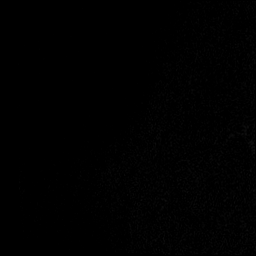

[Series 4: T2 fat-sat · oblique · 4.0mm · 0.62mm/px · 8 of 28 slices shown (2 of 3)]
[im 1/28]
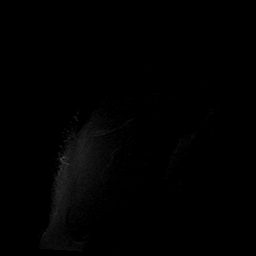
[im 4/28]
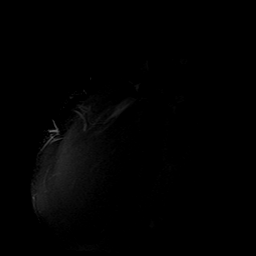
[im 8/28]
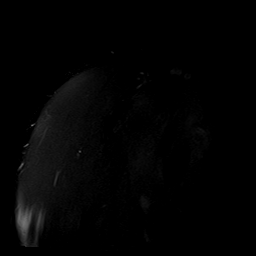
[im 12/28]
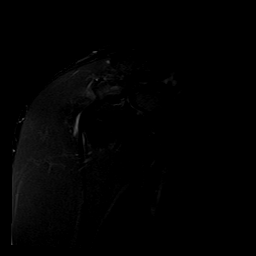
[im 16/28]
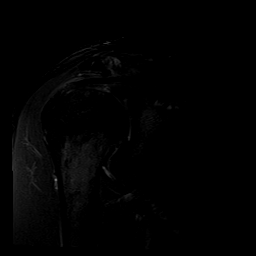
[im 20/28]
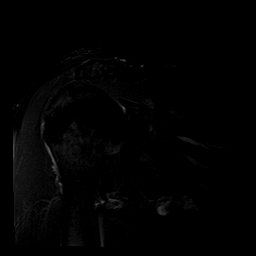
[im 24/28]
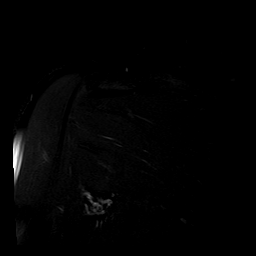
[im 28/28]
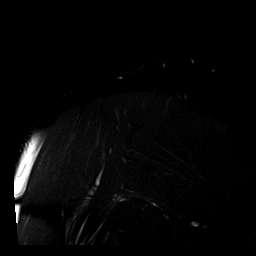

[Series 5: PD · oblique · 4.0mm · 0.31mm/px · 8 of 28 slices shown]
[im 1/28]
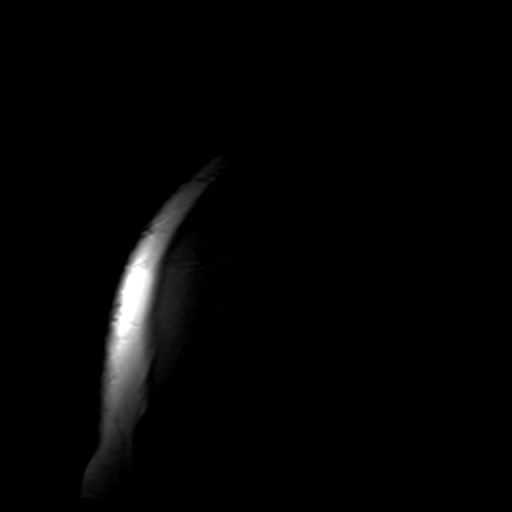
[im 4/28]
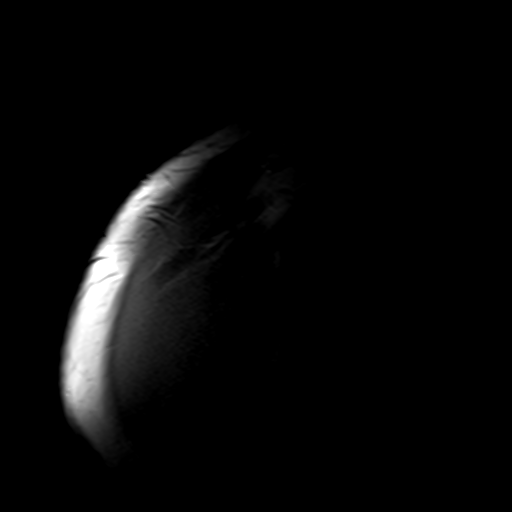
[im 8/28]
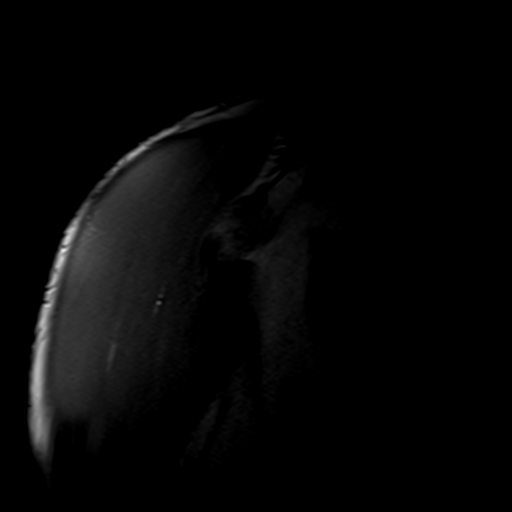
[im 12/28]
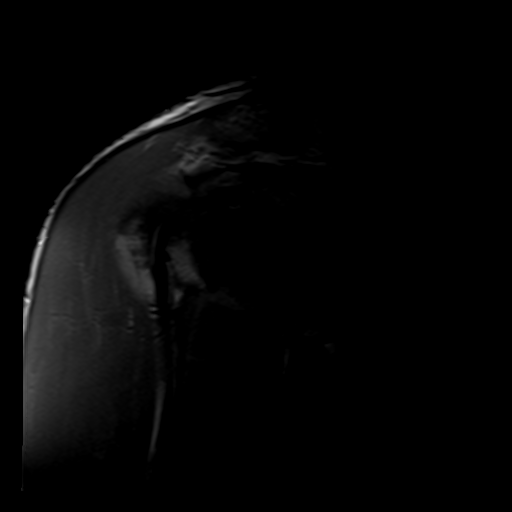
[im 16/28]
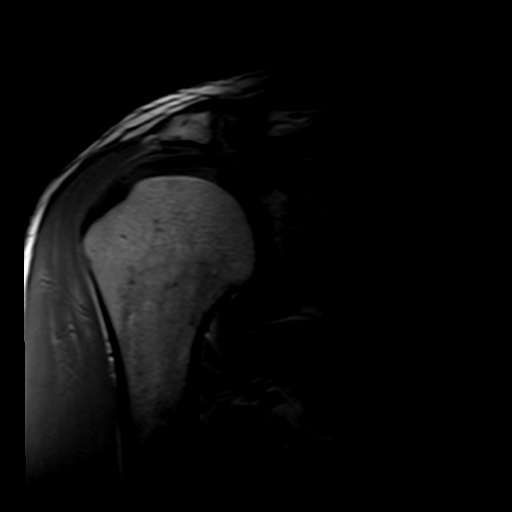
[im 20/28]
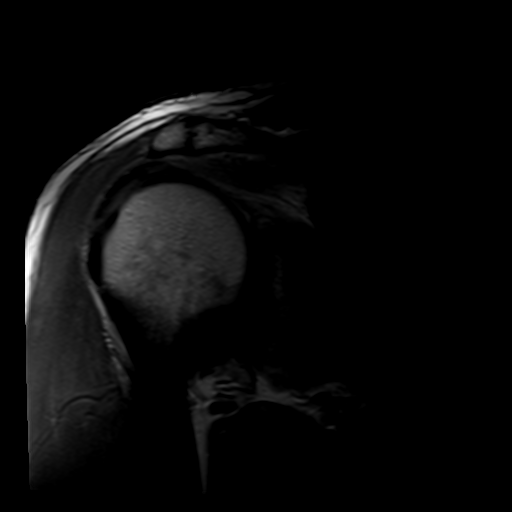
[im 24/28]
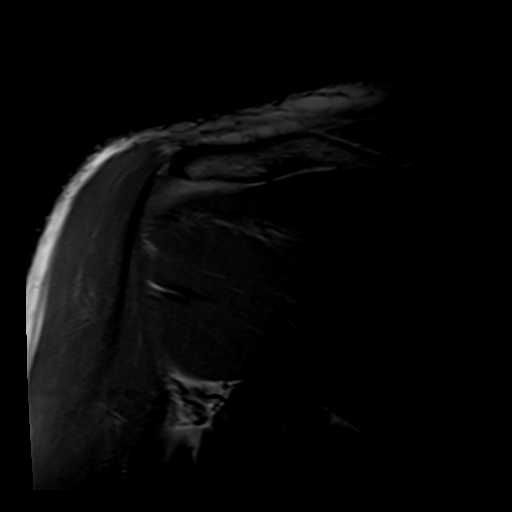
[im 28/28]
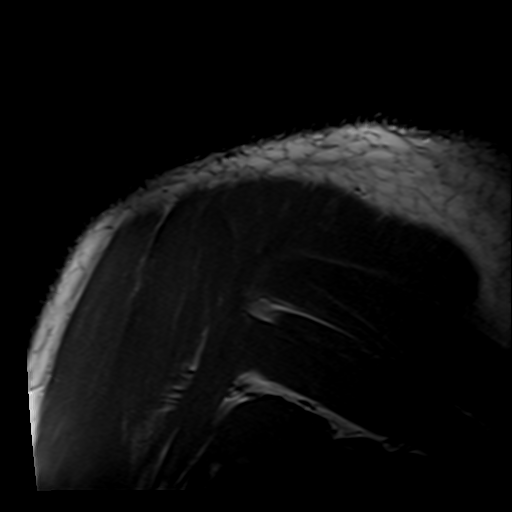

[Series 6: T2 fat-sat · oblique · 4.0mm · 0.66mm/px · 8 of 27 slices shown (3 of 3)]
[im 1/27]
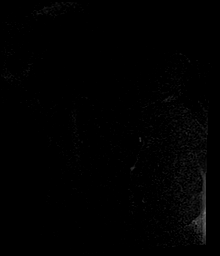
[im 4/27]
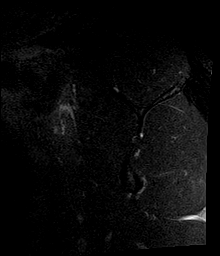
[im 8/27]
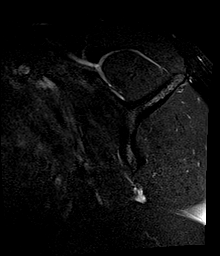
[im 12/27]
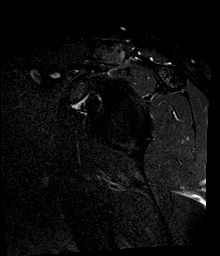
[im 15/27]
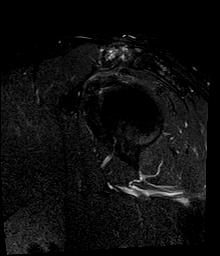
[im 19/27]
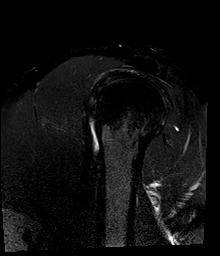
[im 23/27]
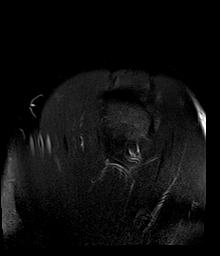
[im 27/27]
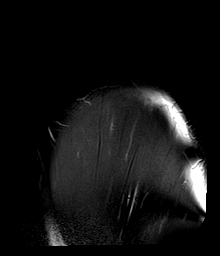

[Series 7: T1 · oblique · 4.0mm · 0.33mm/px · 5 of 28 slices shown]
[im 1/28]
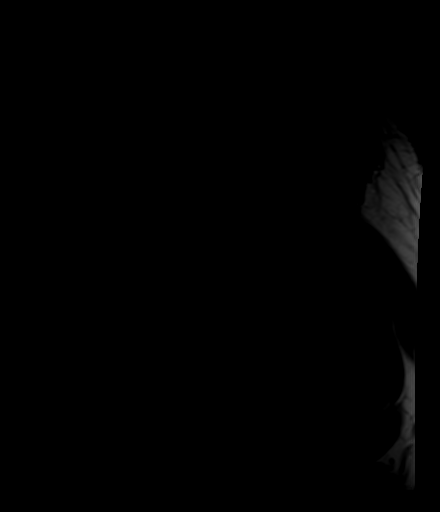
[im 4/28]
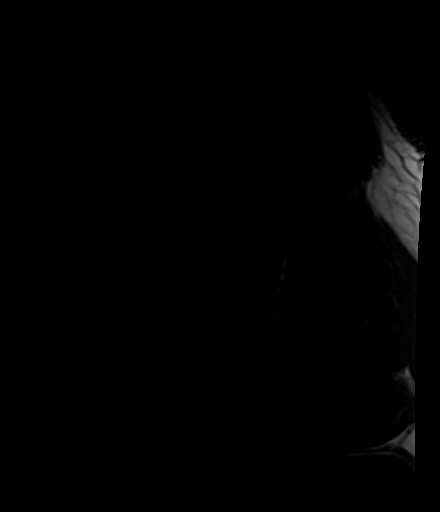
[im 8/28]
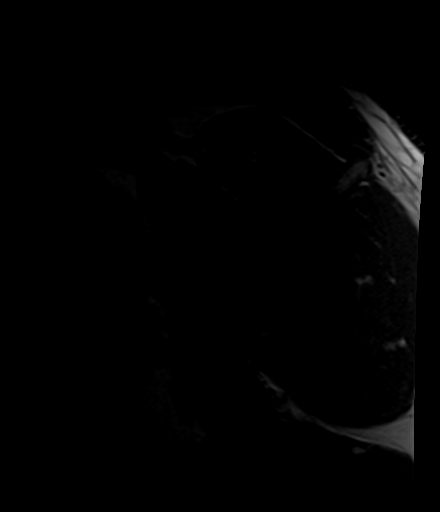
[im 12/28]
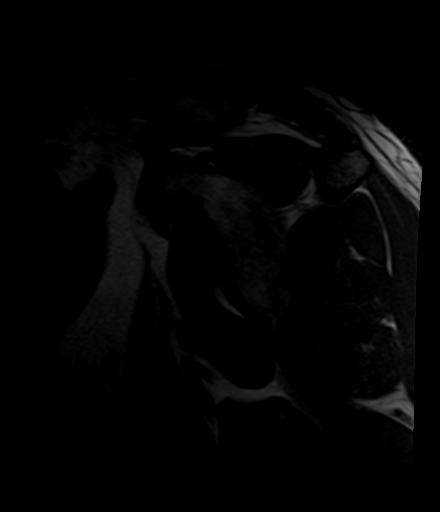
[im 16/28]
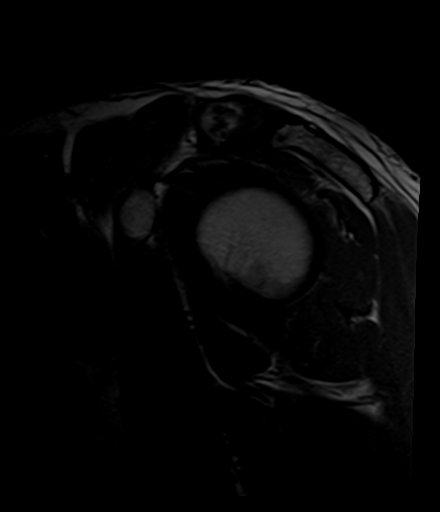

[37 of 40 positions shown; findings below may reference images not displayed]

FINDINGS: Rotator cuff: Mild supraspinatus tendinosis without tear. The
infraspinatus, subscapularis and teres minor tendons appear normal.

Muscles:  No focal muscular atrophy or edema.

Biceps long head:  Intact and normally positioned.

Acromioclavicular Joint: The acromion is type 2. There is an
incompletely fused os acromiale. Mild-to-moderate acromioclavicular
degenerative changes are present. No significant fluid is present in
the subacromial - subdeltoid bursa.

Glenohumeral Joint: Mild glenohumeral degenerative changes. No
evidence of shoulder joint effusion.

Labrum: Labral assessment is limited by the lack of joint fluid. No
evidence of labral tear or paralabral cyst.

Bones: No acute or significant extra-articular osseous findings.

Other: No significant soft tissue findings.
IMPRESSION: 1. Mild supraspinatus tendinosis. No evidence of rotator cuff tear
or focal muscular atrophy.
2. The labrum and biceps tendon appear intact.
3. Mild-to-moderate acromioclavicular degenerative changes.
Incompletely fused os acromiale without significant surrounding
marrow edema as seen in the opposite shoulder.

## 2021-04-16 ENCOUNTER — Ambulatory Visit (INDEPENDENT_AMBULATORY_CARE_PROVIDER_SITE_OTHER): Payer: Medicaid Other | Admitting: Orthopaedic Surgery

## 2021-04-16 ENCOUNTER — Encounter: Payer: Self-pay | Admitting: Orthopaedic Surgery

## 2021-04-16 ENCOUNTER — Other Ambulatory Visit: Payer: Self-pay

## 2021-04-16 DIAGNOSIS — M25512 Pain in left shoulder: Secondary | ICD-10-CM | POA: Diagnosis not present

## 2021-04-16 DIAGNOSIS — M25511 Pain in right shoulder: Secondary | ICD-10-CM | POA: Diagnosis not present

## 2021-04-16 DIAGNOSIS — G8929 Other chronic pain: Secondary | ICD-10-CM

## 2021-04-16 MED ORDER — MELOXICAM 15 MG PO TABS: 15.0000 mg | ORAL_TABLET | Freq: Every day | ORAL | 3 refills | Status: DC

## 2021-04-16 NOTE — Progress Notes (Signed)
The patient is an active 45 year old gentleman who comes in for follow-up after having MRI of both shoulders.  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  This work has been doing a lot of overhead activities as well.  He has had chronic pain for some time now both shoulders and had failed conservative treatment with at least 1 steroid injection on the right side.  We had not been aggressive with steroid injections given his diabetes but he does say his blood glucose is under good control.  All of his pain seems to be with overhead activities and reaching behind him with no loss of motion or strength.  His pain seems to be at the [facility] joint and subacromial outlet of both shoulders.  The MRI of both shoulders are exactly the same.  He has mild tendinosis of the rotator cuff with no tear.  He does have mild to moderate AC joint arthritic changes and edema and acromion from os acromiale on both sides.  This is classic impingement as well.  I did let him know that his overhead activities are contributing to the chronic pain he is having with his shoulders.  I would like to send him to outpatient physical therapy with any modalities that can help improve his shoulder function and decrease his pain.  We will have him on meloxicam once daily and Tylenol arthritis.  All questions and concerns were answered addressed.  I will see him back in 6 weeks after course of therapy for both shoulders.  If his blood glucose is under control we may consider an injection in 1 shoulder.

## 2021-04-29 ENCOUNTER — Other Ambulatory Visit: Payer: Self-pay | Admitting: Podiatry

## 2021-04-30 NOTE — Telephone Encounter (Signed)
Please call patient to schedule f/u appointment for medication refill.

## 2021-05-09 ENCOUNTER — Other Ambulatory Visit: Payer: Self-pay

## 2021-05-09 MED ORDER — TERBINAFINE HCL 250 MG PO TABS: 250.0000 mg | ORAL_TABLET | Freq: Every day | ORAL | 0 refills | Status: DC

## 2021-05-09 NOTE — Telephone Encounter (Signed)
He can take 60 total so can refill

## 2021-05-09 NOTE — Telephone Encounter (Signed)
Patient has been notified

## 2021-05-29 ENCOUNTER — Ambulatory Visit: Payer: Medicaid Other | Admitting: Orthopaedic Surgery

## 2021-06-04 ENCOUNTER — Other Ambulatory Visit: Payer: Self-pay

## 2021-06-04 ENCOUNTER — Ambulatory Visit: Payer: Medicaid Other | Attending: Orthopaedic Surgery

## 2021-06-04 DIAGNOSIS — G8929 Other chronic pain: Secondary | ICD-10-CM | POA: Insufficient documentation

## 2021-06-04 DIAGNOSIS — M25511 Pain in right shoulder: Secondary | ICD-10-CM | POA: Insufficient documentation

## 2021-06-04 DIAGNOSIS — M25512 Pain in left shoulder: Secondary | ICD-10-CM | POA: Insufficient documentation

## 2021-06-04 NOTE — Addendum Note (Signed)
Addended by: Eloy End on: 06/04/2021 03:51 PM   Modules accepted: Orders

## 2021-06-04 NOTE — Therapy (Signed)
OUTPATIENT PHYSICAL THERAPY SHOULDER EVALUATION   Patient Name: Christopher Abbott MRN: [id number] DOB:December 28, 1975, 46 y.o., male Today's Date: 06/04/2021   PT End of Session - 06/04/21 0753     Visit Number 1    Number of Visits 15    Date for PT Re-Evaluation 07/30/21    Authorization Type [facility] [facility]    PT Start Time 0753    PT Stop Time 0823    PT Time Calculation (min) 30 min             Past Medical History:  Diagnosis Date   Diabetes mellitus    type 2   GERD (gastroesophageal reflux disease)    Hypertension    Past Surgical History:  Procedure Laterality Date   ACHILLES TENDON SURGERY Left 07/18/2015   Procedure: LEFT ACHILLES TENDON DIRECT PRIMARY REPAIR;  Surgeon: Kathryne Hitch, MD;  Location: [facility] OR;  Service: Orthopedics;  Laterality: Left;   COLONOSCOPY WITH PROPOFOL N/A 08/14/2017   Procedure: COLONOSCOPY WITH PROPOFOL;  Surgeon: Toney Reil, MD;  Location: [facility] ENDOSCOPY;  Service: Gastroenterology;  Laterality: N/A;   NO PAST SURGERIES     Patient Active Problem List   Diagnosis Date Noted   Internal hemorrhoid, bleeding 08/29/2017   Type 2 diabetes mellitus (HCC) 08/06/2017   Achilles rupture, left 07/18/2015   Rupture of left Achilles tendon 07/18/2015    PCP: Ulanda Edison, MD  REFERRING PROVIDER: Kathryne Hitch*  REFERRING DIAG: M25.511,G89.29,M25.512 (ICD-10-CM) - Chronic pain of both shoulders  THERAPY DIAG:  Chronic pain of both shoulders   ONSET DATE: Chronic; no MOI  SUBJECTIVE:                                                                                                                                                                                      SUBJECTIVE STATEMENT: Pt presents to PT with reports of chronic bilateral shoulder pain and discomfort. Works in Editor, commissioning and notes that certain movements seem to increase in pain. Is RHD and pain started when he began lifting weights a few years ago.  Notes occasional numbness if he sleeps on his arm. He notes that the R may be a little worse, but they are roughly about the same. Notes pain hasn't been too bad the last few weeks.   PERTINENT HISTORY: Chronic bilateral shoulder pain, especially with overhead motions  PAIN:  Are you having pain? No Numeric Pain scale: 0/10 (3/10 at worst) Pain location: bilateral shoulder PAIN TYPE: aching and numbness Pain description: intermittent  Aggravating factors: certain lifting motions Relieving factors: medication  PRECAUTIONS: None  WEIGHT BEARING RESTRICTIONS No  FALLS:  Has patient  fallen in last 6 months? 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 No relief from previous shoulder injection. No acute injury or prior relevant surgery.   EXAM: MRI OF THE RIGHT SHOULDER WITHOUT CONTRAST   TECHNIQUE: Multiplanar, multisequence MR imaging of the shoulder was performed. No intravenous contrast was administered.   COMPARISON:  Radiographs 02/15/2020   FINDINGS: Rotator cuff: Mild supraspinatus tendinosis without tear. The infraspinatus, subscapularis and teres minor tendons appear normal.   Muscles:  No focal muscular atrophy or edema.   Biceps long head:  Intact and normally positioned.   Acromioclavicular Joint: The acromion is type 2. 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 Mild-to-moderate acromioclavicular degenerative changes are present. No significant fluid is present in the subacromial - subdeltoid bursa.   Glenohumeral Joint: Mild glenohumeral  degenerative changes. No evidence of shoulder joint effusion.   Labrum: Labral assessment is limited by the lack of joint fluid. No evidence of labral tear or paralabral cyst.   Bones: No acute or significant extra-articular osseous findings.   Other: No significant soft tissue findings.   IMPRESSION: 1. Mild supraspinatus tendinosis. No evidence of rotator cuff tear or focal muscular atrophy. 2. The labrum and biceps tendon appear intact. 3. Mild-to-moderate acromioclavicular degenerative changes. Incompletely fused os acromiale without significant surrounding marrow edema as seen in the opposite shoulder.  CLINICAL DATA:  Bilateral shoulder pain for 6 months. No improvement following shoulder injection. No acute injury or prior relevant surgery.   EXAM: MRI OF THE LEFT SHOULDER WITHOUT CONTRAST   TECHNIQUE: Multiplanar, multisequence MR imaging of the shoulder was performed. No intravenous contrast was administered.   COMPARISON:  Radiographs 02/15/2020.   FINDINGS: Rotator cuff: Mild supraspinatus and infraspinatus tendinosis without evidence of tear. The subscapularis and teres minor tendons appear normal.   Muscles:  No focal muscular atrophy or edema.   Biceps long head:  Intact and normally positioned.   Acromioclavicular Joint: The acromion is type 2. 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 There are mild acromioclavicular degenerative changes. A small amount of fluid is present in the subacromial-subdeltoid bursa.   Glenohumeral Joint: Mild glenohumeral degenerative changes. No significant shoulder joint effusion.   Labrum: Labral assessment limited by the lack of joint fluid. No evidence of labral tear or paralabral cyst.   Bones: No acute or significant extra-articular osseous findings.   Other: No significant soft tissue findings.   IMPRESSION: 1. Mild  supraspinatus and infraspinatus tendinosis. No evidence of rotator cuff tear. 2. The labrum and biceps tendon appear intact. 3. Unfused os acromiale with prominent reactive edema posteriorly in the acromion adjacent to the synchondrosis which may contribute to shoulder pain and rotator cuff impingement.    PATIENT SURVEYS:  Quick Dash 20% disability  COGNITION:  Overall cognitive status: Within functional limits for tasks assessed     SENSATION:  Light touch: Appears intact  Stereognosis: Appears intact  Hot/Cold: Appears intact  Proprioception: Appears intact  UPPER EXTREMITY AROM/PROM:   WNL all bilateral UE motions  UPPER EXTREMITY MMT:  MMT Right  06/04/2021 Left 06/04/2021  Shoulder flexion 5/5 5/5  Shoulder abduction (C5) 5/5 4/5  Shoulder ER 4/5 4/5  Shoulder IR 5/5 4/5  Middle trapezius    Lower trapezius    Shoulder extension    Grip strength    Cervical flexion (C1,C2)    Cervical S/B (C3)    Shoulder shrug (C4)    Elbow flexion (C6)    Elbow ext (C7)    Thumb ext (C8)    Finger abd (T1)     (Blank rows = not tested, score listed is out of 5 possible points.  N = WNL, D = diminished, C = clear for gross weakness with myotome testing, * = concordant pain with testing)   SHOULDER SPECIAL TESTS:  Impingement tests: Neer impingement test: negative and Hawkins/Kennedy impingement test: negative  SLAP lesions:  N/A  Instability tests:  N/A  Rotator cuff assessment: Drop arm test: negative and Empty can test: negative  Biceps assessment: N/A  JOINT MOBILITY TESTING:  N/A  PALPATION:  TTP to bilateral subscapularis muscle insertion, AC joint  POSTURE:  Medium body habitus; rounded shoulders   TODAY'S TREATMENT:  Therapeutic Exercise: Row x 15 Black TB Corner stretch x 30" Bilateral ER w/ scap retrac x 10 RTB L shoulder IR/ER iso x 5 - 5"   PATIENT EDUCATION: Education details: eval findings, quick DASH, HEP, POC Person educated: Patient Education  method: Explanation, Demonstration, and Handouts Education comprehension: verbalized understanding and returned demonstration   HOME EXERCISE PROGRAM: Access Code: [email or web address]  ASSESSMENT:  CLINICAL IMPRESSION: Pt is a 46 y/o M who presents to PT with reports of chronic bilateral shoulder pain and discomfort. Physical findings are consistent with MD impression, as pt demonstrates decreased RTC strength and point tenderness to bilateral subscapularis muscles. His Quick DASH score indicates minimal disability and demonstrates pt is operating below PLOF. He would benefit from skilled PT services working on improving RTC and periscapular strength and improving shoulder biomechanics in order to decrease pain and improve funciton.   REHAB POTENTIAL: Excellent  CLINICAL DECISION MAKING: Stable/uncomplicated  EVALUATION COMPLEXITY: Low   GOALS: Goals reviewed with patient? No  SHORT TERM GOALS:  STG Name Target Date Goal status  1 Pt will be compliant and knowledgeable with initial HEP for improved comfort and carryover Baseline: initial HEP given 06/25/2021 INITIAL   LONG TERM GOALS:   LTG Name Target Date Goal status  1 Pt will decrease Quick DASH disability score to no greater than 5% as proxy for functional improvement Baseline: 20% disability 07/30/2021 INITIAL  2 Pt will self report bilateral shoulder pain no greater than 0/10 for improved comfort and functional ability Baseline: 3/10 at worst 07/30/2021 INITIAL  3 Pt will be able to lift 25lb KB overhead with no increase in bilateral shoulder pain in order to improve comfort with work related duties Baseline: unable 07/30/2021 INITIAL  4 Pt will be knowledgeable of advanced HEP for continued improvement post d/c from skilled PT Baseline: 07/30/2021 INITIAL   PLAN: PT FREQUENCY: 1-2x/week  PT DURATION: 8 weeks  PLANNED INTERVENTIONS: Therapeutic exercises, Therapeutic activity, Neuro Muscular re-education, Balance training, Gait  training, Patient/Family education, Joint mobilization, Dry Needling, Spinal mobilization, Cryotherapy, Moist heat, and Manual therapy  PLAN FOR NEXT SESSION: assess HEP response, progress RTC and periscapular strengthening as able   Christopher Abbott 06/04/2021, 9:21 AM

## 2021-06-11 ENCOUNTER — Encounter: Payer: Self-pay | Admitting: Physical Therapy

## 2021-06-11 ENCOUNTER — Ambulatory Visit: Payer: Medicaid Other | Admitting: Physical Therapy

## 2021-06-11 ENCOUNTER — Other Ambulatory Visit: Payer: Self-pay

## 2021-06-11 DIAGNOSIS — G8929 Other chronic pain: Secondary | ICD-10-CM

## 2021-06-11 DIAGNOSIS — M25511 Pain in right shoulder: Secondary | ICD-10-CM

## 2021-06-11 NOTE — Therapy (Addendum)
OUTPATIENT PHYSICAL THERAPY TREATMENT NOTE   Patient Name: Christopher Abbott MRN: [id number] DOB:1975-11-09, 46 y.o., male Today's Date: 06/11/2021  PCP: Ulanda Edison, MD REFERRING PROVIDER: Ulanda Edison, MD   PT End of Session - 06/11/21 0800     Visit Number 2    Number of Visits 15    Date for PT Re-Evaluation 07/30/21    Authorization Type [facility] [facility]    PT Start Time 0755    PT Stop Time 0833    PT Time Calculation (min) 38 min             Past Medical History:  Diagnosis Date   Diabetes mellitus    type 2   GERD (gastroesophageal reflux disease)    Hypertension    Past Surgical History:  Procedure Laterality Date   ACHILLES TENDON SURGERY Left 07/18/2015   Procedure: LEFT ACHILLES TENDON DIRECT PRIMARY REPAIR;  Surgeon: Kathryne Hitch, MD;  Location: [facility] OR;  Service: Orthopedics;  Laterality: Left;   COLONOSCOPY WITH PROPOFOL N/A 08/14/2017   Procedure: COLONOSCOPY WITH PROPOFOL;  Surgeon: Toney Reil, MD;  Location: [facility] ENDOSCOPY;  Service: Gastroenterology;  Laterality: N/A;   NO PAST SURGERIES     Patient Active Problem List   Diagnosis Date Noted   Internal hemorrhoid, bleeding 08/29/2017   Type 2 diabetes mellitus (HCC) 08/06/2017   Achilles rupture, left 07/18/2015   Rupture of left Achilles tendon 07/18/2015  Referring Provider: Kathryne Hitch   REFERRING DIAG: M25.511,G89.29,M25.512 (ICD-10-CM) - Chronic pain of both shoulders  THERAPY DIAG:  Chronic pain of both shoulders  PERTINENT HISTORY: Chronic bilateral shoulder pain, especially with overhead motions  PRECAUTIONS: None   WEIGHT BEARING RESTRICTIONS No  SUBJECTIVE: No pain now. It usually hurts when I am working.   PAIN:  Are you having pain? No Numeric Pain scale: 0/10  Pain location: bilateral shoulder PAIN TYPE: aching and numbness Pain description: intermittent  Aggravating factors: certain lifting motions Relieving factors:  medication     OBJECTIVE:    DIAGNOSTIC FINDINGS:  CLINICAL DATA:  Bilateral shoulder pain for 6 months. No relief from previous shoulder injection. No acute injury or prior relevant surgery.   EXAM: MRI OF THE RIGHT SHOULDER WITHOUT CONTRAST   TECHNIQUE: Multiplanar, multisequence MR imaging of the shoulder was performed. No intravenous contrast was administered.   COMPARISON:  Radiographs 02/15/2020   FINDINGS: Rotator cuff: Mild supraspinatus tendinosis without tear. The infraspinatus, subscapularis and teres minor tendons appear normal.   Muscles:  No focal muscular atrophy or edema.   Biceps long head:  Intact and normally positioned.   Acromioclavicular Joint: The acromion is type 2. 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 Mild-to-moderate acromioclavicular degenerative changes are present. No significant fluid is present in the subacromial - subdeltoid bursa.   Glenohumeral Joint: Mild glenohumeral degenerative changes. No evidence of shoulder joint effusion.   Labrum: Labral assessment is limited by the lack of joint fluid. No evidence of labral tear or paralabral cyst.   Bones: No acute or significant extra-articular osseous findings.   Other: No significant soft tissue findings.   IMPRESSION: 1. Mild supraspinatus tendinosis. No evidence of rotator cuff tear or focal muscular atrophy. 2. The labrum and biceps tendon appear intact. 3. Mild-to-moderate acromioclavicular degenerative changes. Incompletely fused os acromiale without significant surrounding marrow edema as seen in the opposite shoulder.   CLINICAL DATA:  Bilateral shoulder pain for 6 months. No improvement following shoulder injection. No acute injury or prior relevant surgery.  EXAM: MRI OF THE LEFT SHOULDER WITHOUT CONTRAST   TECHNIQUE: Multiplanar, multisequence MR imaging of the shoulder was performed. No intravenous contrast was administered.   COMPARISON:  Radiographs  02/15/2020.   FINDINGS: Rotator cuff: Mild supraspinatus and infraspinatus tendinosis without evidence of tear. The subscapularis and teres minor tendons appear normal.   Muscles:  No focal muscular atrophy or edema.   Biceps long head:  Intact and normally positioned.   Acromioclavicular Joint: The acromion is type 2. 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 There are mild acromioclavicular degenerative changes. A small amount of fluid is present in the subacromial-subdeltoid bursa.   Glenohumeral Joint: Mild glenohumeral degenerative changes. No significant shoulder joint effusion.   Labrum: Labral assessment limited by the lack of joint fluid. No evidence of labral tear or paralabral cyst.   Bones: No acute or significant extra-articular osseous findings.   Other: No significant soft tissue findings.   IMPRESSION: 1. Mild supraspinatus and infraspinatus tendinosis. No evidence of rotator cuff tear. 2. The labrum and biceps tendon appear intact. 3. Unfused os acromiale with prominent reactive edema posteriorly in the acromion adjacent to the synchondrosis which may contribute to shoulder pain and rotator cuff impingement.      PATIENT SURVEYS:  Quick Dash 20% disability   COGNITION:          Overall cognitive status: Within functional limits for tasks assessed                               SENSATION:          Light touch: Appears intact          Stereognosis: Appears intact          Hot/Cold: Appears intact          Proprioception: Appears intact   UPPER EXTREMITY AROM/PROM:                     WNL all bilateral UE motions   UPPER EXTREMITY MMT:   MMT Right 06/04/2021 Left 06/04/2021  Shoulder flexion 5/5 5/5  Shoulder abduction (C5) 5/5 4/5  Shoulder ER 4/5 4/5  Shoulder IR 5/5 4/5  Middle trapezius      Lower trapezius      Shoulder extension      Grip strength       Cervical flexion (C1,C2)      Cervical S/B (C3)      Shoulder shrug (C4)      Elbow flexion (C6)      Elbow ext (C7)      Thumb ext (C8)      Finger abd (T1)        (Blank rows = not tested, score listed is out of 5 possible points.  N = WNL, D = diminished, C = clear for gross weakness with myotome testing, * = concordant pain with testing)    SHOULDER SPECIAL TESTS:           Impingement tests: Neer impingement test: negative and Hawkins/Kennedy impingement test: negative           SLAP lesions:  N/A           Instability tests:  N/A           Rotator cuff assessment: Drop arm test: negative and Empty can test: negative  Biceps assessment: N/A   JOINT MOBILITY TESTING:  N/A   PALPATION:  TTP to bilateral subscapularis muscle insertion, AC joint   POSTURE:  Medium body habitus; rounded shoulders            TODAY'S TREATMENT:06/11/21  Therapeutic Exercise: Row x 26# 20 x 3 (free motion)  Shoulder extension 20#  20 x 3  Corner stretch x 30" Bilateral ER w/ scap retrac 20 x 3  BTB  shoulder IR iso x 10 x 5 sec , 2 sets bilat Shoulder IR green band 20 x 3 bilat  Supine horizontal abduction x 20 green  Supine star pattern green band x 20   Initial TREATMENT:  Therapeutic Exercise: Row x 15 Black TB Corner stretch 3 x 30 Sec  Bilateral ER w/ scap retrac x 10 RTB L shoulder IR/ER iso x 5 - 5"     PATIENT EDUCATION: Education details: updated HEP Person educated: Patient Education method: Explanation, Demonstration, and Handouts Education comprehension: verbalized understanding and returned demonstration     HOME EXERCISE PROGRAM: Access Code: [email or web address]   ASSESSMENT:   CLINICAL IMPRESSION: Pt reports compliance with HEP and no recent pain. Session spent with review of HEP and progression of RTC and scap strength. He tolerated the session well with reports of muscle burning, no increased pain.  He would continue to benefit from skilled PT services  working on improving RTC and periscapular strength and improving shoulder biomechanics in order to decrease pain and improve funciton.    REHAB POTENTIAL: Excellent   CLINICAL DECISION MAKING: Stable/uncomplicated   EVALUATION COMPLEXITY: Low     GOALS: Goals reviewed with patient? No   SHORT TERM GOALS:   STG Name Target Date Goal status  1 Pt will be compliant and knowledgeable with initial HEP for improved comfort and carryover Baseline: initial HEP given 06/25/2021 INITIAL    LONG TERM GOALS:    LTG Name Target Date Goal status  1 Pt will decrease Quick DASH disability score to no greater than 5% as proxy for functional improvement Baseline: 20% disability 07/30/2021 INITIAL  2 Pt will self report bilateral shoulder pain no greater than 0/10 for improved comfort and functional ability Baseline: 3/10 at worst 07/30/2021 INITIAL  3 Pt will be able to lift 25lb KB overhead with no increase in bilateral shoulder pain in order to improve comfort with work related duties Baseline: unable 07/30/2021 INITIAL  4 Pt will be knowledgeable of advanced HEP for continued improvement post d/c from skilled PT Baseline: 07/30/2021 INITIAL    PLAN: PT FREQUENCY: 1-2x/week   PT DURATION: 8 weeks   PLANNED INTERVENTIONS: Therapeutic exercises, Therapeutic activity, Neuro Muscular re-education, Balance training, Gait training, Patient/Family education, Joint mobilization, Dry Needling, Spinal mobilization, Cryotherapy, Moist heat, and Manual therapy   PLAN FOR NEXT SESSION: assess HEP response, progress RTC and periscapular strengthening as able      Jannette Spanner, PTA 06/11/21 8:40 AM Phone: [phone] Fax: [phone]

## 2021-06-12 ENCOUNTER — Other Ambulatory Visit: Payer: Self-pay

## 2021-06-12 ENCOUNTER — Ambulatory Visit (HOSPITAL_COMMUNITY)
Admission: EM | Admit: 2021-06-12 | Discharge: 2021-06-12 | Disposition: A | Discharge status: Home / Self Care | Payer: Medicaid Other | Attending: Urgent Care | Admitting: Urgent Care

## 2021-06-12 ENCOUNTER — Encounter (HOSPITAL_COMMUNITY): Payer: Self-pay

## 2021-06-12 DIAGNOSIS — R0789 Other chest pain: Secondary | ICD-10-CM | POA: Diagnosis not present

## 2021-06-12 DIAGNOSIS — E1159 Type 2 diabetes mellitus with other circulatory complications: Secondary | ICD-10-CM | POA: Diagnosis not present

## 2021-06-12 DIAGNOSIS — I10 Essential (primary) hypertension: Secondary | ICD-10-CM

## 2021-06-12 DIAGNOSIS — F17200 Nicotine dependence, unspecified, uncomplicated: Secondary | ICD-10-CM

## 2021-06-12 DIAGNOSIS — J3089 Other allergic rhinitis: Secondary | ICD-10-CM

## 2021-06-12 DIAGNOSIS — E119 Type 2 diabetes mellitus without complications: Secondary | ICD-10-CM

## 2021-06-12 DIAGNOSIS — Z794 Long term (current) use of insulin: Secondary | ICD-10-CM

## 2021-06-12 MED ORDER — LEVOCETIRIZINE DIHYDROCHLORIDE 5 MG PO TABS
5.0000 mg | ORAL_TABLET | Freq: Every evening | ORAL | 0 refills | Status: DC
Start: 2021-06-12 — End: 2023-05-12

## 2021-06-12 MED ORDER — TIZANIDINE HCL 4 MG PO TABS
4.0000 mg | ORAL_TABLET | Freq: Every day | ORAL | 0 refills | Status: DC
Start: 2021-06-12 — End: 2023-05-12

## 2021-06-12 MED ORDER — PROMETHAZINE-DM 6.25-15 MG/5ML PO SYRP: 5.0000 mL | ORAL_SOLUTION | Freq: Four times a day (QID) | ORAL | 0 refills | Status: DC | PRN

## 2021-06-12 MED ORDER — BENZONATATE 100 MG PO CAPS: 100.0000 mg | ORAL_CAPSULE | Freq: Three times a day (TID) | ORAL | 0 refills | Status: DC | PRN

## 2021-06-12 MED ORDER — ACETAMINOPHEN 325 MG PO TABS: 650.0000 mg | ORAL_TABLET | Freq: Four times a day (QID) | ORAL | 0 refills | Status: DC | PRN

## 2021-06-12 NOTE — ED Triage Notes (Signed)
Pt presents with left side Chest tightness x1 month, pt states tightness is constant.

## 2021-06-12 NOTE — ED Provider Notes (Signed)
[place]   MRN: [id number] DOB: [birth year]  Subjective:   Christopher Abbott is a 46 y.o. male presenting for longstanding history of intermittent transient mid to left-sided chest pains since his early adulthood.  This particular episode has been going on for the past month.  Has multiple episodes that lasts several seconds and resolve on their own.  Denies cough, shortness of breath, wheezing, nausea, vomiting, diaphoresis, neck or jaw pain, radiation of the pain into the left arm or back.  No history of heart disease, MI.  He is a type II diabetic treated with insulin.  Also has a history of hypertension and acid reflux.  Has never had to see a heart doctor.  He would also like help with a persistent cough, scratchy throat.  States that he has this all the time and usually gets cough medicine to help him.  He is requesting specifically hydrocodone.  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  Does not do heavy lifting.  No current facility-administered medications for this encounter.  Current Outpatient Medications:    ACCU-CHEK FASTCLIX LANCETS MISC, USE AS DIRECTED TO CHECK BLOOD SUAGR DX: E11.9, Disp: , Rfl: 11   ACCU-CHEK GUIDE test strip, USE AS DIRECTED TO TEST BLOOD SUGAR TWICE A DAY DX:E11.9, Disp: , Rfl: 11   B-D ULTRAFINE III SHORT PEN 31G X 8 MM MISC, USE WITH INSULIN PEN INJECTIONS DAILY ( DX: E11.9), Disp: , Rfl: 11   benzonatate (TESSALON PERLES) 100 MG capsule, Take 2 capsules (200 mg total) by mouth 3 (three) times daily as needed for cough., Disp: 30 capsule, Rfl: 0   Blood Glucose Monitoring Suppl (ACCU-CHEK GUIDE) w/Device KIT, USE AS DIRECTED DX E11.9 (MEDICAID PREFERRED), Disp: , Rfl: 0   esomeprazole (NEXIUM) 20 MG capsule, Take 20 mg by mouth daily at 12 noon., Disp: , Rfl:    fluticasone (FLONASE) 50 MCG/ACT nasal spray, Place 2 sprays into both nostrils daily., Disp: 16 g, Rfl: 0   HYDROcodone-acetaminophen  (NORCO/VICODIN) 5-325 MG tablet, Take 2 tablets by mouth every 4 (four) hours as needed., Disp: 10 tablet, Rfl: 0   hydrocortisone (ANUSOL-HC) 2.5 % rectal cream, Apply rectally 2 times daily, Disp: 28 g, Rfl: 2   ibuprofen (ADVIL,MOTRIN) 600 MG tablet, Take 1 tablet (600 mg total) by mouth every 6 (six) hours as needed., Disp: 30 tablet, Rfl: 0   insulin glargine (LANTUS) 100 UNIT/ML injection, Inject 70 Units into the skin daily. , Disp: , Rfl:    JANUMET XR [phone] MG TB24, Take 1 tablet by mouth daily., Disp: , Rfl: 3   LANTUS SOLOSTAR 100 UNIT/ML Solostar Pen, INJECT 45 UNITS INTO THE SKIN 4 TIMES A DAY, Disp: , Rfl: 3   Liraglutide (VICTOZA) 18 MG/3ML SOPN, Inject 1.8 mg into the skin daily., Disp: , Rfl:    lisinopril-hydrochlorothiazide (PRINZIDE,ZESTORETIC) 20-12.5 MG tablet, Take 1 tablet by mouth daily. for high blood pressure, Disp: , Rfl: 5   meloxicam (MOBIC) 15 MG tablet, Take 1 tablet (15 mg total) by mouth daily., Disp: 30 tablet, Rfl: 3   metFORMIN (GLUCOPHAGE) 1000 MG tablet, Take 1,000 mg by mouth 2 (two) times daily with a meal., Disp: , Rfl:    pravastatin (PRAVACHOL) 40 MG tablet, Take 40 mg by mouth every evening. for cholesterol, Disp: , Rfl: 1   promethazine-dextromethorphan (PROMETHAZINE-DM) 6.25-15 MG/5ML syrup, Take 5 mLs by mouth 4 (four) times daily as needed for cough., Disp: 240  mL, Rfl: 0   SUPREP BOWEL PREP KIT 17.5-3.13-1.6 GM/177ML SOLN, See admin instructions., Disp: , Rfl: 0   terbinafine (LAMISIL) 250 MG tablet, Take 1 tablet (250 mg total) by mouth daily., Disp: 45 tablet, Rfl: 0   Vitamin D, Ergocalciferol, (DRISDOL) 50000 units CAPS capsule, TAKE ONE CAPSULE BY MOUTH ONCE A WEEK FOR 12 WEEKS, Disp: , Rfl: 3   No Known Allergies  Past Medical History:  Diagnosis Date   Diabetes mellitus    type 2   GERD (gastroesophageal reflux disease)    Hypertension      Past Surgical History:  Procedure Laterality Date   ACHILLES TENDON SURGERY Left 07/18/2015    Procedure: LEFT ACHILLES TENDON DIRECT PRIMARY REPAIR;  Surgeon: Mcarthur Rossetti, MD;  Location: [place];  Service: Orthopedics;  Laterality: Left;   COLONOSCOPY WITH PROPOFOL N/A 08/14/2017   Procedure: COLONOSCOPY WITH PROPOFOL;  Surgeon: Lin Landsman, MD;  Location: [facility] ENDOSCOPY;  Service: Gastroenterology;  Laterality: N/A;   NO PAST SURGERIES      Family History  Problem Relation Age of Onset   Diabetes Mother    Diabetes Father     Social History   Tobacco Use   Smoking status: Some Days    Packs/day: 0.20    Types: Cigarettes   Smokeless tobacco: Never  Substance Use Topics   Alcohol use: Not Currently    Comment: none now, once per week or less   Drug use: No    ROS   Objective:   Vitals: BP 135/90 (BP Location: Left Arm)    Pulse 97    Temp 98.5 F (36.9 C) (Oral)    Resp 14    SpO2 98%   Physical Exam Constitutional:      General: He is not in acute distress.    Appearance: Normal appearance. He is well-developed. He is not ill-appearing, toxic-appearing or diaphoretic.  HENT:     Head: Normocephalic and atraumatic.     Right Ear: External ear normal.     Left Ear: External ear normal.     Nose: Nose normal.     Mouth/Throat:     Mouth: Mucous membranes are moist.  Eyes:     General: No scleral icterus.       Right eye: No discharge.        Left eye: No discharge.     Extraocular Movements: Extraocular movements intact.  Cardiovascular:     Rate and Rhythm: Normal rate and regular rhythm.     Heart sounds: Normal heart sounds. No murmur heard.   No friction rub. No gallop.  Pulmonary:     Effort: Pulmonary effort is normal. No respiratory distress.     Breath sounds: Normal breath sounds. No stridor. No wheezing, rhonchi or rales.  Chest:     Chest wall: Tenderness present.    Neurological:     Mental Status: He is alert and oriented to person, place, and time.  Psychiatric:        Mood and Affect: Mood normal.         Behavior: Behavior normal.        Thought Content: Thought content normal.   ED ECG REPORT   Date: 06/12/2021  Rate: 94 BPM  Rhythm: normal sinus rhythm  QRS Axis: normal  Intervals: normal  ST/T Wave abnormalities: nonspecific T wave changes  Conduction Disutrbances:none  Narrative Interpretation: Sinus rhythm at 94 bpm with nonspecific T wave flattening in lead III, aVL, aVF,  T wave inversion in lateral leads V4 through V6.  Very comparable to previous EKG and slightly improved.  Old EKG Reviewed: changes noted  I have personally reviewed the EKG tracing and agree with the computerized printout as noted.  Assessment and Plan :   PDMP not reviewed this encounter.  1. Atypical chest pain   2. Smoker   3. Type 2 diabetes mellitus treated with insulin ([place])   4. Essential hypertension    Patient has unclear nature of longstanding history of chest pain.  In the clinic he did not have any particular chest pain.  He had mildly reproducible chest pain over the area outlined.  Recommended managing this for musculoskeletal chest wall pain.  He does have some risk factors in having diabetes and high blood pressure, being a light smoker.  Therefore, recommended that he pursue a consultation with [place] [facility].  At this time I have low suspicion for ACS given his EKG.  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  Use supportive care otherwise. Deferred imaging given clear cardiopulmonary exam, hemodynamically stable vital signs.  Patient declined a COVID test. Counseled patient on potential for adverse effects with medications prescribed/recommended today, ER and return-to-clinic precautions discussed, patient verbalized understanding.    Jaynee Eagles, [place] 06/12/21 [phone]

## 2021-06-14 ENCOUNTER — Ambulatory Visit: Payer: Medicaid Other | Admitting: Physical Therapy

## 2021-06-14 ENCOUNTER — Encounter: Payer: Self-pay | Admitting: Physical Therapy

## 2021-06-14 ENCOUNTER — Other Ambulatory Visit: Payer: Self-pay

## 2021-06-14 DIAGNOSIS — G8929 Other chronic pain: Secondary | ICD-10-CM

## 2021-06-14 DIAGNOSIS — M25511 Pain in right shoulder: Secondary | ICD-10-CM | POA: Diagnosis not present

## 2021-06-14 DIAGNOSIS — M25512 Pain in left shoulder: Secondary | ICD-10-CM

## 2021-06-14 NOTE — Therapy (Signed)
OUTPATIENT PHYSICAL THERAPY TREATMENT NOTE   Patient Name: Christopher AmabileQuinton Abbott MRN: [id number] DOB:01/27/1976, 46 y.o., male Today's Date: 06/14/2021  PCP: Christopher Abbott, Emma, MD REFERRING PROVIDER: Kathryne Abbott, Christopher Y*   PT End of Session - 06/14/21 0803     Visit Number 3    Number of Visits 15    Date for PT Re-Evaluation 07/30/21    Authorization Type [facility]    PT Start Time 0758    PT Stop Time 0836    PT Time Calculation (min) 38 min             Past Medical History:  Diagnosis Date   Diabetes mellitus    type 2   GERD (gastroesophageal reflux disease)    Hypertension    Past Surgical History:  Procedure Laterality Date   ACHILLES TENDON SURGERY Left 07/18/2015   Procedure: LEFT ACHILLES TENDON DIRECT PRIMARY REPAIR;  Surgeon: Christopher Hitchhristopher Y Blackman, MD;  Location: [facility];  Service: Orthopedics;  Laterality: Left;   COLONOSCOPY WITH PROPOFOL N/A 08/14/2017   Procedure: COLONOSCOPY WITH PROPOFOL;  Surgeon: Christopher Abbott, Christopher Reddy, MD;  [facility];  Service: Gastroenterology;  Laterality: N/A;   NO PAST SURGERIES     Patient Active Problem List   Diagnosis Date Noted   Internal hemorrhoid, bleeding 08/29/2017   Type 2 diabetes mellitus (HCC) 08/06/2017   Achilles rupture, left 07/18/2015   Rupture of left Achilles tendon 07/18/2015  Referring Provider: Kathryne Abbott, Christopher Y   REFERRING DIAG: M25.511,G89.29,M25.512 (ICD-10-CM) - Chronic pain of both shoulders  THERAPY DIAG: Chronic Pain of both shoulders    PERTINENT HISTORY: Chronic bilateral shoulder pain, especially with overhead motions  PRECAUTIONS: None   WEIGHT BEARING RESTRICTIONS No  SUBJECTIVE: I have not really done anything. 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 No pain.  My shoulders were tired after last session but no pain.   PAIN:  Are you having pain? No Numeric Pain scale: 0/10  Pain location: bilateral shoulder PAIN TYPE: aching and numbness Pain description:  intermittent  Aggravating factors: certain lifting motions Relieving factors: medication     OBJECTIVE:    DIAGNOSTIC FINDINGS:  CLINICAL DATA:  Bilateral shoulder pain for 6 months. No relief from previous shoulder injection. No acute injury or prior relevant surgery.   EXAM: MRI OF THE RIGHT SHOULDER WITHOUT CONTRAST   TECHNIQUE: Multiplanar, multisequence MR imaging of the shoulder was performed. No intravenous contrast was administered.   COMPARISON:  Radiographs 02/15/2020   FINDINGS: Rotator cuff: Mild supraspinatus tendinosis without tear. The infraspinatus, subscapularis and teres minor tendons appear normal.   Muscles:  No focal muscular atrophy or edema.   Biceps long head:  Intact and normally positioned.   Acromioclavicular Joint: The acromion is type 2. 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 Mild-to-moderate acromioclavicular degenerative changes are present. No significant fluid is present in the subacromial - subdeltoid bursa.   Glenohumeral Joint: Mild glenohumeral degenerative changes. No evidence of shoulder joint effusion.   Labrum: Labral assessment is limited by the lack of joint fluid. No evidence of labral tear or paralabral cyst.   Bones: No acute or significant extra-articular osseous findings.   Other: No significant soft tissue findings.   IMPRESSION: 1. Mild supraspinatus tendinosis. No evidence of rotator cuff tear or focal muscular atrophy. 2. The labrum and biceps tendon appear intact. 3. Mild-to-moderate acromioclavicular degenerative changes. Incompletely fused os acromiale without significant surrounding marrow edema as seen in the opposite shoulder.   CLINICAL DATA:  Bilateral  shoulder pain for 6 months. No improvement following shoulder injection. No acute injury or prior relevant surgery.   EXAM: MRI OF THE LEFT SHOULDER WITHOUT CONTRAST   TECHNIQUE: Multiplanar, multisequence MR imaging of the shoulder was  performed. No intravenous contrast was administered.   COMPARISON:  Radiographs 02/15/2020.   FINDINGS: Rotator cuff: Mild supraspinatus and infraspinatus tendinosis without evidence of tear. The subscapularis and teres minor tendons appear normal.   Muscles:  No focal muscular atrophy or edema.   Biceps long head:  Intact and normally positioned.   Acromioclavicular Joint: The acromion is type 2. 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 There are mild acromioclavicular degenerative changes. A small amount of fluid is present in the subacromial-subdeltoid bursa.   Glenohumeral Joint: Mild glenohumeral degenerative changes. No significant shoulder joint effusion.   Labrum: Labral assessment limited by the lack of joint fluid. No evidence of labral tear or paralabral cyst.   Bones: No acute or significant extra-articular osseous findings.   Other: No significant soft tissue findings.   IMPRESSION: 1. Mild supraspinatus and infraspinatus tendinosis. No evidence of rotator cuff tear. 2. The labrum and biceps tendon appear intact. 3. Unfused os acromiale with prominent reactive edema posteriorly in the acromion adjacent to the synchondrosis which may contribute to shoulder pain and rotator cuff impingement.      PATIENT SURVEYS:  Quick Dash 20% disability   COGNITION:          Overall cognitive status: Within functional limits for tasks assessed                               SENSATION:          Light touch: Appears intact          Stereognosis: Appears intact          Hot/Cold: Appears intact          Proprioception: Appears intact   UPPER EXTREMITY AROM/PROM:                     WNL all bilateral UE motions   UPPER EXTREMITY MMT:   MMT Right 06/04/2021 Left 06/04/2021  Shoulder flexion 5/5 5/5  Shoulder abduction (C5) 5/5 4/5  Shoulder ER 4/5 4/5  Shoulder IR 5/5 4/5   Middle trapezius      Lower trapezius      Shoulder extension      Grip strength      Cervical flexion (C1,C2)      Cervical S/B (C3)      Shoulder shrug (C4)      Elbow flexion (C6)      Elbow ext (C7)      Thumb ext (C8)      Finger abd (T1)        (Blank rows = not tested, score listed is out of 5 possible points.  N = WNL, D = diminished, C = clear for gross weakness with myotome testing, * = concordant pain with testing)    SHOULDER SPECIAL TESTS:           Impingement tests: Neer impingement test: negative and Hawkins/Kennedy impingement test: negative           SLAP lesions:  N/A           Instability tests:  N/A  Rotator cuff assessment: Drop arm test: negative and Empty can test: negative           Biceps assessment: N/A   JOINT MOBILITY TESTING:  N/A   PALPATION:  TTP to bilateral subscapularis muscle insertion, AC joint   POSTURE:  Medium body habitus; rounded shoulders            TODAY'S TREATMENT:06/14/21  Therapeutic Exercise: Row x 34# 20 x 3 (free motion)  Shoulder extension 26#  15 x 2  Corner stretch x 30" Bilateral ER w/ scap retrac 20 x 3  BTB Shoulder IR 10# 20 x 2 bilat  Standing  horizontal abduction x 20 green x 2  Standing star pattern green band 2 x 15  10# overhead press using bilateral UE x 3 sets     TREATMENT:06/11/21  Therapeutic Exercise: Row x 26# 20 x 3 (free motion)  Shoulder extension 20#  20 x 3  Corner stretch x 30" Bilateral ER w/ scap retrac 20 x 3  BTB  shoulder IR iso x 10 x 5 sec , 2 sets bilat Shoulder IR green band 20 x 3 bilat  Supine horizontal abduction x 20 green  Supine star pattern green band x 20   Initial TREATMENT:  Therapeutic Exercise: Row x 15 Black TB Corner stretch 3 x 30 Sec  Bilateral ER w/ scap retrac x 10 RTB L shoulder IR/ER iso x 5 - 5"     PATIENT EDUCATION: Education details: continue HEP Person educated: Patient Education method: Explanation, Demonstration, and  Handouts Education comprehension: verbalized understanding and returned demonstration     HOME EXERCISE PROGRAM: Access Code: [email or web address]   ASSESSMENT:   CLINICAL IMPRESSION: Pt reports compliance with HEP and no recent pain. Session spent with review of HEP and progression of RTC and scap strength. Began overhead lifting for progression toward goals. He tolerated the session well with reports of muscle burning, no increased pain.  He would continue to benefit from skilled PT services working on improving RTC and periscapular strength and improving shoulder biomechanics in order to decrease pain and improve funciton.    REHAB POTENTIAL: Excellent   CLINICAL DECISION MAKING: Stable/uncomplicated   EVALUATION COMPLEXITY: Low     GOALS: Goals reviewed with patient? No   SHORT TERM GOALS:   STG Name Target Date Goal status  1 Pt will be compliant and knowledgeable with initial HEP for improved comfort and carryover Baseline: initial HEP given 06/25/2021 INITIAL    LONG TERM GOALS:    LTG Name Target Date Goal status  1 Pt will decrease Quick DASH disability score to no greater than 5% as proxy for functional improvement Baseline: 20% disability 07/30/2021 INITIAL  2 Pt will self report bilateral shoulder pain no greater than 0/10 for improved comfort and functional ability Baseline: 3/10 at worst 07/30/2021 INITIAL  3 Pt will be able to lift 25lb KB overhead with no increase in bilateral shoulder pain in order to improve comfort with work related duties Baseline: unable 07/30/2021 INITIAL  4 Pt will be knowledgeable of advanced HEP for continued improvement post d/c from skilled PT Baseline: 07/30/2021 INITIAL    PLAN: PT FREQUENCY: 1-2x/week   PT DURATION: 8 weeks   PLANNED INTERVENTIONS: Therapeutic exercises, Therapeutic activity, Neuro Muscular re-education, Balance training, Gait training, Patient/Family education, Joint mobilization, Dry Needling, Spinal mobilization, Cryotherapy,  Moist heat, and Manual therapy   PLAN FOR NEXT SESSION: assess HEP response, progress RTC and periscapular strengthening as able  Jannette Spanner, PTA 06/14/21 9:29 AM Phone: [phone] Fax: [phone]

## 2021-06-16 ENCOUNTER — Other Ambulatory Visit: Payer: Self-pay | Admitting: Podiatry

## 2021-06-16 NOTE — Progress Notes (Signed)
Cardiology Office Note   Date:  06/19/2021   ID:  Christopher Abbott, DOB 03/31/1976, MRN [id number]  PCP:  Tomasita Morrow, MD  Cardiologist:   Krystyne Tewksbury Martinique, MD   Chief Complaint  Patient presents with   Chest Pain      History of Present Illness: Christopher Abbott is a 46 y.o. male who is seen at the request of Dr Jimmye Norman for evaluation of chest pain. He has a history of DM and HTN. Also history of tobacco abuse. He was seen in the ED on 06/12/21 for evaluation of chest pain. Ecg showed no acute change from prior - T wave inversion in lateral precordial leads with LVH.   He reports he has been having chest tightness for one month. Localized to left breast. May be aggravated by stress. Not with exertion. No relief except with Klonopin. 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     Past Medical History:  Diagnosis Date   Diabetes mellitus    type 2   GERD (gastroesophageal reflux disease)    Hyperlipidemia    Hypertension     Past Surgical History:  Procedure Laterality Date   ACHILLES TENDON SURGERY Left 07/18/2015   Procedure: LEFT ACHILLES TENDON DIRECT PRIMARY REPAIR;  Surgeon: Mcarthur Rossetti, MD;  Location: [place];  Service: Orthopedics;  Laterality: Left;   COLONOSCOPY WITH PROPOFOL N/A 08/14/2017   Procedure: COLONOSCOPY WITH PROPOFOL;  Surgeon: Lin Landsman, MD;  Location: [facility] ENDOSCOPY;  Service: Gastroenterology;  Laterality: N/A;   NO PAST SURGERIES       Current Outpatient Medications  Medication Sig Dispense Refill   ACCU-CHEK FASTCLIX LANCETS MISC USE AS DIRECTED TO CHECK BLOOD SUAGR DX: E11.9  11   ACCU-CHEK GUIDE test strip USE AS DIRECTED TO TEST BLOOD SUGAR TWICE A DAY DX:E11.9  11   acetaminophen (TYLENOL) 325 MG tablet Take 2 tablets (650 mg total) by mouth every 6 (six) hours as needed for moderate pain. 30 tablet 0   B-D ULTRAFINE III SHORT PEN 31G X 8 MM MISC USE WITH INSULIN PEN INJECTIONS DAILY ( DX:  E11.9)  11   benzonatate (TESSALON) 100 MG capsule Take 1-2 capsules (100-200 mg total) by mouth 3 (three) times daily as needed for cough. 60 capsule 0   Blood Glucose Monitoring Suppl (ACCU-CHEK GUIDE) w/Device KIT USE AS DIRECTED DX E11.9 (MEDICAID PREFERRED)  0   esomeprazole (NEXIUM) 20 MG capsule Take 20 mg by mouth daily at 12 noon.     HYDROcodone-acetaminophen (NORCO/VICODIN) 5-325 MG tablet Take 2 tablets by mouth every 4 (four) hours as needed. 10 tablet 0   insulin glargine (LANTUS) 100 UNIT/ML injection Inject 70 Units into the skin daily.      LANTUS SOLOSTAR 100 UNIT/ML Solostar Pen INJECT 45 UNITS INTO THE SKIN 4 TIMES A DAY  3   levocetirizine (XYZAL) 5 MG tablet Take 1 tablet (5 mg total) by mouth every evening. 90 tablet 0   lisinopril-hydrochlorothiazide (PRINZIDE,ZESTORETIC) 20-12.5 MG tablet Take 1 tablet by mouth daily. for high blood pressure  5   meloxicam (MOBIC) 15 MG tablet Take 1 tablet (15 mg total) by mouth daily. 30 tablet 3   metFORMIN (GLUCOPHAGE) 1000 MG tablet Take 1,000 mg by mouth 2 (two) times daily with a meal.     pravastatin (PRAVACHOL) 40 MG tablet Take 40 mg by mouth every evening. for cholesterol  1   promethazine-dextromethorphan (PROMETHAZINE-DM) 6.25-15 MG/5ML syrup Take 5  mLs by mouth 4 (four) times daily as needed for cough. 300 mL 0   SUPREP BOWEL PREP KIT 17.5-3.13-1.6 GM/177ML SOLN See admin instructions.  0   terbinafine (LAMISIL) 250 MG tablet Take 1 tablet (250 mg total) by mouth daily. 45 tablet 0   tiZANidine (ZANAFLEX) 4 MG tablet Take 1 tablet (4 mg total) by mouth at bedtime. 30 tablet 0   Vitamin D, Ergocalciferol, (DRISDOL) 50000 units CAPS capsule TAKE ONE CAPSULE BY MOUTH ONCE A WEEK FOR 12 WEEKS  3   hydrocortisone (ANUSOL-HC) 2.5 % rectal cream Apply rectally 2 times daily (Patient not taking: Reported on 06/12/2021) 28 g 2   No current facility-administered medications for this visit.    Allergies:   Patient has no known  allergies.    Social History:  The patient  reports that he has been smoking cigars. He has never used smokeless tobacco. He reports that he does not currently use alcohol. He reports that he does not use drugs.   Family History:  The patient's family history includes Diabetes in his father, mother, and sister.    ROS:  Please see the history of present illness.   Otherwise, review of systems are positive for none.   All other systems are reviewed and negative.    PHYSICAL EXAM: VS:  BP 107/77    Pulse 89    Wt 276 lb (125.2 kg)    SpO2 99%    BMI 33.60 kg/m  , BMI Body mass index is 33.6 kg/m. GEN: Well nourished, well developed, in no acute distress HEENT: normal Neck: no JVD, carotid bruits, or masses Cardiac: RRR; no murmurs, rubs, or gallops,no edema  Respiratory:  clear to auscultation bilaterally, normal work of breathing GI: soft, nontender, nondistended, + BS MS: no deformity or atrophy Skin: warm and dry, no rash Neuro:  Strength and sensation are intact Psych: euthymic mood, full affect   EKG:  EKG is ordered today. The ekg ordered today demonstrates NSR with nonspecific TWA. I have personally reviewed and interpreted this study.    Recent Labs: No results found for requested labs within last 8760 hours.    Lipid Panel No results found for: CHOL, TRIG, HDL, CHOLHDL, VLDL, LDLCALC, LDLDIRECT    Wt Readings from Last 3 Encounters:  06/19/21 276 lb (125.2 kg)  02/15/20 250 lb (113.4 kg)  08/06/19 250 lb (113.4 kg)      Other studies Reviewed: Additional studies/ records that were reviewed today include: none. Review of the above records demonstrates: N/A   ASSESSMENT AND PLAN:  1.  Chest pain of uncertain etiology. Multiple risk factors including IDDM, HTN, HLD. Some tobacco use. Recommend ischemic evaluation. Discussed possible stress testing versus CTA. After discussion will go ahead and arrange for coronary CTA.  2. HTN controlled 3. IDDM 4. HLD. On  pravastatin. Lipid levels unknown 5. Tobacco use - encourage smoking cessation.   Current medicines are reviewed at length with the patient today.  The patient does not have concerns regarding medicines.  The following changes have been made:  no change  Labs/ tests ordered today include:   Orders Placed This Encounter  Procedures   CT CORONARY MORPH W/CTA COR W/SCORE W/CA W/CM &/OR WO/CM   Basic metabolic panel   EKG 01-VCBS         Disposition:   FU with me TBD  Signed, Jari Dipasquale Martinique, MD  06/19/2021 4:07 PM    [facility] [address] [place],  [place], [address] Phone [phone], Fax [phone]

## 2021-06-18 ENCOUNTER — Other Ambulatory Visit: Payer: Self-pay

## 2021-06-18 ENCOUNTER — Ambulatory Visit: Payer: Medicaid Other | Admitting: Physical Therapy

## 2021-06-18 ENCOUNTER — Encounter: Payer: Self-pay | Admitting: Physical Therapy

## 2021-06-18 DIAGNOSIS — M25511 Pain in right shoulder: Secondary | ICD-10-CM | POA: Diagnosis not present

## 2021-06-18 DIAGNOSIS — G8929 Other chronic pain: Secondary | ICD-10-CM

## 2021-06-18 NOTE — Telephone Encounter (Signed)
Please schedule patient for a f/u appointment,refill on medication

## 2021-06-18 NOTE — Therapy (Signed)
OUTPATIENT PHYSICAL THERAPY TREATMENT NOTE   Patient Name: Christopher Abbott MRN: [id number] DOB:March 07, 1976, 46 y.o., male Today's Date: 06/18/2021  PCP: Tomasita Morrow, MD REFERRING PROVIDER: Tomasita Morrow, MD   PT End of Session - 06/18/21 0801     Visit Number 4    Number of Visits 15    Date for PT Re-Evaluation 07/30/21    Authorization Type [employer]- pending    PT Start Time 0800    PT Stop Time 0838    PT Time Calculation (min) 38 min             Past Medical History:  Diagnosis Date   Diabetes mellitus    type 2   GERD (gastroesophageal reflux disease)    Hypertension    Past Surgical History:  Procedure Laterality Date   ACHILLES TENDON SURGERY Left 07/18/2015   Procedure: LEFT ACHILLES TENDON DIRECT PRIMARY REPAIR;  Surgeon: Mcarthur Rossetti, MD;  Location: [place];  Service: Orthopedics;  Laterality: Left;   COLONOSCOPY WITH PROPOFOL N/A 08/14/2017   Procedure: COLONOSCOPY WITH PROPOFOL;  Surgeon: Lin Landsman, MD;  Location: [facility] ENDOSCOPY;  Service: Gastroenterology;  Laterality: N/A;   NO PAST SURGERIES     Patient Active Problem List   Diagnosis Date Noted   Internal hemorrhoid, bleeding 08/29/2017   Type 2 diabetes mellitus ([place]) 08/06/2017   Achilles rupture, left 07/18/2015   Rupture of left Achilles tendon 07/18/2015  Referring Provider: Mcarthur Rossetti   REFERRING DIAG: M25.511,G89.29,M25.512 (ICD-10-CM) - Chronic pain of both shoulders  THERAPY DIAG: Chronic Pain of both shoulders    PERTINENT HISTORY: Chronic bilateral shoulder pain, especially with overhead motions  PRECAUTIONS: None   WEIGHT BEARING RESTRICTIONS No  SUBJECTIVE: I do my exercises. My nephew laid on  my arm and it went numb. I am going to the cardiologist to check on the tightness I have been having in my chest.   PAIN:  Are you having pain? No Numeric Pain scale: 0/10  Pain location: bilateral shoulder PAIN TYPE: aching and numbness Pain  description: intermittent  Aggravating factors: certain lifting motions Relieving factors: medication     OBJECTIVE:    DIAGNOSTIC FINDINGS:  CLINICAL DATA:  Bilateral shoulder pain for 6 months. No relief from previous shoulder injection. No acute injury or prior relevant surgery.   EXAM: MRI OF THE RIGHT SHOULDER WITHOUT CONTRAST   TECHNIQUE: Multiplanar, multisequence MR imaging of the shoulder was performed. No intravenous contrast was administered.   COMPARISON:  Radiographs 02/15/2020   FINDINGS: Rotator cuff: Mild supraspinatus tendinosis without tear. The infraspinatus, subscapularis and teres minor tendons appear normal.   Muscles:  No focal muscular atrophy or edema.   Biceps long head:  Intact and normally positioned.   Acromioclavicular Joint: The acromion is type 2. 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 Mild-to-moderate acromioclavicular degenerative changes are present. No significant fluid is present in the subacromial - subdeltoid bursa.   Glenohumeral Joint: Mild glenohumeral degenerative changes. No evidence of shoulder joint effusion.   Labrum: Labral assessment is limited by the lack of joint fluid. No evidence of labral tear or paralabral cyst.   Bones: No acute or significant extra-articular osseous findings.   Other: No significant soft tissue findings.   IMPRESSION: 1. Mild supraspinatus tendinosis. No evidence of rotator cuff tear or focal muscular atrophy. 2. The labrum and biceps tendon appear intact. 3. Mild-to-moderate acromioclavicular degenerative changes. Incompletely fused os acromiale without significant surrounding marrow edema as seen in the  opposite shoulder.   CLINICAL DATA:  Bilateral shoulder pain for 6 months. No improvement following shoulder injection. No acute injury or prior relevant surgery.   EXAM: MRI OF THE LEFT SHOULDER WITHOUT CONTRAST   TECHNIQUE: Multiplanar, multisequence MR imaging of the  shoulder was performed. No intravenous contrast was administered.   COMPARISON:  Radiographs 02/15/2020.   FINDINGS: Rotator cuff: Mild supraspinatus and infraspinatus tendinosis without evidence of tear. The subscapularis and teres minor tendons appear normal.   Muscles:  No focal muscular atrophy or edema.   Biceps long head:  Intact and normally positioned.   Acromioclavicular Joint: The acromion is type 2. 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 There are mild acromioclavicular degenerative changes. A small amount of fluid is present in the subacromial-subdeltoid bursa.   Glenohumeral Joint: Mild glenohumeral degenerative changes. No significant shoulder joint effusion.   Labrum: Labral assessment limited by the lack of joint fluid. No evidence of labral tear or paralabral cyst.   Bones: No acute or significant extra-articular osseous findings.   Other: No significant soft tissue findings.   IMPRESSION: 1. Mild supraspinatus and infraspinatus tendinosis. No evidence of rotator cuff tear. 2. The labrum and biceps tendon appear intact. 3. Unfused os acromiale with prominent reactive edema posteriorly in the acromion adjacent to the synchondrosis which may contribute to shoulder pain and rotator cuff impingement.      PATIENT SURVEYS:  Quick Dash 20% disability   COGNITION:          Overall cognitive status: Within functional limits for tasks assessed                               SENSATION:          Light touch: Appears intact          Stereognosis: Appears intact          Hot/Cold: Appears intact          Proprioception: Appears intact   UPPER EXTREMITY AROM/PROM:                     WNL all bilateral UE motions   UPPER EXTREMITY MMT:   MMT Right 06/04/2021 Left 06/04/2021  Shoulder flexion 5/5 5/5  Shoulder abduction (C5) 5/5 4/5  Shoulder ER 4/5 4/5  Shoulder  IR 5/5 4/5  Middle trapezius      Lower trapezius      Shoulder extension      Grip strength      Cervical flexion (C1,C2)      Cervical S/B (C3)      Shoulder shrug (C4)      Elbow flexion (C6)      Elbow ext (C7)      Thumb ext (C8)      Finger abd (T1)        (Blank rows = not tested, score listed is out of 5 possible points.  N = WNL, D = diminished, C = clear for gross weakness with myotome testing, * = concordant pain with testing)    SHOULDER SPECIAL TESTS:           Impingement tests: Neer impingement test: negative and Hawkins/Kennedy impingement test: negative           SLAP lesions:  N/A           Instability tests:  N/A           Rotator cuff assessment: Drop arm test: negative and Empty can test: negative           Biceps assessment: N/A   JOINT MOBILITY TESTING:  N/A   PALPATION:  TTP to bilateral subscapularis muscle insertion, AC joint   POSTURE:  Medium body habitus; rounded shoulders            TODAY'S TREATMENT:06/18/21  Therapeutic Exercise UBE level 3.0 3 min each way.  Row x 34# 20 x 3 (free motion)  Shoulder extension 26#  15 x 2  Corner stretch x 30" Bilateral ER w/ scap retrac 20 x 3  BTB Shoulder IR 10# 20 x 2 bilat  Standing  horizontal abduction x 20 green x2 Standing star pattern green band 2 x 10 15# overhead press using bilateral UE x 3 sets of 10    TREATMENT:06/14/21  Therapeutic Exercise: Row x 34# 20 x 3 (free motion)  Shoulder extension 26#  15 x 2  Corner stretch x 30" Bilateral ER w/ scap retrac 20 x 3  BTB Shoulder IR 10# 20 x 2 bilat  Standing  horizontal abduction x 20 green x 2  Standing star pattern green band 2 x 15  10# overhead press using bilateral UE x 3 sets     TREATMENT:06/11/21  Therapeutic Exercise: Row x 26# 20 x 3 (free motion)  Shoulder extension 20#  20 x 3  Corner stretch x 30" Bilateral ER w/ scap retrac 20 x 3  BTB  shoulder IR iso x 10 x 5 sec , 2 sets bilat Shoulder IR green band 20 x 3 bilat   Supine horizontal abduction x 20 green  Supine star pattern green band x 20   Initial TREATMENT:  Therapeutic Exercise: Row x 15 Black TB Corner stretch 3 x 30 Sec  Bilateral ER w/ scap retrac x 10 RTB L shoulder IR/ER iso x 5 - 5"     PATIENT EDUCATION: Education details: continue HEP Person educated: Patient Education method: Explanation, Demonstration, and Handouts Education comprehension: verbalized understanding and returned demonstration     HOME EXERCISE PROGRAM: Access Code: [email or web address]   ASSESSMENT:   CLINICAL IMPRESSION: Pt reports compliance with HEP and no recent pain. STG #1 met. Session spent with RTC and scap strengthening. Continued overhead lifting for progression toward goals. He tolerated the session well with reports of muscle burning, no increased pain.  He would continue to benefit from skilled PT services working on improving RTC and periscapular strength and improving shoulder biomechanics in order to decrease pain and improve funciton.    REHAB POTENTIAL: Excellent   CLINICAL DECISION MAKING: Stable/uncomplicated   EVALUATION COMPLEXITY: Low     GOALS: Goals reviewed with patient? No   SHORT TERM GOALS:   STG Name Target Date Goal status  1 Pt will be compliant and knowledgeable with initial HEP for improved comfort and carryover Baseline: initial HEP given 06/25/2021 Met 06/18/21    LONG TERM GOALS:    LTG Name Target Date Goal status  1 Pt will decrease Quick DASH disability score to no greater than 5% as proxy for functional improvement Baseline: 20% disability 07/30/2021 INITIAL  2 Pt will self report bilateral shoulder pain no greater than 0/10 for improved comfort and functional ability Baseline: 3/10 at worst 07/30/2021 INITIAL  3 Pt will be able to lift 25lb KB overhead with no increase in bilateral shoulder pain in order to improve comfort  with work related duties Baseline: unable 07/30/2021 INITIAL  4 Pt will be knowledgeable of advanced  HEP for continued improvement post d/c from skilled PT Baseline: 07/30/2021 INITIAL    PLAN: PT FREQUENCY: 1-2x/week   PT DURATION: 8 weeks   PLANNED INTERVENTIONS: Therapeutic exercises, Therapeutic activity, Neuro Muscular re-education, Balance training, Gait training, Patient/Family education, Joint mobilization, Dry Needling, Spinal mobilization, Cryotherapy, Moist heat, and Manual therapy   PLAN FOR NEXT SESSION: assess HEP response, progress RTC and periscapular strengthening as able, continue lifting progression.      Hessie Diener, PTA 06/18/21 8:10 AM Phone: [phone] Fax: [phone]

## 2021-06-19 ENCOUNTER — Encounter: Payer: Self-pay | Admitting: Cardiology

## 2021-06-19 ENCOUNTER — Ambulatory Visit: Payer: Medicaid Other | Admitting: Cardiology

## 2021-06-19 VITALS — BP 107/77 | HR 89 | Wt 276.0 lb

## 2021-06-19 DIAGNOSIS — I10 Essential (primary) hypertension: Secondary | ICD-10-CM | POA: Diagnosis not present

## 2021-06-19 DIAGNOSIS — R072 Precordial pain: Secondary | ICD-10-CM

## 2021-06-19 DIAGNOSIS — E782 Mixed hyperlipidemia: Secondary | ICD-10-CM | POA: Diagnosis not present

## 2021-06-19 DIAGNOSIS — E109 Type 1 diabetes mellitus without complications: Secondary | ICD-10-CM | POA: Diagnosis not present

## 2021-06-19 DIAGNOSIS — R079 Chest pain, unspecified: Secondary | ICD-10-CM

## 2021-06-19 MED ORDER — METOPROLOL TARTRATE 100 MG PO TABS: ORAL_TABLET | ORAL | 0 refills | Status: DC

## 2021-06-19 NOTE — Patient Instructions (Addendum)
Medication Instructions:  Continue same medications   Lab Work: Bmet today   Testing/Procedures: Coronary CT will be scheduled after approved by insurance   Follow instructions below   Follow-Up: At [facility], you and your health needs are our priority.  As part of our continuing mission to provide you with exceptional heart care, we have created designated Provider Care Teams.  These Care Teams include your primary Cardiologist (physician) and Advanced Practice Providers (APPs -  Physician Assistants and Nurse Practitioners) who all work together to provide you with the care you need, when you need it.  We recommend signing up for the patient portal called "MyChart".  Sign up information is provided on this After Visit Summary.  MyChart is used to connect with patients for Virtual Visits (Telemedicine).  Patients are able to view lab/test results, encounter notes, upcoming appointments, etc.  Non-urgent messages can be sent to your provider as well.   To learn more about what you can do with MyChart, go to [email or web address].      Your next appointment:  To Be Determined    The format for your next appointment: Office   Provider:  Dr.Jordan       Your cardiac CT will be scheduled at one of the below locations:   [facility] [address] [place], [place] [address] [phone]  OR  [facility] [address] [place], [place] [address] [phone]  If scheduled at [facility], please arrive at the [facility] main entrance (entrance A) of [facility] 30 minutes prior to test start time. You can use the FREE valet parking offered at the main entrance (encouraged to control the heart rate for the test) Proceed to the [facility] Radiology Department (first floor) to check-in and test prep.  If scheduled at [facility], please arrive 15 mins early for  check-in and test prep.  Please follow these instructions carefully (unless otherwise directed):  Hold all erectile dysfunction medications at least 3 days (72 hrs) prior to test.  On the Night Before the Test: Be sure to Drink plenty of water. Do not consume any caffeinated/decaffeinated beverages or chocolate 12 hours prior to your test. Do not take any antihistamines 12 hours prior to your test.   On the Day of the Test: Drink plenty of water until 1 hour prior to the test. Do not eat any food 4 hours prior to the test. You may take your regular medications prior to the test.  Take metoprolol 100 mg two hours prior to test.           After the Test: Drink plenty of water. After receiving IV contrast, you may experience a mild flushed feeling. This is normal. On occasion, you may experience a mild rash up to 24 hours after the test. This is not dangerous. If this occurs, you can take Benadryl 25 mg and increase your fluid intake. If you experience trouble breathing, this can be serious. If it is severe call 911 IMMEDIATELY. If it is mild, please call our office. If you take any of these medications: Metformin,  please do not take 48 hours after completing test unless otherwise instructed.  We will call to schedule your test 2-4 weeks out understanding that some insurance companies will need an authorization prior to the service being performed.   For non-scheduling related questions, please contact the cardiac imaging nurse navigator should you have any questions/concerns: Huntley Dec  Juleen China, Cardiac Imaging Nurse Navigator Gordy Clement, Cardiac Imaging Nurse Navigator [facility] Direct Office Dial: [phone]   For scheduling needs, including cancellations and rescheduling, please call [place], [phone].

## 2021-06-20 LAB — BASIC METABOLIC PANEL
BUN/Creatinine Ratio: 9 (ref 9–20)
BUN: 9 mg/dL (ref 6–24)
CO2: 21 mmol/L (ref 20–29)
Calcium: 9.7 mg/dL (ref 8.7–10.2)
Chloride: 99 mmol/L (ref 96–106)
Creatinine, Ser: 0.96 mg/dL (ref 0.76–1.27)
Glucose: 78 mg/dL (ref 70–99)
Potassium: 4 mmol/L (ref 3.5–5.2)
Sodium: 142 mmol/L (ref 134–144)
eGFR: 101 mL/min/{1.73_m2} (ref >59)

## 2021-06-21 ENCOUNTER — Encounter: Payer: Self-pay | Admitting: Physical Therapy

## 2021-06-21 ENCOUNTER — Other Ambulatory Visit: Payer: Self-pay

## 2021-06-21 ENCOUNTER — Ambulatory Visit: Payer: Medicaid Other | Admitting: Physical Therapy

## 2021-06-21 VITALS — BP 144/88

## 2021-06-21 DIAGNOSIS — G8929 Other chronic pain: Secondary | ICD-10-CM

## 2021-06-21 DIAGNOSIS — M25511 Pain in right shoulder: Secondary | ICD-10-CM

## 2021-06-21 NOTE — Therapy (Signed)
OUTPATIENT PHYSICAL THERAPY TREATMENT NOTE   Patient Name: Christopher Abbott MRN: [id number] DOB:[birth year], 46 y.o., male Today's Date: 06/21/2021  PCP: Tomasita Morrow, MD REFERRING PROVIDER: Mcarthur Rossetti*   PT End of Session - 06/21/21 0754     Visit Number 5    Number of Visits 15    Date for PT Re-Evaluation 07/30/21    Authorization Type [employer]- pending    Authorization Time Period 06/11/21-07/24/21 12 visits    Authorization - Visit Number 4    Authorization - Number of Visits 12    PT Start Time 0750    PT Stop Time 0820    PT Time Calculation (min) 30 min             Past Medical History:  Diagnosis Date   Diabetes mellitus    type 2   GERD (gastroesophageal reflux disease)    Hyperlipidemia    Hypertension    Past Surgical History:  Procedure Laterality Date   ACHILLES TENDON SURGERY Left 07/18/2015   Procedure: LEFT ACHILLES TENDON DIRECT PRIMARY REPAIR;  Surgeon: Mcarthur Rossetti, MD;  Location: [facility];  Service: Orthopedics;  Laterality: Left;   COLONOSCOPY WITH PROPOFOL N/A 08/14/2017   Procedure: COLONOSCOPY WITH PROPOFOL;  Surgeon: Lin Landsman, MD;  Location: [facility] ENDOSCOPY;  Service: Gastroenterology;  Laterality: N/A;   NO PAST SURGERIES     Patient Active Problem List   Diagnosis Date Noted   Internal hemorrhoid, bleeding 08/29/2017   Type 2 diabetes mellitus ([place]) 08/06/2017   Achilles rupture, left 07/18/2015   Rupture of left Achilles tendon 07/18/2015  Referring Provider: Mcarthur Rossetti   REFERRING DIAG: M25.511,G89.29,M25.512 (ICD-10-CM) - Chronic pain of both shoulders  THERAPY DIAG: Chronic Pain of both shoulders    PERTINENT HISTORY: Chronic bilateral shoulder pain, especially with overhead motions  PRECAUTIONS: None   WEIGHT BEARING RESTRICTIONS No  SUBJECTIVE: I will have a cardiac CT Tuesday due to intermittent left chest tightness I have been having. MD told me not to play basketball for  now but he did not say anything about Physical Therapy. I do not have any chest pain with my exercises. No shoulder pain.   PAIN:  Are you having pain? No Numeric Pain scale: 0/10  Pain location: bilateral shoulder PAIN TYPE: aching and numbness Pain description: intermittent  Aggravating factors: certain lifting motions Relieving factors: medication     OBJECTIVE:    DIAGNOSTIC FINDINGS:  CLINICAL DATA:  Bilateral shoulder pain for 6 months. No relief from previous shoulder injection. No acute injury or prior relevant surgery.   EXAM: MRI OF THE RIGHT SHOULDER WITHOUT CONTRAST   TECHNIQUE: Multiplanar, multisequence MR imaging of the shoulder was performed. No intravenous contrast was administered.   COMPARISON:  Radiographs 02/15/2020   FINDINGS: Rotator cuff: Mild supraspinatus tendinosis without tear. The infraspinatus, subscapularis and teres minor tendons appear normal.   Muscles:  No focal muscular atrophy or edema.   Biceps long head:  Intact and normally positioned.   Acromioclavicular Joint: The acromion is type 2. 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 Mild-to-moderate acromioclavicular degenerative changes are present. No significant fluid is present in the subacromial - subdeltoid bursa.   Glenohumeral Joint: Mild glenohumeral degenerative changes. No evidence of shoulder joint effusion.   Labrum: Labral assessment is limited by the lack of joint fluid. No evidence of labral tear or paralabral cyst.   Bones: No acute or significant extra-articular osseous findings.   Other: No significant  soft tissue findings.   IMPRESSION: 1. Mild supraspinatus tendinosis. No evidence of rotator cuff tear or focal muscular atrophy. 2. The labrum and biceps tendon appear intact. 3. Mild-to-moderate acromioclavicular degenerative changes. Incompletely fused os acromiale without significant surrounding marrow edema as seen in the opposite shoulder.    CLINICAL DATA:  Bilateral shoulder pain for 6 months. No improvement following shoulder injection. No acute injury or prior relevant surgery.   EXAM: MRI OF THE LEFT SHOULDER WITHOUT CONTRAST   TECHNIQUE: Multiplanar, multisequence MR imaging of the shoulder was performed. No intravenous contrast was administered.   COMPARISON:  Radiographs 02/15/2020.   FINDINGS: Rotator cuff: Mild supraspinatus and infraspinatus tendinosis without evidence of tear. The subscapularis and teres minor tendons appear normal.   Muscles:  No focal muscular atrophy or edema.   Biceps long head:  Intact and normally positioned.   Acromioclavicular Joint: The acromion is type 2. 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 There are mild acromioclavicular degenerative changes. A small amount of fluid is present in the subacromial-subdeltoid bursa.   Glenohumeral Joint: Mild glenohumeral degenerative changes. No significant shoulder joint effusion.   Labrum: Labral assessment limited by the lack of joint fluid. No evidence of labral tear or paralabral cyst.   Bones: No acute or significant extra-articular osseous findings.   Other: No significant soft tissue findings.   IMPRESSION: 1. Mild supraspinatus and infraspinatus tendinosis. No evidence of rotator cuff tear. 2. The labrum and biceps tendon appear intact. 3. Unfused os acromiale with prominent reactive edema posteriorly in the acromion adjacent to the synchondrosis which may contribute to shoulder pain and rotator cuff impingement.      PATIENT SURVEYS:  Quick Dash 20% disability   COGNITION:          Overall cognitive status: Within functional limits for tasks assessed                               SENSATION:          Light touch: Appears intact          Stereognosis: Appears intact          Hot/Cold: Appears intact           Proprioception: Appears intact   UPPER EXTREMITY AROM/PROM:                     WNL all bilateral UE motions   UPPER EXTREMITY MMT:   MMT Right 06/04/2021 Left 06/04/2021 Right  06/21/21 Left  06/21/21  Shoulder flexion 5/5 5/5 5/5 5/5  Shoulder abduction (C5) 5/5 4/5 4/5 P! 4+/5  Shoulder ER 4/5 4/5 5/5 5/5  Shoulder IR 5/5 4/5 5/5 5/5  Middle trapezius        Lower trapezius        Shoulder extension        Grip strength        Cervical flexion (C1,C2)        Cervical S/B (C3)        Shoulder shrug (C4)        Elbow flexion (C6)        Elbow ext (C7)        Thumb ext (C8)        Finger abd (T1)          (Blank rows = not tested, score  listed is out of 5 possible points.  N = WNL, D = diminished, C = clear for gross weakness with myotome testing, * = concordant pain with testing)    SHOULDER SPECIAL TESTS:           Impingement tests: Neer impingement test: negative and Hawkins/Kennedy impingement test: negative           SLAP lesions:  N/A           Instability tests:  N/A           Rotator cuff assessment: Drop arm test: negative and Empty can test: negative           Biceps assessment: N/A   JOINT MOBILITY TESTING:  N/A   PALPATION:  TTP to bilateral subscapularis muscle insertion, AC joint   POSTURE:  Medium body habitus; rounded shoulders            TODAY'S TREATMENT:06/21/21  Therapeutic Exercise UBE level 3.0 3 min each way Row x 34# 20 x 3 (free motion)  Shoulder extension 26#  15 x 2  Lateral raise 4# 15 x 2 , green band 1 set bilat x 10 Corner stretch x 30" x 2- felt left chest pulling Shoulder IR 10# 20 x 2 bilat  25# overhead press using bilateral UE x 3 sets of 10   TREATMENT:06/18/21  Therapeutic Exercise UBE level 3.0 3 min each way.  Row x 34# 20 x 3 (free motion)  Shoulder extension 26#  15 x 2  Corner stretch x 30" Bilateral ER w/ scap retrac 20 x 3  BTB Shoulder IR 10# 20 x 2 bilat  Standing  horizontal abduction x 20 green x2 Standing star  pattern green band 2 x 10 15# overhead press using bilateral UE x 3 sets of 10    TREATMENT:06/14/21  Therapeutic Exercise: Row x 34# 20 x 3 (free motion)  Shoulder extension 26#  15 x 2  Corner stretch x 30" Bilateral ER w/ scap retrac 20 x 3  BTB Shoulder IR 10# 20 x 2 bilat  Standing  horizontal abduction x 20 green x 2  Standing star pattern green band 2 x 15  10# overhead press using bilateral UE x 3 sets     TREATMENT:06/11/21  Therapeutic Exercise: Row x 26# 20 x 3 (free motion)  Shoulder extension 20#  20 x 3  Corner stretch x 30" Bilateral ER w/ scap retrac 20 x 3  BTB  shoulder IR iso x 10 x 5 sec , 2 sets bilat Shoulder IR green band 20 x 3 bilat  Supine horizontal abduction x 20 green  Supine star pattern green band x 20   Initial TREATMENT:  Therapeutic Exercise: Row x 15 Black TB Corner stretch 3 x 30 Sec  Bilateral ER w/ scap retrac x 10 RTB L shoulder IR/ER iso x 5 - 5"     PATIENT EDUCATION: Education details: continue HEP Person educated: Patient Education method: Explanation, Demonstration, and Handouts Education comprehension: verbalized understanding and returned demonstration     HOME EXERCISE PROGRAM: Access Code: [email or web address]   ASSESSMENT:   CLINICAL IMPRESSION: Pt reports compliance with HEP. He saw cardiologist due to intermittent chest tightness and was scheduled for Cardiac CT for next week.  His MMT has improved in all planes except pain with right shoulder abduction with pain upon testing. He insisted on trying 25# overhead press and completed 3 sets of 10 reps without shoulder pain or chest tightness. LTG# 3  met.  He would continue to benefit from skilled PT services working on improving RTC and periscapular strength and improving shoulder biomechanics in order to decrease pain and improve funciton. Will HOLD PT for next week due to cardiac testing and resume the following week.    REHAB POTENTIAL: Excellent   CLINICAL DECISION MAKING:  Stable/uncomplicated   EVALUATION COMPLEXITY: Low     GOALS: Goals reviewed with patient? No   SHORT TERM GOALS:   STG Name Target Date Goal status  1 Pt will be compliant and knowledgeable with initial HEP for improved comfort and carryover Baseline: initial HEP given 06/25/2021 Met 06/18/21    LONG TERM GOALS:    LTG Name Target Date Goal status  1 Pt will decrease Quick DASH disability score to no greater than 5% as proxy for functional improvement Baseline: 20% disability 07/30/2021 Unable to Assess 06/21/21  2 Pt will self report bilateral shoulder pain no greater than 0/10 for improved comfort and functional ability Baseline: 3/10 at worst; status 06/21/21: no pain since starting PT 07/30/2021 Ongoing 06/21/21  3 Pt will be able to lift 25lb KB overhead with no increase in bilateral shoulder pain in order to improve comfort with work related duties Baseline: unable 07/30/2021 Met 06/21/21  4 Pt will be knowledgeable of advanced HEP for continued improvement post d/c from skilled PT Baseline: status: 06/21/21:  performing current HEP 07/30/2021 Ongoing 06/21/21    PLAN: PT FREQUENCY: 1-2x/week   PT DURATION: 8 weeks   PLANNED INTERVENTIONS: Therapeutic exercises, Therapeutic activity, Neuro Muscular re-education, Balance training, Gait training, Patient/Family education, Joint mobilization, Dry Needling, Spinal mobilization, Cryotherapy, Moist heat, and Manual therapy   PLAN FOR NEXT SESSION: check LTG progression; assess HEP response, progress RTC and periscapular strengthening as able, continue lifting progression.      Hessie Diener, PTA 06/21/21 8:39 AM Phone: [phone] Fax: [phone]

## 2021-06-23 ENCOUNTER — Encounter: Payer: Self-pay | Admitting: Cardiology

## 2021-06-25 ENCOUNTER — Ambulatory Visit: Payer: Medicaid Other

## 2021-06-25 ENCOUNTER — Telehealth (HOSPITAL_COMMUNITY): Payer: Self-pay | Admitting: *Deleted

## 2021-06-25 NOTE — Telephone Encounter (Signed)
Spoke with patient regarding chest pain. At this time, he is not having chest pain and said yesterday was a good day. He also said if he stopped thinking about his chest pain, he does ok. He said he will contact us and go to the ER if he feels chest pain come back without any relief. We went over the CTA patient guidelines. I informed him that staff from radiology should contact him today to review the guidelines as well, and that he could ask them any questions he may have.

## 2021-06-25 NOTE — Telephone Encounter (Signed)
Attempted to call patient regarding upcoming cardiac CT appointment. °Left message on voicemail with name and callback number ° °Almas Rake RN Navigator Cardiac Imaging °[facility] °[phone] Office °[phone] Cell ° °

## 2021-06-25 NOTE — Telephone Encounter (Signed)
Yes proceed with CTA - this should answer a lot of questions  Christopher Weathington [place] MD, [facility]

## 2021-06-25 NOTE — Telephone Encounter (Signed)
Patient returning call regarding upcoming cardiac imaging study; pt verbalizes understanding of appt date/time, parking situation and where to check in, pre-test NPO status and medications ordered, and verified current allergies; name and call back number provided for further questions should they arise  Christopher Brick RN Navigator Cardiac Imaging [facility] [phone] office [phone] cell  Patient to take 100mg  metoprolol tartrate two hours prior to cardiac CT scan. He is aware to arrive at 8am for his 8:30am scan.

## 2021-06-26 ENCOUNTER — Ambulatory Visit (HOSPITAL_COMMUNITY)
Admission: RE | Admit: 2021-06-26 | Discharge: 2021-06-26 | Disposition: A | Discharge status: Home / Self Care | Payer: Medicaid Other | Source: Ambulatory Visit | Attending: Cardiology | Admitting: Cardiology

## 2021-06-26 ENCOUNTER — Other Ambulatory Visit: Payer: Self-pay

## 2021-06-26 DIAGNOSIS — R072 Precordial pain: Secondary | ICD-10-CM | POA: Insufficient documentation

## 2021-06-26 DIAGNOSIS — E782 Mixed hyperlipidemia: Secondary | ICD-10-CM | POA: Insufficient documentation

## 2021-06-26 DIAGNOSIS — E109 Type 1 diabetes mellitus without complications: Secondary | ICD-10-CM | POA: Insufficient documentation

## 2021-06-26 DIAGNOSIS — I10 Essential (primary) hypertension: Secondary | ICD-10-CM | POA: Diagnosis present

## 2021-06-26 DIAGNOSIS — R079 Chest pain, unspecified: Secondary | ICD-10-CM | POA: Insufficient documentation

## 2021-06-26 IMAGING — CT CT HEART MORP W/ CTA COR W/ SCORE W/ CA W/CM &/OR W/O CM
4 of 7 series · 9 of 20 positions shown, 10 images · non-contrast
Comparison: CTA Chest [DATE]
COMPARISON: CTA Chest [DATE]

Addendum:
EXAM:
OVER-READ INTERPRETATION  CT CHEST

The following report is an over-read performed by radiologist Dr.
over-read does not include interpretation of cardiac or coronary
anatomy or pathology. The coronary CTA interpretation by the
cardiologist is attached.
CLINICAL DATA: This is a 45 year old male with chest pain.
Cardiac/Coronary  CTA
TECHNIQUE: The patient was scanned on a [employer] Force scanner.

[Series 6: best diast 70 % · axial · 0.48mm/px · z∈[-172,-92]mm · 3 of 402 slices shown, 4 images]
[im 101/402  vessel]
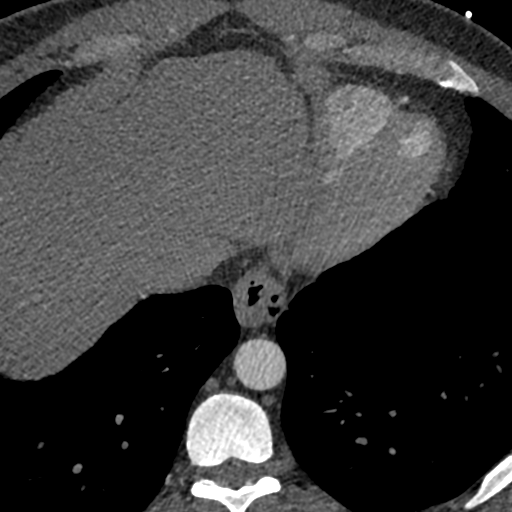
[im 101/402  lung]
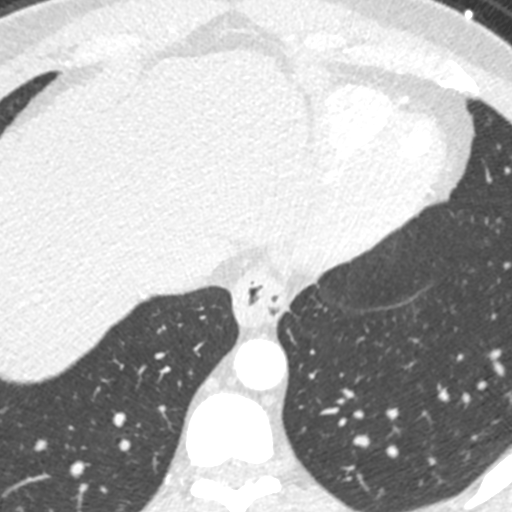
[im 201/402  vessel]
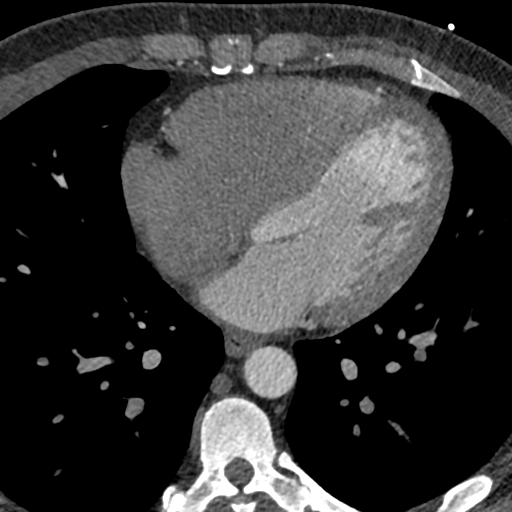
[im 301/402  vessel]
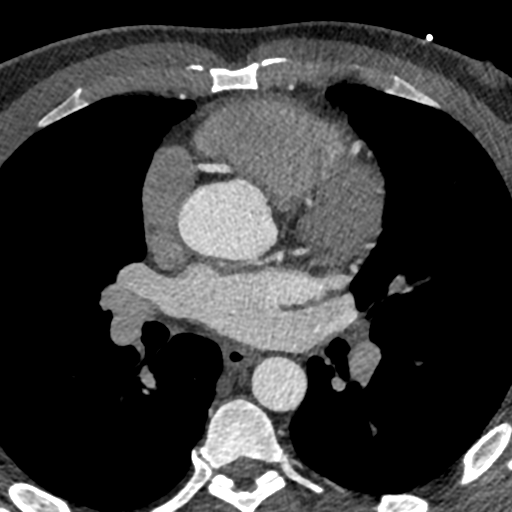

[Series 7: best syst 36 % · axial · 0.48mm/px · z∈[-147,-99]mm · 2 of 357 slices shown]
[im 119/357  vessel]
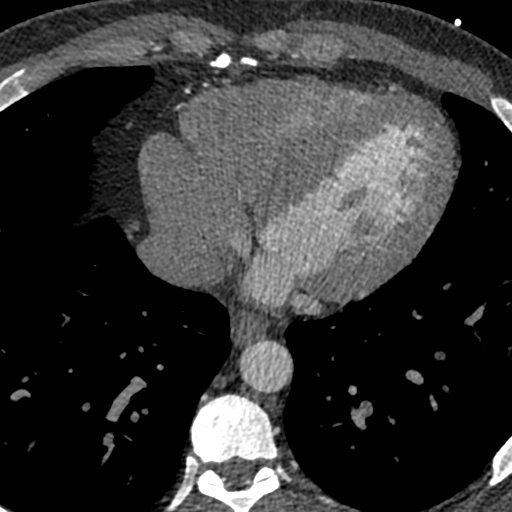
[im 238/357  vessel]
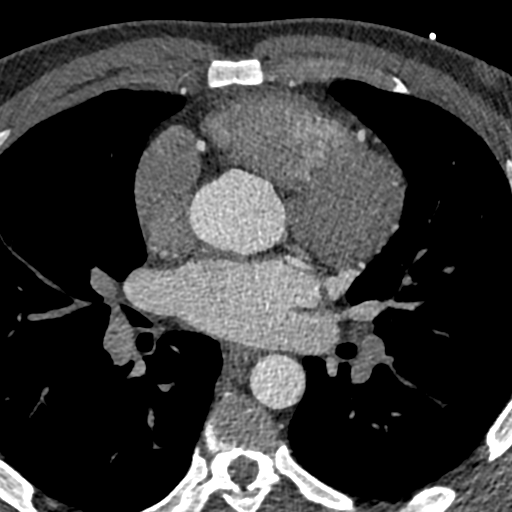

[Series 8: ts diast sharp 70 % · axial · 0.48mm/px · z∈[-147,-99]mm · 2 of 357 slices shown]
[im 119/357  lung]
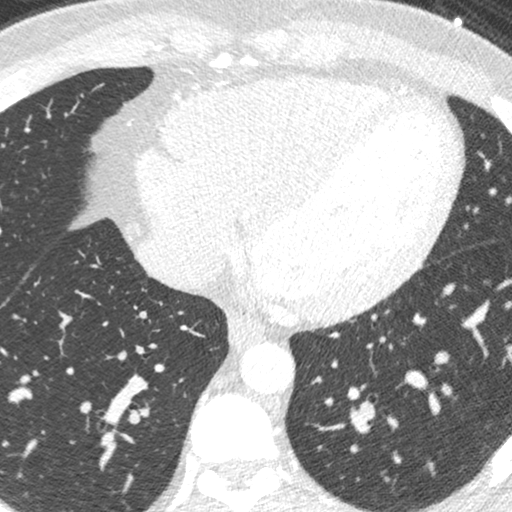
[im 238/357  lung]
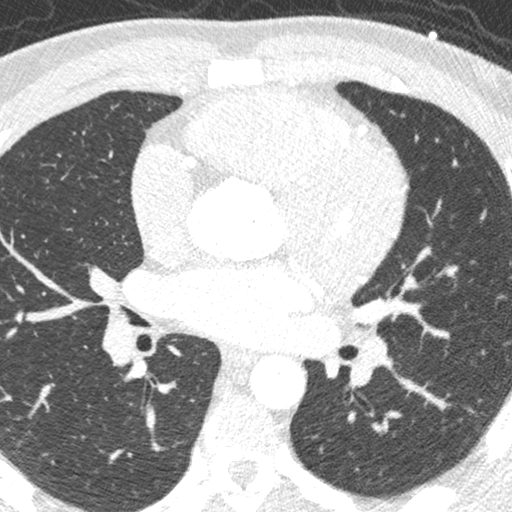

[Series 9: ts syst sharp 36 % · axial · 0.48mm/px · z∈[-147,-99]mm · 2 of 357 slices shown]
[im 119/357  lung]
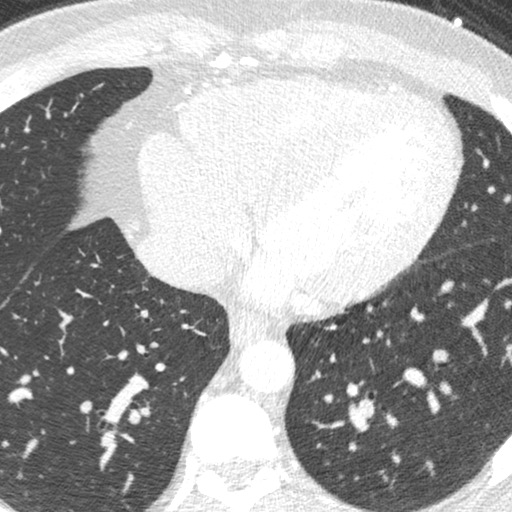
[im 238/357  lung]
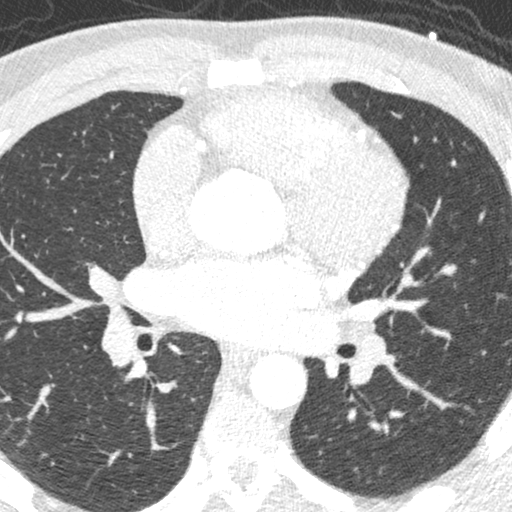

[9 of 20 positions shown; findings below may reference images not displayed]

FINDINGS: Vascular: No dissection flap within the visualized thoracic aorta.
No large central pulmonary embolus.

Mediastinum/Nodes: Visualized mediastinum and hilar regions
demonstrate no lymphadenopathy.

Lungs/Pleura: No suspicious pulmonary nodule or mass. No evidence
for focal consolidation or pleural effusion.

Upper Abdomen: Unremarkable.

Musculoskeletal: No worrisome lytic or sclerotic osseous
abnormality.
IMPRESSION: No acute or clinically significant extracardiac findings.
FINDINGS: A 100 kV prospective scan was triggered in the descending thoracic
aorta at 111 HU's. Axial non-contrast 3 mm slices were carried out
through the heart. The data set was analyzed on a dedicated work
station and scored using the Agatson method. Gantry rotation speed
was 250 msecs and collimation was .6 mm. No beta blockade and 0.8 mg
of sl NTG was given. The 3D data set was reconstructed in 5%
intervals of the 67-82 % of the R-R cycle. Diastolic phases were
analyzed on a dedicated work station using MPR, MIP and VRT modes.
The patient received 80 cc of contrast.

Aorta: Normal size.  No calcifications.  No dissection.

Aortic Valve:  Trileaflet.  No calcifications.

Coronary Arteries:  Normal coronary origin.  Right dominance.

RCA is a large dominant artery that gives rise to PDA and PLA. There
is no plaque.

Left main is a large artery that gives rise to LAD, Intermedius
Ramus and LCX arteries.

LAD is a large vessel that has no plaque.

LCX is a non-dominant artery that gives rise to one large OM1
branch. There is no plaque.

Coronary Calcium Score:

Left main: 0

Left anterior descending artery: 0

Left circumflex artery: 0

Right coronary artery: 0

Total: 0

Percentile: 0

Other findings:

Normal pulmonary vein drainage into the left atrium.

Normal left atrial appendage without a thrombus.

Normal size of the pulmonary artery.
IMPRESSION: 1. Coronary calcium score of 0. This was 0 percentile for age and
sex matched control.

2. Normal coronary origin with right dominance.

3. No evidence of CAD. CAD-RADS 0. No evidence of CAD (0%). Consider
non-atherosclerotic causes of chest pain.

*** End of Addendum ***
EXAM:
OVER-READ INTERPRETATION  CT CHEST

The following report is an over-read performed by radiologist Dr.
over-read does not include interpretation of cardiac or coronary
anatomy or pathology. The coronary CTA interpretation by the
cardiologist is attached.
FINDINGS: Vascular: No dissection flap within the visualized thoracic aorta.
No large central pulmonary embolus.

Mediastinum/Nodes: Visualized mediastinum and hilar regions
demonstrate no lymphadenopathy.

Lungs/Pleura: No suspicious pulmonary nodule or mass. No evidence
for focal consolidation or pleural effusion.

Upper Abdomen: Unremarkable.

Musculoskeletal: No worrisome lytic or sclerotic osseous
abnormality.
IMPRESSION: No acute or clinically significant extracardiac findings.

## 2021-06-26 MED ORDER — DIPHENHYDRAMINE HCL 50 MG/ML IJ SOLN
INTRAMUSCULAR | Status: AC
  Filled 2021-06-26: qty 1

## 2021-06-26 MED ORDER — NITROGLYCERIN 0.4 MG SL SUBL
0.8000 mg | SUBLINGUAL_TABLET | Freq: Once | SUBLINGUAL | Status: AC
  Administered 2021-06-26: 0.8 mg via SUBLINGUAL

## 2021-06-26 MED ORDER — METOPROLOL TARTRATE 5 MG/5ML IV SOLN
10.0000 mg | INTRAVENOUS | Status: DC | PRN
  Administered 2021-06-26 (×2): 10 mg via INTRAVENOUS

## 2021-06-26 MED ORDER — DILTIAZEM HCL 25 MG/5ML IV SOLN
10.0000 mg | Freq: Once | INTRAVENOUS | Status: AC
  Administered 2021-06-26: 10 mg via INTRAVENOUS

## 2021-06-26 MED ORDER — DILTIAZEM HCL 25 MG/5ML IV SOLN
INTRAVENOUS | Status: AC
  Administered 2021-06-26: 10 mg via INTRAVENOUS
  Filled 2021-06-26: qty 5

## 2021-06-26 MED ORDER — METOPROLOL TARTRATE 5 MG/5ML IV SOLN
INTRAVENOUS | Status: AC
  Filled 2021-06-26: qty 10

## 2021-06-26 MED ORDER — IOHEXOL 350 MG/ML SOLN
100.0000 mL | Freq: Once | INTRAVENOUS | Status: AC | PRN
  Administered 2021-06-26: 95 mL via INTRAVENOUS

## 2021-06-26 MED ORDER — NITROGLYCERIN 0.4 MG SL SUBL
SUBLINGUAL_TABLET | SUBLINGUAL | Status: AC
  Filled 2021-06-26: qty 2

## 2021-06-26 MED ORDER — DILTIAZEM HCL 25 MG/5ML IV SOLN: 10.0000 mg | Freq: Once | INTRAVENOUS | Status: AC

## 2021-06-26 MED ORDER — METOPROLOL TARTRATE 5 MG/5ML IV SOLN
INTRAVENOUS | Status: AC
  Administered 2021-06-26: 10 mg via INTRAVENOUS
  Filled 2021-06-26: qty 20

## 2021-06-28 ENCOUNTER — Ambulatory Visit: Payer: Medicaid Other | Admitting: Physical Therapy

## 2021-07-02 ENCOUNTER — Encounter: Payer: Medicaid Other | Admitting: Physical Therapy

## 2021-07-02 ENCOUNTER — Other Ambulatory Visit: Payer: Self-pay

## 2021-07-02 ENCOUNTER — Ambulatory Visit: Payer: Medicaid Other | Attending: Orthopaedic Surgery

## 2021-07-02 DIAGNOSIS — G8929 Other chronic pain: Secondary | ICD-10-CM | POA: Diagnosis present

## 2021-07-02 DIAGNOSIS — M25511 Pain in right shoulder: Secondary | ICD-10-CM | POA: Insufficient documentation

## 2021-07-02 DIAGNOSIS — M25512 Pain in left shoulder: Secondary | ICD-10-CM | POA: Diagnosis present

## 2021-07-02 NOTE — Therapy (Signed)
OUTPATIENT PHYSICAL THERAPY TREATMENT NOTE   Patient Name: Christopher Abbott MRN: [id number] DOB:1975/08/12, 46 y.o., male Today's Date: 07/02/2021  PCP: Tomasita Morrow, MD REFERRING PROVIDER: Tomasita Morrow, MD   PT End of Session - 07/02/21 [phone]     Visit Number 6    Number of Visits 15    Date for PT Re-Evaluation 07/30/21    Authorization Type [employer]- pending    Authorization Time Period 06/11/21-07/24/21 12 visits    Authorization - Visit Number 5    Authorization - Number of Visits 12    PT Start Time 0745    PT Stop Time 0823    PT Time Calculation (min) 38 min             Past Medical History:  Diagnosis Date   Diabetes mellitus    type 2   GERD (gastroesophageal reflux disease)    Hyperlipidemia    Hypertension    Past Surgical History:  Procedure Laterality Date   ACHILLES TENDON SURGERY Left 07/18/2015   Procedure: LEFT ACHILLES TENDON DIRECT PRIMARY REPAIR;  Surgeon: Mcarthur Rossetti, MD;  Location: [place];  Service: Orthopedics;  Laterality: Left;   COLONOSCOPY WITH PROPOFOL N/A 08/14/2017   Procedure: COLONOSCOPY WITH PROPOFOL;  Surgeon: Lin Landsman, MD;  Location: [facility] ENDOSCOPY;  Service: Gastroenterology;  Laterality: N/A;   NO PAST SURGERIES     Patient Active Problem List   Diagnosis Date Noted   Internal hemorrhoid, bleeding 08/29/2017   Type 2 diabetes mellitus ([place]) 08/06/2017   Achilles rupture, left 07/18/2015   Rupture of left Achilles tendon 07/18/2015  Referring Provider: Mcarthur Rossetti   REFERRING DIAG: M25.511,G89.29,M25.512 (ICD-10-CM) - Chronic pain of both shoulders  THERAPY DIAG: Chronic Pain of both shoulders    PERTINENT HISTORY: Chronic bilateral shoulder pain, especially with overhead motions  PRECAUTIONS: None   WEIGHT BEARING RESTRICTIONS: No  SUBJECTIVE:  Pt presents to PT with no current reports of chest tightness or shoulder pain. Had a cardiac CT last week with no abnormal findings. He  has been taking it easy due to pending cardiac last week. Pt is ready to begin PT at this time.   Pain: Are you having pain? No NPRS: 0/10 Pain location: bilateral shoulder PAIN TYPE: aching and numbness Pain description: intermittent  Aggravating factors: certain lifting motions Relieving factors: medication  OBJECTIVE:    PATIENT SURVEYS:  Quick Dash 20% disability   COGNITION:          Overall cognitive status: Within functional limits for tasks assessed                               SENSATION:          Light touch: Appears intact          Stereognosis: Appears intact          Hot/Cold: Appears intact          Proprioception: Appears intact   UPPER EXTREMITY AROM/PROM:                     WNL all bilateral UE motions   UPPER EXTREMITY MMT:   MMT Right 06/04/2021 Left 06/04/2021 Right  06/21/21 Left  06/21/21  Shoulder flexion 5/5 5/5 5/5 5/5  Shoulder abduction (C5) 5/5 4/5 4/5 P! 4+/5  Shoulder ER 4/5 4/5 5/5 5/5  Shoulder IR 5/5 4/5 5/5 5/5  Middle trapezius  Lower trapezius        Shoulder extension        Grip strength        Cervical flexion (C1,C2)        Cervical S/B (C3)        Shoulder shrug (C4)        Elbow flexion (C6)        Elbow ext (C7)        Thumb ext (C8)        Finger abd (T1)          (Blank rows = not tested, score listed is out of 5 possible points.  N = WNL, D = diminished, C = clear for gross weakness with myotome testing, * = concordant pain with testing)    SHOULDER SPECIAL TESTS:           Impingement tests: Neer impingement test: negative and Hawkins/Kennedy impingement test: negative           SLAP lesions:  N/A           Instability tests:  N/A           Rotator cuff assessment: Drop arm test: negative and Empty can test: negative           Biceps assessment: N/A   JOINT MOBILITY TESTING:  N/A   PALPATION:  TTP to bilateral subscapularis muscle insertion, AC joint   POSTURE:  Medium body habitus; rounded shoulders             TODAY'S TREATMENT: [facility] Adult PT Treatment:                                                DATE: 07/02/2021 Therapeutic Exercise: UBE level 3.0 2 min each way Seated Row 40# 4x12 Lat pulldown 4x15 40# Shoulder extension 20# 2x15 Body blade IR/ER elbow bent 2x30" Body blad flex/ext w/ shoulder flexed to 90 x 30" ea Corner stretch x 30" x 2 Shoulder IR 10# 3x10 bilat Shoulder ER 7# 2x10 bilat Standing single arm horizontal abd 2x10 ea RTB at wall  25# overhead press using bilateral UE x 3 sets of 10 Manual Therapy: N/A Neuromuscular re-ed: N/A Therapeutic Activity: N/A Modalities: N/A Self Care: N/A   Therapeutic Exercise: 06/21/21 UBE level 3.0 3 min each way Row x 34# 20 x 3 (free motion)  Shoulder extension 26#  15 x 2  Lateral raise 4# 15 x 2 , green band 1 set bilat x 10 Corner stretch x 30" x 2- felt left chest pulling Shoulder IR 10# 20 x 2 bilat  25# overhead press using bilateral UE x 3 sets of 10   Therapeutic Exercise: 06/18/21 UBE level 3.0 3 min each way.  Row x 34# 20 x 3 (free motion)  Shoulder extension 26#  15 x 2  Corner stretch x 30" Bilateral ER w/ scap retrac 20 x 3  BTB Shoulder IR 10# 20 x 2 bilat  Standing  horizontal abduction x 20 green x2 Standing star pattern green band 2 x 10 15# overhead press using bilateral UE x 3 sets of 10     PATIENT EDUCATION: Education details: continue HEP Person educated: Patient Education method: Explanation, Demonstration, and Handouts Education comprehension: verbalized understanding and returned demonstration     HOME EXERCISE PROGRAM: Access Code: [email or web address]   ASSESSMENT:  CLINICAL IMPRESSION: Pt was able to complete all prescribed exercises with no adverse effect or increase in pain. Therapy today focused on improving dynamic stabilizer strength of periscapular and RTC musculature. He shows improving muscular endurance with therapy, but does fatigue with higher rep counts. He continues to  benefit from skilled PT and will be seen and progressed as able.      GOALS: Goals reviewed with patient? No   SHORT TERM GOALS:   STG Name Target Date Goal status  1 Pt will be compliant and knowledgeable with initial HEP for improved comfort and carryover Baseline: initial HEP given 06/25/2021 Met 06/18/21    LONG TERM GOALS:    LTG Name Target Date Goal status  1 Pt will decrease Quick DASH disability score to no greater than 5% as proxy for functional improvement Baseline: 20% disability 07/30/2021 Unable to Assess 06/21/21  2 Pt will self report bilateral shoulder pain no greater than 0/10 for improved comfort and functional ability Baseline: 3/10 at worst; status 06/21/21: no pain since starting PT 07/30/2021 Ongoing 06/21/21  3 Pt will be able to lift 25lb KB overhead with no increase in bilateral shoulder pain in order to improve comfort with work related duties Baseline: unable 07/30/2021 Met 06/21/21  4 Pt will be knowledgeable of advanced HEP for continued improvement post d/c from skilled PT Baseline: status: 06/21/21:  performing current HEP 07/30/2021 Ongoing 06/21/21    PLAN: PT FREQUENCY: 1-2x/week   PT DURATION: 8 weeks   PLANNED INTERVENTIONS: Therapeutic exercises, Therapeutic activity, Neuro Muscular re-education, Balance training, Gait training, Patient/Family education, Joint mobilization, Dry Needling, Spinal mobilization, Cryotherapy, Moist heat, and Manual therapy   PLAN FOR NEXT SESSION: check LTG progression; assess HEP response, progress RTC and periscapular strengthening as able, continue lifting progression.    Ward Chatters, PT 07/02/21 8:24 AM

## 2021-07-05 ENCOUNTER — Other Ambulatory Visit: Payer: Self-pay

## 2021-07-05 ENCOUNTER — Encounter: Payer: Self-pay | Admitting: Physical Therapy

## 2021-07-05 ENCOUNTER — Ambulatory Visit: Payer: Medicaid Other | Admitting: Physical Therapy

## 2021-07-05 DIAGNOSIS — M25511 Pain in right shoulder: Secondary | ICD-10-CM

## 2021-07-05 DIAGNOSIS — G8929 Other chronic pain: Secondary | ICD-10-CM

## 2021-07-05 NOTE — Therapy (Addendum)
OUTPATIENT PHYSICAL THERAPY TREATMENT NOTE/DISCHARGE   Patient Name: Christopher Abbott MRN: [id number] DOB:1976-02-09, 46 y.o., male Today's Date: 07/05/2021  PCP: Tomasita Morrow, MD REFERRING PROVIDER: Mcarthur Rossetti*   PT End of Session - 07/05/21 0803     Visit Number 7    Number of Visits 15    Date for PT Re-Evaluation 07/30/21    Authorization Type [employer]- pending    Authorization Time Period 06/11/21-07/24/21 12 visits    Authorization - Visit Number 6    Authorization - Number of Visits 12    PT Start Time 0800    PT Stop Time 2505    PT Time Calculation (min) 38 min             Past Medical History:  Diagnosis Date   Diabetes mellitus    type 2   GERD (gastroesophageal reflux disease)    Hyperlipidemia    Hypertension    Past Surgical History:  Procedure Laterality Date   ACHILLES TENDON SURGERY Left 07/18/2015   Procedure: LEFT ACHILLES TENDON DIRECT PRIMARY REPAIR;  Surgeon: Mcarthur Rossetti, MD;  Location: [place];  Service: Orthopedics;  Laterality: Left;   COLONOSCOPY WITH PROPOFOL N/A 08/14/2017   Procedure: COLONOSCOPY WITH PROPOFOL;  Surgeon: Lin Landsman, MD;  Location: [facility] ENDOSCOPY;  Service: Gastroenterology;  Laterality: N/A;   NO PAST SURGERIES     Patient Active Problem List   Diagnosis Date Noted   Internal hemorrhoid, bleeding 08/29/2017   Type 2 diabetes mellitus ([place]) 08/06/2017   Achilles rupture, left 07/18/2015   Rupture of left Achilles tendon 07/18/2015  Referring Provider: Mcarthur Rossetti   REFERRING DIAG: M25.511,G89.29,M25.512 (ICD-10-CM) - Chronic pain of both shoulders  THERAPY DIAG: Chronic Pain of both shoulders    PERTINENT HISTORY: Chronic bilateral shoulder pain, especially with overhead motions  PRECAUTIONS: None   WEIGHT BEARING RESTRICTIONS: No  SUBJECTIVE:  Pt reports he had some discomfort in chest this morning that has resolved after taking anxiety meds.   Pain: Are you  having pain? No NPRS: 0/10 Pain location: bilateral shoulder PAIN TYPE: aching and numbness Pain description: intermittent  Aggravating factors: certain lifting motions Relieving factors: medication  OBJECTIVE:    PATIENT SURVEYS:  Quick Dash 20% disability, 07/05/21: 11%   COGNITION:          Overall cognitive status: Within functional limits for tasks assessed                               SENSATION:          Light touch: Appears intact          Stereognosis: Appears intact          Hot/Cold: Appears intact          Proprioception: Appears intact   UPPER EXTREMITY AROM/PROM:                     WNL all bilateral UE motions   UPPER EXTREMITY MMT:   MMT Right 06/04/2021 Left 06/04/2021 Right  06/21/21 Left  06/21/21 Right 2/9/263 Left 07/05/21  Shoulder flexion 5/5 5/5 5/5 5/5 5/5 5/5  Shoulder abduction (C5) 5/5 4/5 4/5 P! 4+/5 5/5 5/5  Shoulder ER 4/5 4/5 5/5 5/5 5/5 5/5  Shoulder IR 5/5 4/5 5/5 5/5 5/5 5/5  Middle trapezius          Lower trapezius  Shoulder extension          Grip strength          Cervical flexion (C1,C2)          Cervical S/B (C3)          Shoulder shrug (C4)          Elbow flexion (C6)          Elbow ext (C7)          Thumb ext (C8)          Finger abd (T1)            (Blank rows = not tested, score listed is out of 5 possible points.  N = WNL, D = diminished, C = clear for gross weakness with myotome testing, * = concordant pain with testing)    SHOULDER SPECIAL TESTS:           Impingement tests: Neer impingement test: negative and Hawkins/Kennedy impingement test: negative           SLAP lesions:  N/A           Instability tests:  N/A           Rotator cuff assessment: Drop arm test: negative and Empty can test: negative           Biceps assessment: N/A   JOINT MOBILITY TESTING:  N/A   PALPATION:  TTP to bilateral subscapularis muscle insertion, AC joint   POSTURE:  Medium body habitus; rounded shoulders            TODAY'S  TREATMENT:  [facility] Adult PT Treatment:                                                DATE: 07/05/2021 Therapeutic Exercise: UBE level 3.0 2 min each way Seated Row 45# 4x12 Lat pulldown 4x15 40# Shoulder extension 35# 2x15 Shoulder IR 10# 3x10 bilat Shoulder ER 7# 2x10 bilat Standing star pattern GTB x 15 25# overhead press using bilateral UE x 3 sets of 10    [facility] Adult PT Treatment:                                                DATE: 07/02/2021 Therapeutic Exercise: UBE level 3.0 2 min each way Seated Row 40# 4x12 Lat pulldown 4x15 40# Shoulder extension 20# 2x15 Body blade IR/ER elbow bent 2x30" Body blad flex/ext w/ shoulder flexed to 90 x 30" ea Corner stretch x 30" x 2 Shoulder IR 10# 3x10 bilat Shoulder ER 7# 2x10 bilat Standing single arm horizontal abduction RTB 10 x 2  25# overhead press using bilateral UE x 3 sets of 10   Therapeutic Exercise: 06/21/21 UBE level 3.0 3 min each way Row x 34# 20 x 3 (free motion)  Shoulder extension 26#  15 x 2  Lateral raise 4# 15 x 2 , green band 1 set bilat x 10 Corner stretch x 30" x 2- felt left chest pulling Shoulder IR 10# 20 x 2 bilat  25# overhead press using bilateral UE x 3 sets of 10       PATIENT EDUCATION: Education details: continue HEP Person educated: Patient Education method: Explanation, Demonstration, and  Handouts Education comprehension: verbalized understanding and returned demonstration     HOME EXERCISE PROGRAM: Access Code: [email or web address]   ASSESSMENT:   CLINICAL IMPRESSION: Pt arrives reporting min chest tightness this morning that resolved with medication. He has had no pain over the last 2 weeks in bilateral shoulders other than some discomfort when a child was laying on his arm for a prolonged period. His shoulder MMT has improved to 5/5 without pain upon testing. He is independent with his HEP and gym machines where he feels comfortable discharging today from Physical Therapy. 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 He will F/U with MD and if he has any difficulties with RTW he will request a referral back to PT.       GOALS: Goals reviewed with patient? No   SHORT TERM GOALS:   STG Name Target Date Goal status  1 Pt will be compliant and knowledgeable with initial HEP for improved comfort and carryover Baseline: initial HEP given 06/25/2021 Met 06/18/21    LONG TERM GOALS:    LTG Name Target Date Goal status  1 Pt will decrease Quick DASH disability score to no greater than 5% as proxy for functional improvement Baseline: 20% disability; status 07/05/21 11% 07/30/2021 Met  07/05/21  2 Pt will self report bilateral shoulder pain no greater than 0/10 for improved comfort and functional ability Baseline: 3/10 at worst; status 06/21/21: no pain since starting PT; 07/05/21: no pain , some discomfort with infant laying on his shoulder 07/30/2021 Met  07/05/21  3 Pt will be able to lift 25lb KB overhead with no increase in bilateral shoulder pain in order to improve comfort with work related duties Baseline: unable 07/30/2021 Met 06/21/21  4 Pt will be knowledgeable of advanced HEP for continued improvement post d/c from skilled PT Baseline: status: 06/21/21:  performing current HEP: 07/05/21: independent with advanced HEP and knows gym machines 07/30/2021 Met  07/05/21    PLAN: PT FREQUENCY: 1-2x/week   PT DURATION: 8 weeks   PLANNED INTERVENTIONS: Therapeutic exercises, Therapeutic activity, Neuro Muscular re-education, Balance training, Gait training, Patient/Family education, Joint mobilization, Dry Needling, Spinal mobilization, Cryotherapy, Moist heat, and Manual therapy   PLAN FOR NEXT SESSION: N/A Discharge to HEP today.  Hessie Diener, PTA 07/05/21 8:45 AM Phone: [phone] Fax: [phone]   PHYSICAL THERAPY DISCHARGE SUMMARY  Visits from Start of Care: 7  Current functional level related to goals / functional outcomes: See  goals and objective   Remaining deficits: See goals, objective, and assessment   Education / Equipment: HEP   Patient agrees to discharge. Patient goals were met. Patient is being discharged due to meeting the stated rehab goals.

## 2021-12-07 ENCOUNTER — Encounter: Payer: Self-pay | Admitting: Gastroenterology

## 2022-01-08 ENCOUNTER — Ambulatory Visit (INDEPENDENT_AMBULATORY_CARE_PROVIDER_SITE_OTHER): Payer: Medicaid Other | Admitting: Gastroenterology

## 2022-01-08 ENCOUNTER — Encounter: Payer: Self-pay | Admitting: Gastroenterology

## 2022-01-08 VITALS — BP 130/78 | HR 92 | Ht 76.0 in | Wt 273.0 lb

## 2022-01-08 DIAGNOSIS — R0989 Other specified symptoms and signs involving the circulatory and respiratory systems: Secondary | ICD-10-CM

## 2022-01-08 DIAGNOSIS — K219 Gastro-esophageal reflux disease without esophagitis: Secondary | ICD-10-CM

## 2022-01-08 DIAGNOSIS — R1310 Dysphagia, unspecified: Secondary | ICD-10-CM

## 2022-01-08 DIAGNOSIS — R1319 Other dysphagia: Secondary | ICD-10-CM

## 2022-01-08 NOTE — Progress Notes (Unsigned)
HPI : Christopher Abbott is a very pleasant 46 year old male with a history of diabetes who is referred to [place] by Dr. Beverlyn Roux for further evaluation of dysphagia.  The patient was a difficult historian, and it was difficult to get a very clear picture of exactly what his symptoms are.  Initially, his symptoms seem consistent with solid dysphagia, but then later he describes symptoms that are more consistent with a globus sensation. He describes sensation of food sitting in his throat which is initiated sometimes with swallowing, but also can just occur.  He reports that when he has an sensation he is able to continue eating and drinking, but that sometimes the food comes back up.  When the food comes back up, does not taste acidic or like it has been in the stomach. He does have symptoms of heartburn and acid regurgitation which she has dealt with for many years.  It seems that the symptoms are well controlled with PPI.  He was previously taking Nexium, but was recently switched to [place].  He thinks switching to Dexilant has included his dysphagia/globus sensation.  His typical reflux symptoms are also well controlled with Dexilant. He denied any symptoms of having food getting stuck and being unable to get the food to go down or having to forcefully vomit it back up.  When asked where he feels like the food is getting stuck or held up he points to his supraclavicular notch.  He says he has noticed this symptom more with burgers, hot dogs and fries.  He has not noticed it with chicken or fish.  His weight has been stable.  He denies symptoms of abdominal pain, early satiety or nausea/vomiting. He has no lower chronic GI symptoms.  He had a colonoscopy in 2019 to evaluate hematochezia.  He has never had an upper endoscopy.    Past Medical History:  Diagnosis Date   Diabetes mellitus    type 2   GERD (gastroesophageal reflux disease)    Hyperlipidemia    Hypertension      Past Surgical  History:  Procedure Laterality Date   ACHILLES TENDON SURGERY Left 07/18/2015   Procedure: LEFT ACHILLES TENDON DIRECT PRIMARY REPAIR;  Surgeon: Mcarthur Rossetti, MD;  Location: [place];  Service: Orthopedics;  Laterality: Left;   COLONOSCOPY WITH PROPOFOL N/A 08/14/2017   Procedure: COLONOSCOPY WITH PROPOFOL;  Surgeon: Lin Landsman, MD;  Location: [place] ENDOSCOPY;  Service: Gastroenterology;  Laterality: N/A;   NO PAST SURGERIES     Family History  Problem Relation Age of Onset   Diabetes Mother    Diabetes Father    Diabetes Sister    Colon cancer Neg Hx    Stomach cancer Neg Hx    Esophageal cancer Neg Hx    Colon polyps Neg Hx    Social History   Tobacco Use   Smoking status: Some Days    Types: Cigars   Smokeless tobacco: Never  Vaping Use   Vaping Use: Never used  Substance Use Topics   Alcohol use: Yes    Comment: none now, once per week or less   Drug use: No   Current Outpatient Medications  Medication Sig Dispense Refill   ACCU-CHEK FASTCLIX LANCETS MISC USE AS DIRECTED TO CHECK BLOOD SUAGR DX: E11.9  11   ACCU-CHEK GUIDE test strip USE AS DIRECTED TO TEST BLOOD SUGAR TWICE A DAY DX:E11.9  11   atorvastatin (LIPITOR) 40 MG tablet Take 40 mg by mouth at  bedtime.     B-D ULTRAFINE III SHORT PEN 31G X 8 MM MISC USE WITH INSULIN PEN INJECTIONS DAILY ( DX: E11.9)  11   Blood Glucose Monitoring Suppl (ACCU-CHEK GUIDE) w/Device KIT USE AS DIRECTED DX E11.9 (MEDICAID PREFERRED)  0   dexlansoprazole (DEXILANT) 60 MG capsule Take 1 capsule by mouth daily.     esomeprazole (NEXIUM) 20 MG capsule Take 20 mg by mouth daily at 12 noon.     HYDROcodone-acetaminophen (NORCO/VICODIN) 5-325 MG tablet Take 2 tablets by mouth every 4 (four) hours as needed. 10 tablet 0   insulin glargine (LANTUS) 100 UNIT/ML injection Inject 70 Units into the skin daily.      LANTUS SOLOSTAR 100 UNIT/ML Solostar Pen INJECT 45 UNITS INTO THE SKIN 4 TIMES A DAY  3   levocetirizine (XYZAL) 5 MG  tablet Take 1 tablet (5 mg total) by mouth every evening. 90 tablet 0   lisinopril-hydrochlorothiazide (PRINZIDE,ZESTORETIC) 20-12.5 MG tablet Take 1 tablet by mouth daily. for high blood pressure  5   meloxicam (MOBIC) 15 MG tablet Take 1 tablet (15 mg total) by mouth daily. 30 tablet 3   metFORMIN (GLUCOPHAGE) 1000 MG tablet Take 1,000 mg by mouth 2 (two) times daily with a meal.     pravastatin (PRAVACHOL) 40 MG tablet Take 40 mg by mouth every evening. for cholesterol  1   terbinafine (LAMISIL) 250 MG tablet Take 1 tablet (250 mg total) by mouth daily. 45 tablet 0   tiZANidine (ZANAFLEX) 4 MG tablet Take 1 tablet (4 mg total) by mouth at bedtime. 30 tablet 0   VICTOZA 18 MG/3ML SOPN SMARTSIG:0.3 Milliliter(s) SUB-Q Daily     Vitamin D, Ergocalciferol, (DRISDOL) 50000 units CAPS capsule TAKE ONE CAPSULE BY MOUTH ONCE A WEEK FOR 12 WEEKS  3   acetaminophen (TYLENOL) 325 MG tablet Take 2 tablets (650 mg total) by mouth every 6 (six) hours as needed for moderate pain. (Patient not taking: Reported on 01/08/2022) 30 tablet 0   benzonatate (TESSALON) 100 MG capsule Take 1-2 capsules (100-200 mg total) by mouth 3 (three) times daily as needed for cough. (Patient not taking: Reported on 01/08/2022) 60 capsule 0   hydrocortisone (ANUSOL-HC) 2.5 % rectal cream Apply rectally 2 times daily (Patient not taking: Reported on 06/12/2021) 28 g 2   metoprolol tartrate (LOPRESSOR) 100 MG tablet Take 100 mg 2 hours before Coronary CT (Patient not taking: Reported on 01/08/2022) 1 tablet 0   promethazine-dextromethorphan (PROMETHAZINE-DM) 6.25-15 MG/5ML syrup Take 5 mLs by mouth 4 (four) times daily as needed for cough. (Patient not taking: Reported on 01/08/2022) 300 mL 0   SUPREP BOWEL PREP KIT 17.5-3.13-1.6 GM/177ML SOLN See admin instructions. (Patient not taking: Reported on 01/08/2022)  0   No current facility-administered medications for this visit.   No Known Allergies   Review of Systems: All systems  reviewed and negative except where noted in HPI.    No results found.  Physical Exam: BP 130/78   Pulse 92   Ht 6' 4"  (1.93 m)   Wt 273 lb (123.8 kg)   BMI 33.23 kg/m  Constitutional: Pleasant,well-developed, African-American male in no acute distress. HEENT: Normocephalic and atraumatic. Conjunctivae are normal. No scleral icterus. Neck supple.  Cardiovascular: Normal rate, regular rhythm.  Pulmonary/chest: Effort normal and breath sounds normal. No wheezing, rales or rhonchi. Abdominal: Soft, nondistended, nontender. Bowel sounds active throughout. There are no masses palpable. No hepatomegaly. Extremities: no edema Lymphadenopathy: No cervical adenopathy noted. Neurological: Alert and  oriented to person place and time. Skin: Skin is warm and dry. No rashes noted. Psychiatric: Normal mood and affect. Behavior is normal.  CBC    Component Value Date/Time   WBC 5.3 08/06/2019 1555   RBC 5.24 08/06/2019 1555   HGB 15.1 08/06/2019 1555   HCT 46.2 08/06/2019 1555   PLT 458 (H) 08/06/2019 1555   MCV 88.2 08/06/2019 1555   MCH 28.8 08/06/2019 1555   MCHC 32.7 08/06/2019 1555   RDW 13.5 08/06/2019 1555   LYMPHSABS 1.6 09/01/2013 0545   MONOABS 1.0 09/01/2013 0545   EOSABS 0.2 09/01/2013 0545   BASOSABS 0.0 09/01/2013 0545    CMP     Component Value Date/Time   NA 142 06/19/2021 1632   K 4.0 06/19/2021 1632   CL 99 06/19/2021 1632   CO2 21 06/19/2021 1632   GLUCOSE 78 06/19/2021 1632   GLUCOSE 153 (H) 08/06/2019 1555   BUN 9 06/19/2021 1632   CREATININE 0.96 06/19/2021 1632   CALCIUM 9.7 06/19/2021 1632   GFRNONAA >60 08/06/2019 1555   GFRAA >60 08/06/2019 1555     ASSESSMENT AND PLAN: 46 year old male with chronic GERD symptoms, and recent onset of dysphagia/globus sensation.  No anemia or unintentional weight loss.  His typical GERD symptoms are well controlled with Dexilant, but he continues to have the dysphagia and globus sensation.  An upper endoscopy is  reasonable to evaluate for stricture/ring, as well as assess for evidence of reflux/esophagitis. If the EGD is unremarkable, I would recommend esophageal manometry and pH/impedance testing to further evaluate the symptoms, if the patient desires.  Solid dysphagia/globus - EGD - Continue Dexilant - Consider esophageal manometry/pH/impedance testing if EGD normal.  The details, risks (including bleeding, perforation, infection, missed lesions, medication reactions and possible hospitalization or surgery if complications occur), benefits, and alternatives to EGD with possible biopsy and possible dilation were discussed with the patient and he consents to proceed.   Jerrianne Hartin E. Candis Schatz, MD [facility]  CC:  Frazier Richards, MD

## 2022-01-08 NOTE — Patient Instructions (Addendum)
_______________________________________________________  If you are age 44 or older, your body mass index should be between 23-30. Your Body mass index is 33.23 kg/m. If this is out of the aforementioned range listed, please consider follow up with your Primary Care Provider.  If you are age 47 or younger, your body mass index should be between 19-25. Your Body mass index is 33.23 kg/m. If this is out of the aformentioned range listed, please consider follow up with your Primary Care Provider.   The [facility] providers would like to encourage you to use [facility] to communicate with providers for non-urgent requests or questions.  Due to long hold times on the telephone, sending your provider a message by [facility] may be a faster and more efficient way to get a response.  Please allow 48 business hours for a response.  Please remember that this is for non-urgent requests.   You have been scheduled for an endoscopy. Please follow written instructions given to you at your visit today. If you use inhalers (even only as needed), please bring them with you on the day of your procedure.   Due to recent changes in healthcare laws, you may see the results of your imaging and laboratory studies on MyChart before your provider has had a chance to review them.  We understand that in some cases there may be results that are confusing or concerning to you. Not all laboratory results come back in the same time frame and the provider may be waiting for multiple results in order to interpret others.  Please give [place] 48 hours in order for your provider to thoroughly review all the results before contacting the office for clarification of your results.    It was a pleasure to see you today!  Thank you for trusting me with your gastrointestinal care!    Scott E.Tomasa Rand, MD

## 2022-01-10 ENCOUNTER — Ambulatory Visit (AMBULATORY_SURGERY_CENTER): Payer: Medicaid Other | Admitting: Gastroenterology

## 2022-01-10 ENCOUNTER — Encounter: Payer: Self-pay | Admitting: Gastroenterology

## 2022-01-10 VITALS — BP 129/78 | HR 77 | Temp 98.6°F | Resp 16 | Ht 76.0 in | Wt 273.0 lb

## 2022-01-10 DIAGNOSIS — K219 Gastro-esophageal reflux disease without esophagitis: Secondary | ICD-10-CM

## 2022-01-10 DIAGNOSIS — B9681 Helicobacter pylori [H. pylori] as the cause of diseases classified elsewhere: Secondary | ICD-10-CM

## 2022-01-10 DIAGNOSIS — R1319 Other dysphagia: Secondary | ICD-10-CM

## 2022-01-10 DIAGNOSIS — K2950 Unspecified chronic gastritis without bleeding: Secondary | ICD-10-CM

## 2022-01-10 DIAGNOSIS — K2960 Other gastritis without bleeding: Secondary | ICD-10-CM | POA: Diagnosis not present

## 2022-01-10 DIAGNOSIS — K449 Diaphragmatic hernia without obstruction or gangrene: Secondary | ICD-10-CM | POA: Diagnosis not present

## 2022-01-10 DIAGNOSIS — R0989 Other specified symptoms and signs involving the circulatory and respiratory systems: Secondary | ICD-10-CM

## 2022-01-10 MED ORDER — SODIUM CHLORIDE 0.9 % IV SOLN: 500.0000 mL | Freq: Once | INTRAVENOUS | Status: DC

## 2022-01-10 NOTE — Progress Notes (Signed)
Called to room to assist during endoscopic procedure.  Patient ID and intended procedure confirmed with present staff. Received instructions for my participation in the procedure from the performing physician.  

## 2022-01-10 NOTE — Patient Instructions (Signed)
HANDOUTS PROVIDED ON: GERD DIET  The biopsies taken today have been sent for pathology.  The results can take 1-3 weeks to receive.   You may resume your previous diet and medication schedule.  Thank you for allowing [place] to care for you today!!!   YOU HAD AN ENDOSCOPIC PROCEDURE TODAY AT THE [facility]:   Refer to the procedure report that was given to you for any specific questions about what was found during the examination.  If the procedure report does not answer your questions, please call your gastroenterologist to clarify.  If you requested that your care partner not be given the details of your procedure findings, then the procedure report has been included in a sealed envelope for you to review at your convenience later.  YOU SHOULD EXPECT: Some feelings of bloating in the abdomen. Passage of more gas than usual.  Walking can help get rid of the air that was put into your GI tract during the procedure and reduce the bloating. If you had a lower endoscopy (such as a colonoscopy or flexible sigmoidoscopy) you may notice spotting of blood in your stool or on the toilet paper. If you underwent a bowel prep for your procedure, you may not have a normal bowel movement for a few days.  Please Note:  You might notice some irritation and congestion in your nose or some drainage.  This is from the oxygen used during your procedure.  There is no need for concern and it should clear up in a day or so.  SYMPTOMS TO REPORT IMMEDIATELY:  Following upper endoscopy (EGD)  Vomiting of blood or coffee ground material  New chest pain or pain under the shoulder blades  Painful or persistently difficult swallowing  New shortness of breath  Fever of 100F or higher  Black, tarry-looking stools  For urgent or emergent issues, a gastroenterologist can be reached at any hour by calling [phone]. Do not use MyChart messaging for urgent concerns.    DIET:  We do recommend a small meal at  first, but then you may proceed to your regular diet.  Drink plenty of fluids but you should avoid alcoholic beverages for 24 hours.  ACTIVITY:  You should plan to take it easy for the rest of today and you should NOT DRIVE or use heavy machinery until tomorrow (because of the sedation medicines used during the test).    FOLLOW UP: Our staff will call the number listed on your records the next business day following your procedure.  We will call around 7:15- 8:00 am to check on you and address any questions or concerns that you may have regarding the information given to you following your procedure. If we do not reach you, we will leave a message.  If you develop any symptoms (ie: fever, flu-like symptoms, shortness of breath, cough etc.) before then, please call [phone].  If you test positive for Covid 19 in the 2 weeks post procedure, please call and report this information to [place].    If any biopsies were taken you will be contacted by phone or by letter within the next 1-3 weeks.  Please call us at [phone] if you have not heard about the biopsies in 3 weeks.    SIGNATURES/CONFIDENTIALITY: You and/or your care partner have signed paperwork which will be entered into your electronic medical record.  These signatures attest to the fact that that the information above on your After Visit Summary has been reviewed  and is understood.  Full responsibility of the confidentiality of this discharge information lies with you and/or your care-partner.

## 2022-01-10 NOTE — Op Note (Signed)
[facility] Patient Name: Christopher Abbott Procedure Date: 01/10/2022 10:21 AM MRN: [id number] Endoscopist: Lorin Picket E. Tomasa Rand , MD Age: 46 Referring MD:  Date of Birth: [birth year] Gender: Male Account #: [id number] Procedure:                Upper GI endoscopy Indications:              Dysphagia, Globus sensation Medicines:                Monitored Anesthesia Care Procedure:                Pre-Anesthesia Assessment:                           - Prior to the procedure, a History and Physical                            was performed, and patient medications and                            allergies were reviewed. The patient's tolerance of                            previous anesthesia was also reviewed. The risks                            and benefits of the procedure and the sedation                            options and risks were discussed with the patient.                            All questions were answered, and informed consent                            was obtained. Prior Anticoagulants: The patient has                            taken no previous anticoagulant or antiplatelet                            agents. ASA Grade Assessment: II - A patient with                            mild systemic disease. After reviewing the risks                            and benefits, the patient was deemed in                            satisfactory condition to undergo the procedure.                           After obtaining informed consent, the endoscope was  passed under direct vision. Throughout the                            procedure, the patient's blood pressure, pulse, and                            oxygen saturations were monitored continuously. The                            Endoscope was introduced through the mouth, and                            advanced to the third part of duodenum. The upper                            GI endoscopy was  accomplished without difficulty.                            The patient tolerated the procedure well. Scope In: Scope Out: 10:37:24 AM Findings:                 The examined portions of the nasopharynx,                            oropharynx and larynx were normal.                           The examined esophagus was normal.                           The gastroesophageal flap valve was visualized                            endoscopically and classified as Hill Grade IV (no                            fold, wide open lumen, hiatal hernia present).                           A 2 cm hiatal hernia was present.                           Diffuse mildly erythematous mucosa was found in the                            gastric body. Biopsies were taken with a cold                            forceps for Helicobacter pylori testing. Estimated                            blood loss was minimal.                           The exam of the stomach  was otherwise normal.                           The examined duodenum was normal. Complications:            No immediate complications. Estimated Blood Loss:     Estimated blood loss was minimal. Impression:               - The examined portions of the nasopharynx,                            oropharynx and larynx were normal.                           - Normal esophagus.                           - Gastroesophageal flap valve classified as Hill                            Grade IV (no fold, wide open lumen, hiatal hernia                            present).                           - 2 cm hiatal hernia.                           - Erythematous mucosa in the gastric body. Biopsied.                           - Normal examined duodenum.                           - Patient's symptom are most likely related to                            reflux. Recommendation:           - Patient has a contact number available for                            emergencies. The signs and  symptoms of potential                            delayed complications were discussed with the                            patient. Return to normal activities tomorrow.                            Written discharge instructions were provided to the                            patient.                           -  Resume previous diet.                           - Continue present medications.                           - Await pathology results.                           - Consider esophageal manometry/pH/impedance to                            further investigate the role of GERD in his symptoms                           - Recommend anti-reflux diet and behavioral                            modifications (see handout). Hadiyah Maricle E. Tomasa Rand, MD 01/10/2022 10:43:21 AM This report has been signed electronically.

## 2022-01-10 NOTE — Progress Notes (Signed)
Sedate, gd SR, tolerated procedure well, VSS, report to RN 

## 2022-01-10 NOTE — Progress Notes (Signed)
Pt's states no medical or surgical changes since previsit or office visit. 

## 2022-01-10 NOTE — Progress Notes (Signed)
History and Physical Interval Note:  01/10/2022 10:23 AM  Christopher Abbott  has presented today for endoscopic procedure(s), with the diagnosis of  Encounter Diagnosis  Name Primary?   Esophageal dysphagia Yes  .  The various methods of evaluation and treatment have been discussed with the patient and/or family. After consideration of risks, benefits and other options for treatment, the patient has consented to  the endoscopic procedure(s).   The patient's history has been reviewed, patient examined, no change in status, stable for endoscopic procedure(s).  I have reviewed the patient's chart and labs.  Questions were answered to the patient's satisfaction.     Roman Dubuc E. Tomasa Rand, MD [facility]

## 2022-01-11 ENCOUNTER — Telehealth: Payer: Self-pay | Admitting: *Deleted

## 2022-01-11 NOTE — Telephone Encounter (Signed)
  Follow up Call-     01/10/2022    9:06 AM  Call back number  Post procedure Call Back phone  # [phone]  Permission to leave phone message Yes     Patient questions:  Do you have a fever, pain , or abdominal swelling? No. Pain Score  0 *  Have you tolerated food without any problems? Yes.    Have you been able to return to your normal activities? Yes.    Do you have any questions about your discharge instructions: Diet   No. Medications  No. Follow up visit  No.  Do you have questions or concerns about your Care? No.  Actions: * If pain score is 4 or above: No action needed, pain <4.

## 2022-01-16 ENCOUNTER — Other Ambulatory Visit: Payer: Self-pay

## 2022-01-16 ENCOUNTER — Telehealth: Payer: Self-pay | Admitting: Gastroenterology

## 2022-01-16 DIAGNOSIS — A048 Other specified bacterial intestinal infections: Secondary | ICD-10-CM

## 2022-01-16 MED ORDER — DOXYCYCLINE HYCLATE 100 MG PO CAPS: 100.0000 mg | ORAL_CAPSULE | Freq: Two times a day (BID) | ORAL | 0 refills | Status: DC

## 2022-01-16 MED ORDER — OMEPRAZOLE 20 MG PO CPDR: 20.0000 mg | DELAYED_RELEASE_CAPSULE | Freq: Two times a day (BID) | ORAL | 0 refills | Status: AC

## 2022-01-16 MED ORDER — BISMUTH 262 MG PO CHEW: 524.0000 mg | CHEWABLE_TABLET | Freq: Four times a day (QID) | ORAL | 0 refills | Status: DC

## 2022-01-16 MED ORDER — METRONIDAZOLE 250 MG PO TABS: 250.0000 mg | ORAL_TABLET | Freq: Four times a day (QID) | ORAL | 0 refills | Status: DC

## 2022-01-16 NOTE — Progress Notes (Signed)
Christopher Abbott,  The biopsies from the recent upper GI Endoscopy were notable for H. Pylori gastritis, and will plan on treating with quad therapy as below. Please confirm no medication allergies to the prescribed regimen.   1) Omeprazole 20 mg 2 times a day x 14 d 2) Pepto Bismol 2 tabs (262 mg each) 4 times a day x 14 d 3) Metronidazole 250 mg 4 times a day x 14 d 4) doxycycline 100 mg 2 times a day x 14 d  After 14 days, ok to stop omeprazole.  4 weeks after treatment completed, check H. Pylori stool antigen to confirm eradication (must be off acid suppression therapy (this includes his OTC Nexium) for 2 weeks prior to specimen submission)  Dx: Christopher Abbott Pylori gastritis  Christopher Abbott,  Please enter above prescriptions to the [facility] on [address] and order stool antigen test for 6 weeks from now.

## 2022-01-16 NOTE — Telephone Encounter (Signed)
Patient called, states he is diabetic, last procedure 8/17, states he needs something to call in for him to help burning. Please call to advise

## 2022-01-16 NOTE — Telephone Encounter (Signed)
See result note.  

## 2022-02-27 ENCOUNTER — Telehealth: Payer: Self-pay | Admitting: Gastroenterology

## 2022-02-27 NOTE — Telephone Encounter (Signed)
Spoke with pt and he will come to the lab to pick up the collection kit for the H pylori stool test. He knows he can come M-F between the hours of 7:30am-5pm, no appt is needed.

## 2022-02-27 NOTE — Telephone Encounter (Signed)
Inbound call from patient stating that he needs to do a stool sample. Patient is requesting a call back to discuss. Please advise.

## 2022-03-07 ENCOUNTER — Other Ambulatory Visit: Payer: Medicaid Other

## 2022-03-11 ENCOUNTER — Other Ambulatory Visit: Payer: Medicaid Other

## 2022-03-11 DIAGNOSIS — A048 Other specified bacterial intestinal infections: Secondary | ICD-10-CM

## 2022-03-13 LAB — H. PYLORI ANTIGEN, STOOL: H pylori Ag, Stl: NEGATIVE

## 2022-03-13 NOTE — Progress Notes (Signed)
Mr. Christopher Abbott, Your H. pylori stool test was negative.  This is good news.  This indicates that the bacteria was eradicated with antibiotics.  No further treatment or evaluation is needed. Please follow-up with me as needed in the office for any ongoing GI symptoms.

## 2022-04-30 ENCOUNTER — Other Ambulatory Visit: Payer: Self-pay | Admitting: Gastroenterology

## 2022-07-29 ENCOUNTER — Ambulatory Visit
Admission: EM | Admit: 2022-07-29 | Discharge: 2022-07-29 | Disposition: A | Discharge status: Home / Self Care | Payer: Medicaid Other | Attending: Nurse Practitioner | Admitting: Nurse Practitioner

## 2022-07-29 DIAGNOSIS — T7840XA Allergy, unspecified, initial encounter: Secondary | ICD-10-CM

## 2022-07-29 DIAGNOSIS — R051 Acute cough: Secondary | ICD-10-CM

## 2022-07-29 DIAGNOSIS — J392 Other diseases of pharynx: Secondary | ICD-10-CM | POA: Diagnosis not present

## 2022-07-29 MED ORDER — BENZONATATE 200 MG PO CAPS: 200.0000 mg | ORAL_CAPSULE | Freq: Three times a day (TID) | ORAL | 0 refills | Status: DC | PRN

## 2022-07-29 MED ORDER — PROMETHAZINE-DM 6.25-15 MG/5ML PO SYRP: 5.0000 mL | ORAL_SOLUTION | Freq: Four times a day (QID) | ORAL | 0 refills | Status: DC | PRN

## 2022-07-29 NOTE — Discharge Instructions (Signed)
Start Flonase over-the-counter and or allergy medication such as Claritin or Zyrtec Promethazine DM as needed for cough at night.  Please note this medication can make you drowsy Tessalon as needed for cough during the day Follow-up with your PCP if your symptoms do not improve Please go to the emergency room for any worsening symptoms

## 2022-07-29 NOTE — ED Provider Notes (Signed)
[facility]    CSN: [id number] Arrival date & time: 07/29/22  1818      History   Chief Complaint Chief Complaint  Patient presents with  . Cough    HPI Christopher Abbott is a 47 y.o. male presents for evaluation of allergies.  Patient reports 3 days of an itchy throat with postnasal drip and cough.  States he gets allergies every year.  States his PCP know may give some codeine cough syrup, steroids, and benzonatate.  He just started his Flonase and Claritin today.  Denies any fevers, chills, body aches, ear pain, sore throat, shortness of breath.  No asthma history.  He is an occasional cigar smoker.  No other concerns at this time.   Cough   Past Medical History:  Diagnosis Date  . Diabetes mellitus    type 2  . GERD (gastroesophageal reflux disease)   . Hyperlipidemia   . Hypertension     Patient Active Problem List   Diagnosis Date Noted  . Internal hemorrhoid, bleeding 08/29/2017  . Type 2 diabetes mellitus ([place]) 08/06/2017  . 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 Rupture of left Achilles tendon 07/18/2015    Past Surgical History:  Procedure Laterality Date  . ACHILLES TENDON SURGERY Left 07/18/2015   Procedure: LEFT ACHILLES TENDON DIRECT PRIMARY REPAIR;  Surgeon: Mcarthur Rossetti, MD;  Location: [facility];  Service: Orthopedics;  Laterality: Left;  . COLONOSCOPY WITH PROPOFOL N/A 08/14/2017   Procedure: COLONOSCOPY WITH PROPOFOL;  Surgeon: Lin Landsman, MD;  Location: [facility];  Service: Gastroenterology;  Laterality: N/A;  . NO PAST SURGERIES         Home Medications    Prior to Admission medications   Medication Sig Start Date End Date Taking? Authorizing Provider  benzonatate (TESSALON) 200 MG capsule Take 1 capsule (200 mg total) by mouth 3 (three) times daily as needed for cough. 07/29/22  Yes Melynda Ripple, NP  promethazine-dextromethorphan (PROMETHAZINE-DM) 6.25-15 MG/5ML syrup Take 5 mLs by mouth 4 (four) times daily as needed  for cough. 07/29/22  Yes Melynda Ripple, NP  ACCU-CHEK FASTCLIX LANCETS MISC USE AS DIRECTED TO CHECK BLOOD SUAGR DX: E11.9 08/28/17   [provider]  ACCU-CHEK GUIDE test strip USE AS DIRECTED TO TEST BLOOD SUGAR TWICE A DAY DX:E11.9 08/29/17   [provider]  acetaminophen (TYLENOL) 325 MG tablet Take 2 tablets (650 mg total) by mouth every 6 (six) hours as needed for moderate pain. Patient not taking: Reported on 01/08/2022 06/12/21   Jaynee Eagles, PA-C  atorvastatin (LIPITOR) 40 MG tablet Take 40 mg by mouth at bedtime. 11/09/21   [provider]  B-D ULTRAFINE III SHORT PEN 31G X 8 MM MISC USE WITH INSULIN PEN INJECTIONS DAILY ( DX: E11.9) 09/17/17   [provider]  Bismuth 262 MG CHEW Chew 524 mg by mouth 4 (four) times daily. 01/16/22   Daryel November, MD  Blood Glucose Monitoring Suppl (ACCU-CHEK GUIDE) w/Device KIT USE AS DIRECTED DX E11.9 (MEDICAID PREFERRED) 08/28/17   [provider]  clonazePAM (KLONOPIN) 1 MG tablet Take 1 mg by mouth 2 (two) times daily.    [provider]  dexlansoprazole (DEXILANT) 60 MG capsule Take 1 capsule by mouth daily. 12/07/21   [provider]  doxycycline (VIBRAMYCIN) 100 MG capsule Take 1 capsule (100 mg total) by mouth 2 (two) times daily. 01/16/22   Daryel November, MD  esomeprazole (NEXIUM) 20 MG capsule Take 20 mg by  mouth daily at 12 noon.    [provider]  HYDROcodone-acetaminophen (NORCO/VICODIN) 5-325 MG tablet Take 2 tablets by mouth every 4 (four) hours as needed. 11/21/19   Faustino Congress, NP  hydrocortisone (ANUSOL-HC) 2.5 % rectal cream Apply rectally 2 times daily Patient not taking: Reported on 06/12/2021 02/07/17   Robyn Haber, MD  insulin glargine (LANTUS) 100 UNIT/ML injection Inject 70 Units into the skin daily.     [provider]  LANTUS SOLOSTAR 100 UNIT/ML Solostar Pen INJECT 45 UNITS INTO THE SKIN 4 TIMES A DAY 08/09/17   [provider]   levocetirizine (XYZAL) 5 MG tablet Take 1 tablet (5 mg total) by mouth every evening. 06/12/21   Jaynee Eagles, PA-C  lisinopril-hydrochlorothiazide (PRINZIDE,ZESTORETIC) 20-12.5 MG tablet Take 1 tablet by mouth daily. for high blood pressure 07/02/15   [provider]  meloxicam (MOBIC) 15 MG tablet Take 1 tablet (15 mg total) by mouth daily. 04/16/21   Mcarthur Rossetti, MD  metFORMIN (GLUCOPHAGE) 1000 MG tablet Take 1,000 mg by mouth 2 (two) times daily with a meal.    [provider]  metoprolol tartrate (LOPRESSOR) 100 MG tablet Take 100 mg 2 hours before Coronary CT Patient not taking: Reported on 01/08/2022 06/19/21   [place], Peter M, MD  metroNIDAZOLE (FLAGYL) 250 MG tablet Take 1 tablet (250 mg total) by mouth 4 (four) times daily. 01/16/22   Daryel November, MD  omeprazole (PRILOSEC) 20 MG capsule Take 1 capsule (20 mg total) by mouth 2 (two) times daily before a meal. 01/16/22   Daryel November, MD  pravastatin (PRAVACHOL) 40 MG tablet Take 40 mg by mouth every evening. for cholesterol Patient not taking: Reported on 01/10/2022 05/30/15   [provider]  [place] KIT 17.5-3.13-1.6 GM/177ML SOLN See admin instructions. Patient not taking: Reported on 01/08/2022 08/12/17   [provider]  terbinafine (LAMISIL) 250 MG tablet Take 1 tablet (250 mg total) by mouth daily. 05/09/21   Wallene Huh, DPM  tiZANidine (ZANAFLEX) 4 MG tablet Take 1 tablet (4 mg total) by mouth at bedtime. 06/12/21   Jaynee Eagles, PA-C  VICTOZA 18 MG/3ML SOPN SMARTSIG:0.3 Milliliter(s) SUB-Q Daily 12/25/21   [provider]  Vitamin D, Ergocalciferol, (DRISDOL) 50000 units CAPS capsule TAKE ONE CAPSULE BY MOUTH ONCE A WEEK FOR 12 WEEKS 08/09/17   [provider]    Family History Family History  Problem Relation Age of Onset  . Diabetes Mother   . Diabetes Father   . Diabetes Sister   . Colon cancer Neg Hx   . Stomach cancer Neg Hx   . Esophageal  cancer Neg Hx   . Colon polyps Neg Hx     Social History Social History   Tobacco Use  . Smoking status: Some Days    Types: Cigars  . Smokeless tobacco: Never  Vaping Use  . Vaping Use: Never used  Substance Use Topics  . Alcohol use: Yes    Comment: none now, once per week or less  . Drug use: No     Allergies   Patient has no known allergies.   Review of Systems Review of Systems  HENT:         Itchy throat  Respiratory:  Positive for cough.      Physical Exam Triage Vital Signs ED Triage Vitals [07/29/22 1844]  Enc Vitals Group     BP 139/86     Pulse Rate 93  Resp 20     Temp 98.4 F (36.9 C)     Temp Source Oral     SpO2 94 %     Weight      Height      Head Circumference      Peak Flow      Pain Score 0     Pain Loc      Pain Edu?      Excl. in [place]?    No data found.  Updated Vital Signs BP 139/86 (BP Location: Right Arm)   Pulse 93   Temp 98.4 F (36.9 C) (Oral)   Resp 20   SpO2 94%   Visual Acuity Right Eye Distance:   Left Eye Distance:   Bilateral Distance:    Right Eye Near:   Left Eye Near:    Bilateral Near:     Physical Exam Vitals and nursing note reviewed.  Constitutional:      General: He is not in acute distress.    Appearance: Normal appearance. He is not ill-appearing or toxic-appearing.  HENT:     Head: Normocephalic and atraumatic.     Right Ear: Tympanic membrane and ear canal normal.     Left Ear: Tympanic membrane and ear canal normal.     Nose: Congestion present.     Mouth/Throat:     Mouth: Mucous membranes are moist.     Pharynx: No oropharyngeal exudate or posterior oropharyngeal erythema.  Eyes:     Pupils: Pupils are equal, round, and reactive to light.  Cardiovascular:     Rate and Rhythm: Normal rate and regular rhythm.     Heart sounds: Normal heart sounds.  Pulmonary:     Effort: Pulmonary effort is normal.     Breath sounds: Normal breath sounds.  Musculoskeletal:     Cervical back:  Normal range of motion and neck supple.  Lymphadenopathy:     Cervical: No cervical adenopathy.  Skin:    General: Skin is warm and dry.  Neurological:     General: No focal deficit present.     Mental Status: He is alert and oriented to person, place, and time.  Psychiatric:        Mood and Affect: Mood normal.        Behavior: Behavior normal.     UC Treatments / Results  Labs (all labs ordered are listed, but only abnormal results are displayed) Labs Reviewed - No data to display  EKG   Radiology No results found.  Procedures Procedures (including critical care time)  Medications Ordered in UC Medications - No data to display  Initial Impression / Assessment and Plan / UC Course  I have reviewed the triage vital signs and the nursing notes.  Pertinent labs & imaging results that were available during my care of the patient were reviewed by me and considered in my medical decision making (see chart for details).    Reviewed exam and symptoms with patient.  No red flags.  Patient states this is his typical allergy presentation. Patient to continue Flonase and Claritin or Zyrtec Tessalon as needed for cough Promethazine DM as needed for cough at night Follow-up with PCP if symptoms do not improve ER precautions reviewed and patient verbalized understanding Final Clinical Impressions(s) / UC Diagnoses   Final diagnoses:  Throat irritation  Allergy, initial encounter  Acute cough     Discharge Instructions      Start Flonase over-the-counter and or allergy medication such  as Claritin or Zyrtec Promethazine DM as needed for cough at night.  Please note this medication can make you drowsy Tessalon as needed for cough during the day Follow-up with your PCP if your symptoms do not improve Please go to the emergency room for any worsening symptoms   ED Prescriptions     Medication Sig Dispense Auth. Provider   benzonatate (TESSALON) 200 MG capsule Take 1  capsule (200 mg total) by mouth 3 (three) times daily as needed for cough. 20 capsule Melynda Ripple, NP   promethazine-dextromethorphan (PROMETHAZINE-DM) 6.25-15 MG/5ML syrup Take 5 mLs by mouth 4 (four) times daily as needed for cough. 118 mL Melynda Ripple, NP      PDMP not reviewed this encounter.   Melynda Ripple, NP 07/29/22 [phone]

## 2022-07-29 NOTE — ED Triage Notes (Signed)
Pt presents with c/o itchy throat and cough. Pt states he has not slept all weekend.

## 2022-07-31 ENCOUNTER — Ambulatory Visit (HOSPITAL_COMMUNITY)
Admission: EM | Admit: 2022-07-31 | Discharge: 2022-07-31 | Disposition: A | Discharge status: Home / Self Care | Payer: Medicaid Other | Attending: Internal Medicine | Admitting: Internal Medicine

## 2022-07-31 ENCOUNTER — Encounter (HOSPITAL_COMMUNITY): Payer: Self-pay

## 2022-07-31 DIAGNOSIS — R051 Acute cough: Secondary | ICD-10-CM

## 2022-07-31 MED ORDER — HYDROCODONE BIT-HOMATROP MBR 5-1.5 MG/5ML PO SOLN: 5.0000 mL | Freq: Four times a day (QID) | ORAL | 0 refills | Status: DC | PRN

## 2022-07-31 MED ORDER — PREDNISONE 20 MG PO TABS: 40.0000 mg | ORAL_TABLET | Freq: Every day | ORAL | 0 refills | Status: AC

## 2022-07-31 NOTE — ED Triage Notes (Signed)
Patient c/o an "itchy cough" x 5 days. Patient reports that he went to another UC and was given Promethazine DM and states he told the provider that it would not help. Patient is here for Hydrocodone cough medication.

## 2022-07-31 NOTE — ED Provider Notes (Signed)
[place]    CSN: [id number] Arrival date & time: 07/31/22  1351      History   Chief Complaint Chief Complaint  Patient presents with   Cough    HPI Christopher Abbott is a 47 y.o. male.   Patient presents to urgent care for evaluation of cough and generalized fatigue that has been persistent for the last 3 days.  States cough is persistent and much worse at nighttime causing him to have significant sleep disruption.  He was seen in urgent care yesterday where he was prescribed Promethazine DM, however states this has not helped at all with his cough at nighttime.  He has received Hycodan cough syrup in the past and states this helped significantly.  He was also prescribed Tessalon Perles, Zyrtec, and Flonase at urgent care visit yesterday and has been taking these medications for cough without much relief.  He smokes cigars daily, denies history of chronic respiratory problems.  No other drug use.  He denies chest pain, shortness of breath, heart palpitations, nausea, vomiting, dizziness, and abdominal pain.  Reports hearing wheezing to his chest at nighttime.  No orthopnea or leg swelling reported.  Has been using over-the-counter medications without relief.   Cough   Past Medical History:  Diagnosis Date   Diabetes mellitus    type 2   GERD (gastroesophageal reflux disease)    Hyperlipidemia    Hypertension     Patient Active Problem List   Diagnosis Date Noted   Internal hemorrhoid, bleeding 08/29/2017   Type 2 diabetes mellitus ([place]) 08/06/2017   Achilles rupture, left 07/18/2015   Rupture of left Achilles tendon 07/18/2015    Past Surgical History:  Procedure Laterality Date   ACHILLES TENDON SURGERY Left 07/18/2015   Procedure: LEFT ACHILLES TENDON DIRECT PRIMARY REPAIR;  Surgeon: Mcarthur Rossetti, MD;  Location: [place];  Service: Orthopedics;  Laterality: Left;   COLONOSCOPY WITH PROPOFOL N/A 08/14/2017   Procedure: COLONOSCOPY WITH PROPOFOL;   Surgeon: Lin Landsman, MD;  Location: [facility];  Service: Gastroenterology;  Laterality: N/A;   NO PAST SURGERIES         Home Medications    Prior to Admission medications   Medication Sig Start Date End Date Taking? Authorizing Provider  HYDROcodone bit-homatropine (HYCODAN) 5-1.5 MG/5ML syrup Take 5 mLs by mouth every 6 (six) hours as needed for cough. 07/31/22  Yes Talbot Grumbling, FNP  predniSONE (DELTASONE) 20 MG tablet Take 2 tablets (40 mg total) by mouth daily for 5 days. 07/31/22 08/05/22 Yes Katricia Prehn, Stasia Cavalier, FNP  ACCU-CHEK FASTCLIX LANCETS MISC USE AS DIRECTED TO CHECK BLOOD SUAGR DX: E11.9 08/28/17   [provider]  ACCU-CHEK GUIDE test strip USE AS DIRECTED TO TEST BLOOD SUGAR TWICE A DAY DX:E11.9 08/29/17   [provider]  acetaminophen (TYLENOL) 325 MG tablet Take 2 tablets (650 mg total) by mouth every 6 (six) hours as needed for moderate pain. Patient not taking: Reported on 01/08/2022 06/12/21   Jaynee Eagles, PA-C  atorvastatin (LIPITOR) 40 MG tablet Take 40 mg by mouth at bedtime. 11/09/21   [provider]  B-D ULTRAFINE III SHORT PEN 31G X 8 MM MISC USE WITH INSULIN PEN INJECTIONS DAILY ( DX: E11.9) 09/17/17   [provider]  benzonatate (TESSALON) 200 MG capsule Take 1 capsule (200 mg total) by mouth 3 (three) times daily as needed for cough. 07/29/22   Melynda Ripple, NP  Bismuth 262 MG CHEW Chew 524 mg  by mouth 4 (four) times daily. 01/16/22   Daryel November, MD  Blood Glucose Monitoring Suppl (ACCU-CHEK GUIDE) w/Device KIT USE AS DIRECTED DX E11.9 (MEDICAID PREFERRED) 08/28/17   [provider]  clonazePAM (KLONOPIN) 1 MG tablet Take 1 mg by mouth 2 (two) times daily.    [provider]  dexlansoprazole (DEXILANT) 60 MG capsule Take 1 capsule by mouth daily. 12/07/21   [provider]  doxycycline (VIBRAMYCIN) 100 MG capsule Take 1 capsule (100 mg total) by mouth 2 (two) times daily. 01/16/22    Daryel November, MD  esomeprazole (NEXIUM) 20 MG capsule Take 20 mg by mouth daily at 12 noon.    [provider]  HYDROcodone-acetaminophen (NORCO/VICODIN) 5-325 MG tablet Take 2 tablets by mouth every 4 (four) hours as needed. 11/21/19   Faustino Congress, NP  hydrocortisone (ANUSOL-HC) 2.5 % rectal cream Apply rectally 2 times daily Patient not taking: Reported on 06/12/2021 02/07/17   Robyn Haber, MD  insulin glargine (LANTUS) 100 UNIT/ML injection Inject 70 Units into the skin daily.     [provider]  LANTUS SOLOSTAR 100 UNIT/ML Solostar Pen INJECT 45 UNITS INTO THE SKIN 4 TIMES A DAY 08/09/17   [provider]  levocetirizine (XYZAL) 5 MG tablet Take 1 tablet (5 mg total) by mouth every evening. 06/12/21   Jaynee Eagles, PA-C  lisinopril-hydrochlorothiazide (PRINZIDE,ZESTORETIC) 20-12.5 MG tablet Take 1 tablet by mouth daily. for high blood pressure 07/02/15   [provider]  meloxicam (MOBIC) 15 MG tablet Take 1 tablet (15 mg total) by mouth daily. 04/16/21   Mcarthur Rossetti, MD  metFORMIN (GLUCOPHAGE) 1000 MG tablet Take 1,000 mg by mouth 2 (two) times daily with a meal.    [provider]  metoprolol tartrate (LOPRESSOR) 100 MG tablet Take 100 mg 2 hours before Coronary CT Patient not taking: Reported on 01/08/2022 06/19/21   [place], Peter M, MD  metroNIDAZOLE (FLAGYL) 250 MG tablet Take 1 tablet (250 mg total) by mouth 4 (four) times daily. 01/16/22   Daryel November, MD  omeprazole (PRILOSEC) 20 MG capsule Take 1 capsule (20 mg total) by mouth 2 (two) times daily before a meal. 01/16/22   Daryel November, MD  pravastatin (PRAVACHOL) 40 MG tablet Take 40 mg by mouth every evening. for cholesterol Patient not taking: Reported on 01/10/2022 05/30/15   [provider]  [place] KIT 17.5-3.13-1.6 GM/177ML SOLN See admin instructions. Patient not taking: Reported on 01/08/2022 08/12/17   [provider]   terbinafine (LAMISIL) 250 MG tablet Take 1 tablet (250 mg total) by mouth daily. 05/09/21   Wallene Huh, DPM  tiZANidine (ZANAFLEX) 4 MG tablet Take 1 tablet (4 mg total) by mouth at bedtime. 06/12/21   Jaynee Eagles, PA-C  VICTOZA 18 MG/3ML SOPN SMARTSIG:0.3 Milliliter(s) SUB-Q Daily 12/25/21   [provider]  Vitamin D, Ergocalciferol, (DRISDOL) 50000 units CAPS capsule TAKE ONE CAPSULE BY MOUTH ONCE A WEEK FOR 12 WEEKS 08/09/17   [provider]    Family History Family History  Problem Relation Age of Onset   Diabetes Mother    Diabetes Father    Diabetes Sister    Colon cancer Neg Hx    Stomach cancer Neg Hx    Esophageal cancer Neg Hx    Colon polyps Neg Hx     Social History Social History   Tobacco Use   Smoking status: Some Days    Types: Cigars   Smokeless  tobacco: Never  Vaping Use   Vaping Use: Never used  Substance Use Topics   Alcohol use: Yes    Comment: none now, once per week or less   Drug use: No     Allergies   Patient has no known allergies.   Review of Systems Review of Systems  Respiratory:  Positive for cough.   Per HPI   Physical Exam Triage Vital Signs ED Triage Vitals  Enc Vitals Group     BP 07/31/22 1502 127/83     Pulse Rate 07/31/22 1502 97     Resp 07/31/22 1502 16     Temp 07/31/22 1502 98.9 F (37.2 C)     Temp Source 07/31/22 1502 Oral     SpO2 07/31/22 1502 95 %     Weight --      Height --      Head Circumference --      Peak Flow --      Pain Score 07/31/22 1503 0     Pain Loc --      Pain Edu? --      Excl. in [place]? --    No data found.  Updated Vital Signs BP 127/83 (BP Location: Left Arm)   Pulse 97   Temp 98.9 F (37.2 C) (Oral)   Resp 16   SpO2 95%   Visual Acuity Right Eye Distance:   Left Eye Distance:   Bilateral Distance:    Right Eye Near:   Left Eye Near:    Bilateral Near:     Physical Exam Vitals and nursing note reviewed.  Constitutional:      Appearance: He is  obese. He is ill-appearing. He is not toxic-appearing.     Comments: Appears fatigued.  HENT:     Head: Normocephalic and atraumatic.     Right Ear: Hearing, tympanic membrane, ear canal and external ear normal.     Left Ear: Hearing, tympanic membrane, ear canal and external ear normal.     Nose: Nose normal.     Mouth/Throat:     Lips: Pink.     Mouth: Mucous membranes are moist. No injury.     Tongue: No lesions. Tongue does not deviate from midline.     Palate: No mass and lesions.     Pharynx: Oropharynx is clear. Uvula midline. No pharyngeal swelling, oropharyngeal exudate, posterior oropharyngeal erythema or uvula swelling.     Tonsils: No tonsillar exudate or tonsillar abscesses.  Eyes:     General: Lids are normal. Vision grossly intact. Gaze aligned appropriately.     Extraocular Movements: Extraocular movements intact.     Conjunctiva/sclera: Conjunctivae normal.  Cardiovascular:     Rate and Rhythm: Normal rate and regular rhythm.     Heart sounds: Normal heart sounds, S1 normal and S2 normal.  Pulmonary:     Effort: Pulmonary effort is normal. No respiratory distress.     Breath sounds: Normal air entry. Wheezing present.     Comments: Faint coarse breath sounds heard to the lower lung fields without respiratory distress, wheeze, rhonchi, rales, or tachypnea.  Speaking in full sentences without difficulty. Musculoskeletal:     Cervical back: Neck supple.  Skin:    General: Skin is warm and dry.     Capillary Refill: Capillary refill takes less than 2 seconds.     Findings: No rash.  Neurological:     General: No focal deficit present.     Mental Status: He is alert  and oriented to person, place, and time. Mental status is at baseline.     Cranial Nerves: No dysarthria or facial asymmetry.  Psychiatric:        Mood and Affect: Mood normal.        Speech: Speech normal.        Behavior: Behavior normal.        Thought Content: Thought content normal.         Judgment: Judgment normal.      UC Treatments / Results  Labs (all labs ordered are listed, but only abnormal results are displayed) Labs Reviewed - No data to display  EKG   Radiology No results found.  Procedures Procedures (including critical care time)  Medications Ordered in UC Medications - No data to display  Initial Impression / Assessment and Plan / UC Course  I have reviewed the triage vital signs and the nursing notes.  Pertinent labs & imaging results that were available during my care of the patient were reviewed by me and considered in my medical decision making (see chart for details).   1.  Acute cough Hycodan cough syrup sent to pharmacy after PDMP was reviewed.  Patient has received this cough medicine in years past and has not received it since January 2023.  He is to use cough medicine as directed.  Drowsiness precautions discussed, he voices understanding.  Prednisone 40 mg once daily for the next 5 days sent to pharmacy, he is a diabetic and has been advised that this will increase his blood sugars temporarily.  He states his blood sugars are very well-controlled at this time.  No NSAID while taking prednisone.  He is to continue all other medications prescribed at urgent care visit yesterday (Zyrtec, Flonase, and Tessalon Perles).  He is agreeable with this plan.  Discussed physical exam and available lab work findings in clinic with patient.  Counseled patient regarding appropriate use of medications and potential side effects for all medications recommended or prescribed today. Discussed red flag signs and symptoms of worsening condition,when to call the PCP office, return to urgent care, and when to seek higher level of care in the emergency department. Patient verbalizes understanding and agreement with plan. All questions answered. Patient discharged in stable condition.    Final Clinical Impressions(s) / UC Diagnoses   Final diagnoses:  Acute cough      Discharge Instructions      Use hycodan cough syrup as needed. Do not use alcohol, drive, or go to work when taking this medication.  Take prednisone 40 mg once a day for the next 5 days to reduce inflammation and cough.  Continue taking other medications as prescribed.  If you develop any new or worsening symptoms or do not improve in the next 2 to 3 days, please return.  If your symptoms are severe, please go to the emergency room.  Follow-up with your primary care provider for further evaluation and management of your symptoms as well as ongoing wellness visits.  I hope you feel better!    ED Prescriptions     Medication Sig Dispense Auth. Provider   predniSONE (DELTASONE) 20 MG tablet Take 2 tablets (40 mg total) by mouth daily for 5 days. 10 tablet Talbot Grumbling, FNP   HYDROcodone bit-homatropine (HYCODAN) 5-1.5 MG/5ML syrup Take 5 mLs by mouth every 6 (six) hours as needed for cough. 120 mL Talbot Grumbling, FNP      I have reviewed the PDMP during this encounter.  Talbot Grumbling, [place] 07/31/22 [phone]

## 2022-07-31 NOTE — Discharge Instructions (Addendum)
Use hycodan cough syrup as needed. Do not use alcohol, drive, or go to work when taking this medication.  Take prednisone 40 mg once a day for the next 5 days to reduce inflammation and cough.  Continue taking other medications as prescribed.  If you develop any new or worsening symptoms or do not improve in the next 2 to 3 days, please return.  If your symptoms are severe, please go to the emergency room.  Follow-up with your primary care provider for further evaluation and management of your symptoms as well as ongoing wellness visits.  I hope you feel better!

## 2023-03-08 ENCOUNTER — Ambulatory Visit
Admission: EM | Admit: 2023-03-08 | Discharge: 2023-03-08 | Disposition: A | Discharge status: Home / Self Care | Payer: Medicaid Other | Attending: Internal Medicine | Admitting: Internal Medicine

## 2023-03-08 DIAGNOSIS — N4889 Other specified disorders of penis: Secondary | ICD-10-CM | POA: Diagnosis not present

## 2023-03-08 DIAGNOSIS — Z794 Long term (current) use of insulin: Secondary | ICD-10-CM | POA: Diagnosis not present

## 2023-03-08 DIAGNOSIS — E119 Type 2 diabetes mellitus without complications: Secondary | ICD-10-CM | POA: Diagnosis present

## 2023-03-08 DIAGNOSIS — R42 Dizziness and giddiness: Secondary | ICD-10-CM | POA: Insufficient documentation

## 2023-03-08 LAB — POCT FASTING CBG KUC MANUAL ENTRY: POCT Glucose (KUC): 212 mg/dL — AB (ref 70–99)

## 2023-03-08 NOTE — ED Provider Notes (Signed)
[facility]  Note:  This document was prepared using Conservation officer, historic buildings and may include unintentional dictation errors.  MRN: [id number] DOB: 1975/10/05  Subjective:   Christopher Abbott is a 47 y.o. male presenting for blood work to check on his diabetes.  Has not followed up with his PCP in some time.  He is navigating his blood sugars on his own.  Takes Lantus daily depending on his blood sugars.  He has gotten readings of 70s to low 90s.  Takes only 30 units as opposed to 70 now.  Used to take 30 units twice daily but is only doing this once daily.  He is still taking Victoza and metformin.  Has made significant diet changes, has lost weight proactively.  He does feel intermittent penile irritation, testicular pain, dizziness and urinary frequency.  No chest pain, shortness of breath, nausea, vomiting, abdominal pain, penile discharge.  He is not opposed to STI testing.  No current facility-administered medications for this encounter.  Current Outpatient Medications:    ACCU-CHEK FASTCLIX LANCETS MISC, USE AS DIRECTED TO CHECK BLOOD SUAGR DX: E11.9, Disp: , Rfl: 11   ACCU-CHEK GUIDE test strip, USE AS DIRECTED TO TEST BLOOD SUGAR TWICE A DAY DX:E11.9, Disp: , Rfl: 11   acetaminophen (TYLENOL) 325 MG tablet, Take 2 tablets (650 mg total) by mouth every 6 (six) hours as needed for moderate pain. (Patient not taking: Reported on 01/08/2022), Disp: 30 tablet, Rfl: 0   atorvastatin (LIPITOR) 40 MG tablet, Take 40 mg by mouth at bedtime., Disp: , Rfl:    B-D ULTRAFINE III SHORT PEN 31G X 8 MM MISC, USE WITH INSULIN PEN INJECTIONS DAILY ( DX: E11.9), Disp: , Rfl: 11   benzonatate (TESSALON) 200 MG capsule, Take 1 capsule (200 mg total) by mouth 3 (three) times daily as needed for cough., Disp: 20 capsule, Rfl: 0   Bismuth 262 MG CHEW, Chew 524 mg by mouth 4 (four) times daily., Disp: 112 tablet, Rfl: 0   Blood Glucose Monitoring Suppl (ACCU-CHEK GUIDE) w/Device  KIT, USE AS DIRECTED DX E11.9 (MEDICAID PREFERRED), Disp: , Rfl: 0   clonazePAM (KLONOPIN) 1 MG tablet, Take 1 mg by mouth 2 (two) times daily., Disp: , Rfl:    dexlansoprazole (DEXILANT) 60 MG capsule, Take 1 capsule by mouth daily., Disp: , Rfl:    doxycycline (VIBRAMYCIN) 100 MG capsule, Take 1 capsule (100 mg total) by mouth 2 (two) times daily., Disp: 28 capsule, Rfl: 0   esomeprazole (NEXIUM) 20 MG capsule, Take 20 mg by mouth daily at 12 noon., Disp: , Rfl:    HYDROcodone bit-homatropine (HYCODAN) 5-1.5 MG/5ML syrup, Take 5 mLs by mouth every 6 (six) hours as needed for cough., Disp: 120 mL, Rfl: 0   HYDROcodone-acetaminophen (NORCO/VICODIN) 5-325 MG tablet, Take 2 tablets by mouth every 4 (four) hours as needed., Disp: 10 tablet, Rfl: 0   hydrocortisone (ANUSOL-HC) 2.5 % rectal cream, Apply rectally 2 times daily (Patient not taking: Reported on 06/12/2021), Disp: 28 g, Rfl: 2   insulin glargine (LANTUS) 100 UNIT/ML injection, Inject 70 Units into the skin daily. , Disp: , Rfl:    LANTUS SOLOSTAR 100 UNIT/ML Solostar Pen, INJECT 45 UNITS INTO THE SKIN 4 TIMES A DAY, Disp: , Rfl: 3   levocetirizine (XYZAL) 5 MG tablet, Take 1 tablet (5 mg total) by mouth every evening., Disp: 90 tablet, Rfl: 0   lisinopril-hydrochlorothiazide (PRINZIDE,ZESTORETIC) 20-12.5 MG tablet, Take 1 tablet by mouth daily. for high  blood pressure, Disp: , Rfl: 5   meloxicam (MOBIC) 15 MG tablet, Take 1 tablet (15 mg total) by mouth daily., Disp: 30 tablet, Rfl: 3   metFORMIN (GLUCOPHAGE) 1000 MG tablet, Take 1,000 mg by mouth 2 (two) times daily with a meal., Disp: , Rfl:    metoprolol tartrate (LOPRESSOR) 100 MG tablet, Take 100 mg 2 hours before Coronary CT (Patient not taking: Reported on 01/08/2022), Disp: 1 tablet, Rfl: 0   metroNIDAZOLE (FLAGYL) 250 MG tablet, Take 1 tablet (250 mg total) by mouth 4 (four) times daily., Disp: 56 tablet, Rfl: 0   omeprazole (PRILOSEC) 20 MG capsule, Take 1 capsule (20 mg total) by  mouth 2 (two) times daily before a meal., Disp: 28 capsule, Rfl: 0   pravastatin (PRAVACHOL) 40 MG tablet, Take 40 mg by mouth every evening. for cholesterol (Patient not taking: Reported on 01/10/2022), Disp: , Rfl: 1   SUPREP BOWEL PREP KIT 17.5-3.13-1.6 GM/177ML SOLN, See admin instructions. (Patient not taking: Reported on 01/08/2022), Disp: , Rfl: 0   terbinafine (LAMISIL) 250 MG tablet, Take 1 tablet (250 mg total) by mouth daily., Disp: 45 tablet, Rfl: 0   tiZANidine (ZANAFLEX) 4 MG tablet, Take 1 tablet (4 mg total) by mouth at bedtime., Disp: 30 tablet, Rfl: 0   VICTOZA 18 MG/3ML SOPN, SMARTSIG:0.3 Milliliter(s) SUB-Q Daily, Disp: , Rfl:    Vitamin D, Ergocalciferol, (DRISDOL) 50000 units CAPS capsule, TAKE ONE CAPSULE BY MOUTH ONCE A WEEK FOR 12 WEEKS, Disp: , Rfl: 3   No Known Allergies  Past Medical History:  Diagnosis Date   Diabetes mellitus    type 2   GERD (gastroesophageal reflux disease)    Hyperlipidemia    Hypertension      Past Surgical History:  Procedure Laterality Date   ACHILLES TENDON SURGERY Left 07/18/2015   Procedure: LEFT ACHILLES TENDON DIRECT PRIMARY REPAIR;  Surgeon: Kathryne Hitch, MD;  Location: [facility];  Service: Orthopedics;  Laterality: Left;   COLONOSCOPY WITH PROPOFOL N/A 08/14/2017   Procedure: COLONOSCOPY WITH PROPOFOL;  Surgeon: Toney Reil, MD;  Location: [facility] ENDOSCOPY;  Service: Gastroenterology;  Laterality: N/A;   NO PAST SURGERIES      Family History  Problem Relation Age of Onset   Diabetes Mother    Diabetes Father    Diabetes Sister    Colon cancer Neg Hx    Stomach cancer Neg Hx    Esophageal cancer Neg Hx    Colon polyps Neg Hx     Social History   Tobacco Use   Smoking status: Some Days    Types: Cigars   Smokeless tobacco: Never  Vaping Use   Vaping status: Never Used  Substance Use Topics   Alcohol use: Yes    Comment: socially   Drug use: No    ROS   Objective:   Vitals: BP (!) 155/94 (BP  Location: Right Arm) Comment: States he did not take his blood pressure meds on purpose this morning.  Pulse 77   Temp 98.2 F (36.8 C) (Oral)   Resp 18   SpO2 95%   Physical Exam Constitutional:      General: He is not in acute distress.    Appearance: Normal appearance. He is well-developed and normal weight. He is not ill-appearing, toxic-appearing or diaphoretic.  HENT:     Head: Normocephalic and atraumatic.     Right Ear: External ear normal.     Left Ear: External ear normal.  Nose: Nose normal.     Mouth/Throat:     Mouth: Mucous membranes are moist.     Pharynx: Oropharynx is clear.  Eyes:     General: No scleral icterus.       Right eye: No discharge.        Left eye: No discharge.     Extraocular Movements: Extraocular movements intact.  Cardiovascular:     Rate and Rhythm: Normal rate and regular rhythm.     Heart sounds: Normal heart sounds. No murmur heard.    No friction rub. No gallop.  Pulmonary:     Effort: Pulmonary effort is normal. No respiratory distress.     Breath sounds: Normal breath sounds. No stridor. No wheezing, rhonchi or rales.  Genitourinary:    Penis: Circumcised. No phimosis, paraphimosis, hypospadias, erythema, tenderness, discharge, swelling or lesions.   Musculoskeletal:     Cervical back: Normal range of motion.  Neurological:     Mental Status: He is alert and oriented to person, place, and time.  Psychiatric:        Mood and Affect: Mood normal.        Behavior: Behavior normal.        Thought Content: Thought content normal.        Judgment: Judgment normal.     Results for orders placed or performed during the hospital encounter of 03/08/23 (from the past 24 hour(s))  POCT CBG (manual entry)     Status: Abnormal   Collection Time: 03/08/23  9:46 AM  Result Value Ref Range   POCT Glucose ([facility]) 212 (A) 70 - 99 mg/dL   Assessment and Plan :   PDMP not reviewed this encounter.  1. Type 2 diabetes mellitus treated with  insulin (HCC)   2. Dizziness   3. Penile irritation    Will defer medication prescriptions for now as overall his exam is reassuring and he is asymptomatic in clinic.  Will have him maintain his diabetes medications for now including using his Lantus at 30 units once daily.  Continue to monitor blood sugars.  Continue healthy lifestyle.  Follow-up with PCP as soon as possible.  I will follow with his blood test results when I return next week.  Counseled patient on potential for adverse effects with medications prescribed/recommended today, ER and return-to-clinic precautions discussed, patient verbalized understanding.    Wallis Bamberg, PA-C 03/08/23 1135

## 2023-03-08 NOTE — ED Triage Notes (Signed)
Pt reports he is diabetic, his blood sugar is going "running low" after he lost weight, yesterday fasting was 96. Pt reports for the past weeks lightheaded, "sometimes", groin pain. Pt denies any pain or lightheadedness today.

## 2023-03-08 NOTE — Discharge Instructions (Signed)
I will update you with your blood test results on Wednesday or Thursday.   For diabetes or elevated blood sugar, please make sure you are limiting and avoiding starchy, carbohydrate foods like pasta, breads, sweet breads, pastry, rice, potatoes, desserts. These foods can elevate your blood sugar. Also, limit and avoid drinks that contain a lot of sugar such as sodas, sweet teas, fruit juices.  Drinking plain water will be much more helpful, try 64 ounces of water daily.  It is okay to flavor your water naturally by cutting cucumber, lemon, mint or lime, placing it in a picture with water and drinking it over a period of 24-48 hours as long as it remains refrigerated.  For elevated blood pressure, make sure you are monitoring salt in your diet.  Do not eat restaurant foods and limit processed foods at home. I highly recommend you prepare and cook your own foods at home.  Processed foods include things like frozen meals, pre-seasoned meats and dinners, deli meats, canned foods as these foods contain a high amount of sodium/salt.  Make sure you are paying attention to sodium labels on foods you buy at the grocery store. Buy your spices separately such as garlic powder, onion powder, cumin, cayenne, parsley flakes so that you can avoid seasonings that contain salt. However, salt-free seasonings are available and can be used, an example is Mrs. Dash and includes a lot of different mixtures that do not contain salt.  Lastly, when cooking using oils that are healthier for you is important. This includes olive oil, avocado oil, canola oil. We have discussed a lot of foods to avoid but below is a list of foods that can be very healthy to use in your diet whether it is for diabetes, cholesterol, high blood pressure, or in general healthy eating.  Salads - kale, spinach, cabbage, spring mix, arugula Fruits - avocadoes, berries (blueberries, raspberries, blackberries), apples, oranges, pomegranate, grapefruit,  kiwi Vegetables - asparagus, cauliflower, broccoli, green beans, brussel sprouts, bell peppers, beets; stay away from or limit starchy vegetables like potatoes, carrots, peas Other general foods - kidney beans, egg whites, almonds, walnuts, sunflower seeds, pumpkin seeds, fat free yogurt, almond milk, flax seeds, quinoa, oats  Meat - It is better to eat lean meats and limit your red meat including pork to once a week.  Wild caught fish, chicken breast are good options as they tend to be leaner sources of good protein. Still be mindful of the sodium labels for the meats you buy.  DO NOT EAT ANY FOODS ON THIS LIST THAT YOU ARE ALLERGIC TO. For more specific needs, I highly recommend consulting a dietician or nutritionist but this can definitely be a good starting point.

## 2023-03-09 LAB — CBC
Hematocrit: 45.4 % (ref 37.5–51.0)
Hemoglobin: 15.5 g/dL (ref 13.0–17.7)
MCH: 32.7 pg (ref 26.6–33.0)
MCHC: 34.1 g/dL (ref 31.5–35.7)
MCV: 96 fL (ref 79–97)
Platelets: 261 10*3/uL (ref 150–450)
RBC: 4.74 x10E6/uL (ref 4.14–5.80)
RDW: 15.1 % (ref 11.6–15.4)
WBC: 5 10*3/uL (ref 3.4–10.8)

## 2023-03-09 LAB — COMPREHENSIVE METABOLIC PANEL
ALT: 24 [IU]/L (ref 0–44)
AST: 23 [IU]/L (ref 0–40)
Albumin: 4.4 g/dL (ref 4.1–5.1)
Alkaline Phosphatase: 75 [IU]/L (ref 44–121)
BUN/Creatinine Ratio: 11 (ref 9–20)
BUN: 11 mg/dL (ref 6–24)
Bilirubin Total: 0.3 mg/dL (ref 0.0–1.2)
CO2: 24 mmol/L (ref 20–29)
Calcium: 9.7 mg/dL (ref 8.7–10.2)
Chloride: 99 mmol/L (ref 96–106)
Creatinine, Ser: 1.04 mg/dL (ref 0.76–1.27)
Globulin, Total: 2.4 g/dL (ref 1.5–4.5)
Glucose: 196 mg/dL — ABNORMAL HIGH (ref 70–99)
Potassium: 3.6 mmol/L (ref 3.5–5.2)
Sodium: 140 mmol/L (ref 134–144)
Total Protein: 6.8 g/dL (ref 6.0–8.5)
eGFR: 91 mL/min/{1.73_m2} (ref >59)

## 2023-03-10 LAB — HEMOGLOBIN A1C
Est. average glucose Bld gHb Est-mCnc: 183 mg/dL
Hgb A1c MFr Bld: 8 % — ABNORMAL HIGH (ref 4.8–5.6)

## 2023-03-10 LAB — CYTOLOGY, (ORAL, ANAL, URETHRAL) ANCILLARY ONLY
Chlamydia: NEGATIVE
Comment: NEGATIVE
Comment: NEGATIVE
Comment: NORMAL
Neisseria Gonorrhea: NEGATIVE
Trichomonas: NEGATIVE

## 2023-05-12 ENCOUNTER — Encounter (HOSPITAL_COMMUNITY): Payer: Self-pay

## 2023-05-12 ENCOUNTER — Ambulatory Visit
Admission: EM | Admit: 2023-05-12 | Discharge: 2023-05-12 | Disposition: A | Discharge status: Home / Self Care | Payer: Medicaid Other | Attending: Family Medicine | Admitting: Family Medicine

## 2023-05-12 ENCOUNTER — Other Ambulatory Visit: Payer: Self-pay

## 2023-05-12 ENCOUNTER — Emergency Department (HOSPITAL_COMMUNITY)
Admission: EM | Admit: 2023-05-12 | Discharge: 2023-05-12 | Disposition: A | Discharge status: Home / Self Care | Payer: Medicaid Other | Attending: Emergency Medicine | Admitting: Emergency Medicine

## 2023-05-12 ENCOUNTER — Emergency Department (HOSPITAL_COMMUNITY): Payer: Medicaid Other

## 2023-05-12 DIAGNOSIS — I10 Essential (primary) hypertension: Secondary | ICD-10-CM | POA: Insufficient documentation

## 2023-05-12 DIAGNOSIS — R079 Chest pain, unspecified: Secondary | ICD-10-CM

## 2023-05-12 DIAGNOSIS — R0789 Other chest pain: Secondary | ICD-10-CM | POA: Diagnosis not present

## 2023-05-12 DIAGNOSIS — Z79899 Other long term (current) drug therapy: Secondary | ICD-10-CM | POA: Insufficient documentation

## 2023-05-12 DIAGNOSIS — E119 Type 2 diabetes mellitus without complications: Secondary | ICD-10-CM | POA: Diagnosis not present

## 2023-05-12 DIAGNOSIS — R9431 Abnormal electrocardiogram [ECG] [EKG]: Secondary | ICD-10-CM

## 2023-05-12 DIAGNOSIS — Z794 Long term (current) use of insulin: Secondary | ICD-10-CM | POA: Diagnosis not present

## 2023-05-12 LAB — BASIC METABOLIC PANEL
Anion gap: 11 (ref 5–15)
BUN: 8 mg/dL (ref 6–20)
CO2: 24 mmol/L (ref 22–32)
Calcium: 9.2 mg/dL (ref 8.9–10.3)
Chloride: 100 mmol/L (ref 98–111)
Creatinine, Ser: 1.05 mg/dL (ref 0.61–1.24)
GFR, Estimated: >60 mL/min (ref >60)
Glucose, Bld: 262 mg/dL — ABNORMAL HIGH (ref 70–99)
Potassium: 3.8 mmol/L (ref 3.5–5.1)
Sodium: 135 mmol/L (ref 135–145)

## 2023-05-12 LAB — CBC
HCT: 43.9 % (ref 39.0–52.0)
Hemoglobin: 14.9 g/dL (ref 13.0–17.0)
MCH: 32.1 pg (ref 26.0–34.0)
MCHC: 33.9 g/dL (ref 30.0–36.0)
MCV: 94.6 fL (ref 80.0–100.0)
Platelets: 241 10*3/uL (ref 150–400)
RBC: 4.64 MIL/uL (ref 4.22–5.81)
RDW: 13.7 % (ref 11.5–15.5)
WBC: 4.4 10*3/uL (ref 4.0–10.5)
nRBC: 0 % (ref 0.0–0.2)

## 2023-05-12 LAB — D-DIMER, QUANTITATIVE: D-Dimer, Quant: <0.27 ug{FEU}/mL (ref 0.00–0.50)

## 2023-05-12 LAB — TROPONIN I (HIGH SENSITIVITY)
Troponin I (High Sensitivity): 5 ng/L (ref <18)
Troponin I (High Sensitivity): 5 ng/L (ref <18)

## 2023-05-12 NOTE — Discharge Instructions (Addendum)
I am concerned about your heart given your chest discomfort for the past week and also the abnormal EKG we have in clinic.  Please head straight to the emergency room now for a higher level of testing and care than we can provide in the urgent care setting.

## 2023-05-12 NOTE — ED Provider Notes (Signed)
[facility] - URGENT CARE CENTER  Note:  This document was prepared using Conservation officer, historic buildings and may include unintentional dictation errors.  MRN: [id number] DOB: 11-09-1975  Subjective:   Christopher Abbott is a 47 y.o. male presenting for 1 week history of intermittent mid to left-sided chest pain, chest tightness.  Does not feel any discomfort here in the clinic.  Cardiac risk factors include type 2 diabetes treated with insulin, hyperlipidemia, hypertension, smoking.  No diaphoresis, nausea, vomiting, abdominal pain, left sided jaw/neck/arm pain.  No shortness of breath, heart racing, palpitations.  No history of heart disease or MI.  No current facility-administered medications for this encounter.  Current Outpatient Medications:    fenofibrate (TRICOR) 145 MG tablet, Take 145 mg by mouth daily., Disp: , Rfl:    ACCU-CHEK FASTCLIX LANCETS MISC, USE AS DIRECTED TO CHECK BLOOD SUAGR DX: E11.9, Disp: , Rfl: 11   ACCU-CHEK GUIDE test strip, USE AS DIRECTED TO TEST BLOOD SUGAR TWICE A DAY DX:E11.9, Disp: , Rfl: 11   acetaminophen (TYLENOL) 325 MG tablet, Take 2 tablets (650 mg total) by mouth every 6 (six) hours as needed for moderate pain. (Patient not taking: Reported on 01/08/2022), Disp: 30 tablet, Rfl: 0   atorvastatin (LIPITOR) 40 MG tablet, Take 40 mg by mouth at bedtime., Disp: , Rfl:    B-D ULTRAFINE III SHORT PEN 31G X 8 MM MISC, USE WITH INSULIN PEN INJECTIONS DAILY ( DX: E11.9), Disp: , Rfl: 11   benzonatate (TESSALON) 200 MG capsule, Take 1 capsule (200 mg total) by mouth 3 (three) times daily as needed for cough., Disp: 20 capsule, Rfl: 0   Bismuth 262 MG CHEW, Chew 524 mg by mouth 4 (four) times daily., Disp: 112 tablet, Rfl: 0   Blood Glucose Monitoring Suppl (ACCU-CHEK GUIDE) w/Device KIT, USE AS DIRECTED DX E11.9 (MEDICAID PREFERRED), Disp: , Rfl: 0   clonazePAM (KLONOPIN) 1 MG tablet, Take 1 mg by mouth 2 (two) times daily., Disp: , Rfl:    dexlansoprazole  (DEXILANT) 60 MG capsule, Take 1 capsule by mouth daily., Disp: , Rfl:    doxycycline (VIBRAMYCIN) 100 MG capsule, Take 1 capsule (100 mg total) by mouth 2 (two) times daily., Disp: 28 capsule, Rfl: 0   esomeprazole (NEXIUM) 20 MG capsule, Take 20 mg by mouth daily at 12 noon., Disp: , Rfl:    HYDROcodone bit-homatropine (HYCODAN) 5-1.5 MG/5ML syrup, Take 5 mLs by mouth every 6 (six) hours as needed for cough., Disp: 120 mL, Rfl: 0   HYDROcodone-acetaminophen (NORCO/VICODIN) 5-325 MG tablet, Take 2 tablets by mouth every 4 (four) hours as needed., Disp: 10 tablet, Rfl: 0   hydrocortisone (ANUSOL-HC) 2.5 % rectal cream, Apply rectally 2 times daily (Patient not taking: Reported on 06/12/2021), Disp: 28 g, Rfl: 2   insulin glargine (LANTUS) 100 UNIT/ML injection, Inject 70 Units into the skin daily. , Disp: , Rfl:    LANTUS SOLOSTAR 100 UNIT/ML Solostar Pen, INJECT 45 UNITS INTO THE SKIN 4 TIMES A DAY, Disp: , Rfl: 3   levocetirizine (XYZAL) 5 MG tablet, Take 1 tablet (5 mg total) by mouth every evening., Disp: 90 tablet, Rfl: 0   lisinopril-hydrochlorothiazide (PRINZIDE,ZESTORETIC) 20-12.5 MG tablet, Take 1 tablet by mouth daily. for high blood pressure, Disp: , Rfl: 5   meloxicam (MOBIC) 15 MG tablet, Take 1 tablet (15 mg total) by mouth daily., Disp: 30 tablet, Rfl: 3   metFORMIN (GLUCOPHAGE) 1000 MG tablet, Take 1,000 mg by mouth 2 (two) times daily with  a meal., Disp: , Rfl:    metoprolol tartrate (LOPRESSOR) 100 MG tablet, Take 100 mg 2 hours before Coronary CT (Patient not taking: Reported on 01/08/2022), Disp: 1 tablet, Rfl: 0   metroNIDAZOLE (FLAGYL) 250 MG tablet, Take 1 tablet (250 mg total) by mouth 4 (four) times daily., Disp: 56 tablet, Rfl: 0   omeprazole (PRILOSEC) 20 MG capsule, Take 1 capsule (20 mg total) by mouth 2 (two) times daily before a meal., Disp: 28 capsule, Rfl: 0   pravastatin (PRAVACHOL) 40 MG tablet, Take 40 mg by mouth every evening. for cholesterol (Patient not taking:  Reported on 01/10/2022), Disp: , Rfl: 1   SUPREP BOWEL PREP KIT 17.5-3.13-1.6 GM/177ML SOLN, See admin instructions. (Patient not taking: Reported on 01/08/2022), Disp: , Rfl: 0   terbinafine (LAMISIL) 250 MG tablet, Take 1 tablet (250 mg total) by mouth daily., Disp: 45 tablet, Rfl: 0   tiZANidine (ZANAFLEX) 4 MG tablet, Take 1 tablet (4 mg total) by mouth at bedtime., Disp: 30 tablet, Rfl: 0   VICTOZA 18 MG/3ML SOPN, SMARTSIG:0.3 Milliliter(s) SUB-Q Daily, Disp: , Rfl:    Vitamin D, Ergocalciferol, (DRISDOL) 50000 units CAPS capsule, TAKE ONE CAPSULE BY MOUTH ONCE A WEEK FOR 12 WEEKS, Disp: , Rfl: 3   No Known Allergies  Past Medical History:  Diagnosis Date   Diabetes mellitus    type 2   GERD (gastroesophageal reflux disease)    Hyperlipidemia    Hypertension      Past Surgical History:  Procedure Laterality Date   ACHILLES TENDON SURGERY Left 07/18/2015   Procedure: LEFT ACHILLES TENDON DIRECT PRIMARY REPAIR;  Surgeon: Kathryne Hitch, MD;  Location: [facility] OR;  Service: Orthopedics;  Laterality: Left;   COLONOSCOPY WITH PROPOFOL N/A 08/14/2017   Procedure: COLONOSCOPY WITH PROPOFOL;  Surgeon: Toney Reil, MD;  Location: [facility] ENDOSCOPY;  Service: Gastroenterology;  Laterality: N/A;   NO PAST SURGERIES      Family History  Problem Relation Age of Onset   Diabetes Mother    Diabetes Father    Diabetes Sister    Colon cancer Neg Hx    Stomach cancer Neg Hx    Esophageal cancer Neg Hx    Colon polyps Neg Hx     Social History   Tobacco Use   Smoking status: Every Day    Types: Cigars   Smokeless tobacco: Never  Vaping Use   Vaping status: Never Used  Substance Use Topics   Alcohol use: Yes    Comment: socially   Drug use: No    ROS   Objective:   Vitals: BP 126/87 (BP Location: Right Arm)   Pulse 84   Temp 99 F (37.2 C) (Oral)   Resp 20   SpO2 96%   Physical Exam Constitutional:      General: He is not in acute distress.    Appearance:  Normal appearance. He is well-developed. He is not ill-appearing, toxic-appearing or diaphoretic.  HENT:     Head: Normocephalic and atraumatic.     Right Ear: External ear normal.     Left Ear: External ear normal.     Nose: Nose normal.     Mouth/Throat:     Mouth: Mucous membranes are moist.  Eyes:     General: No scleral icterus.       Right eye: No discharge.        Left eye: No discharge.     Extraocular Movements: Extraocular movements intact.  Cardiovascular:  Rate and Rhythm: Normal rate and regular rhythm.     Heart sounds: Normal heart sounds. No murmur heard.    No friction rub. No gallop.  Pulmonary:     Effort: Pulmonary effort is normal. No respiratory distress.     Breath sounds: Normal breath sounds. No stridor. No wheezing, rhonchi or rales.  Neurological:     Mental Status: He is alert and oriented to person, place, and time.  Psychiatric:        Mood and Affect: Mood normal.        Behavior: Behavior normal.        Thought Content: Thought content normal.     ED ECG REPORT   Date: 05/12/2023  EKG Time: 9:31 AM  Rate: 85bpm  Rhythm: normal sinus rhythm  Axis: rightward  Intervals:none  ST&T Change: T wave inversion in lead III, aVF, V5, V6  Narrative Interpretation: Sinus rhythm at 85 bpm with a rightward axis deviation, T wave inversion in inferior lateral leads as above.  This is starkly different from prior EKGs.  Assessment and Plan :   PDMP not reviewed this encounter.  1. Atypical chest pain   2. Nonspecific abnormal electrocardiogram (ECG) (EKG)    Patient has significant cardiac risk factors including his diabetes, smoking, hypertension, hyperlipidemia.  EKG is concerning for possible inferolateral ischemia.  I deferred transport by EMS as patient had no active chest pain in the clinic and vital signs were hemodynamically stable.  I reviewed my concerns with the patient, he verbalized understanding and is agreeable to present to the  emergency room for cardiac workup.   Wallis Bamberg, [place] 05/12/23 [phone]

## 2023-05-12 NOTE — ED Provider Notes (Signed)
[place] EMERGENCY DEPARTMENT AT [facility] Provider Note   CSN: [id number] Arrival date & time: 05/12/23  1029     History  Chief Complaint  Patient presents with   Chest Pain    Christopher Abbott is a 46 y.o. male with PMHx DM, GERD, HTN, HLD who presents to ED concerned with chest tightness x1 week. Symptoms get worse when angry. Patient concerned because his significant other died from cardiac problems years ago.  Does endorse some left leg tightness that has been chronic for a long time after his Achilles tendon rupture surgery.  Denies fever, chest pain, dyspnea, cough, nausea, vomiting, diarrhea, dysuria, hematuria. Denies recent surgery/immobilization, hx DVT/PE, hemoptysis, hx cancer in the past 6 months, calf swelling/tenderness.     Chest Pain      Home Medications Prior to Admission medications   Medication Sig Start Date End Date Taking? Authorizing Provider  ACCU-CHEK FASTCLIX LANCETS MISC USE AS DIRECTED TO CHECK BLOOD SUAGR DX: E11.9 08/28/17   [provider]  ACCU-CHEK GUIDE test strip USE AS DIRECTED TO TEST BLOOD SUGAR TWICE A DAY DX:E11.9 08/29/17   [provider]  acetaminophen (TYLENOL) 325 MG tablet Take 2 tablets (650 mg total) by mouth every 6 (six) hours as needed for moderate pain. Patient not taking: Reported on 01/08/2022 06/12/21   Wallis Bamberg, PA-C  atorvastatin (LIPITOR) 40 MG tablet Take 40 mg by mouth at bedtime. 11/09/21   [provider]  B-D ULTRAFINE III SHORT PEN 31G X 8 MM MISC USE WITH INSULIN PEN INJECTIONS DAILY ( DX: E11.9) 09/17/17   [provider]  benzonatate (TESSALON) 200 MG capsule Take 1 capsule (200 mg total) by mouth 3 (three) times daily as needed for cough. 07/29/22   Radford Pax, NP  Bismuth 262 MG CHEW Chew 524 mg by mouth 4 (four) times daily. 01/16/22   Jenel Lucks, MD  Blood Glucose Monitoring Suppl (ACCU-CHEK GUIDE) w/Device KIT USE AS DIRECTED DX E11.9 (MEDICAID  PREFERRED) 08/28/17   [provider]  clonazePAM (KLONOPIN) 1 MG tablet Take 1 mg by mouth 2 (two) times daily.    [provider]  dexlansoprazole (DEXILANT) 60 MG capsule Take 1 capsule by mouth daily. 12/07/21   [provider]  doxycycline (VIBRAMYCIN) 100 MG capsule Take 1 capsule (100 mg total) by mouth 2 (two) times daily. 01/16/22   Jenel Lucks, MD  esomeprazole (NEXIUM) 20 MG capsule Take 20 mg by mouth daily at 12 noon.    [provider]  fenofibrate (TRICOR) 145 MG tablet Take 145 mg by mouth daily. 05/08/23   [provider]  HYDROcodone bit-homatropine (HYCODAN) 5-1.5 MG/5ML syrup Take 5 mLs by mouth every 6 (six) hours as needed for cough. 07/31/22   Carlisle Beers, FNP  HYDROcodone-acetaminophen (NORCO/VICODIN) 5-325 MG tablet Take 2 tablets by mouth every 4 (four) hours as needed. 11/21/19   Moshe Cipro, FNP  hydrocortisone (ANUSOL-HC) 2.5 % rectal cream Apply rectally 2 times daily Patient not taking: Reported on 06/12/2021 02/07/17   Elvina Sidle, MD  insulin glargine (LANTUS) 100 UNIT/ML injection Inject 70 Units into the skin daily.     [provider]  LANTUS SOLOSTAR 100 UNIT/ML Solostar Pen INJECT 45 UNITS INTO THE SKIN 4 TIMES A DAY 08/09/17   [provider]  levocetirizine (XYZAL) 5 MG tablet Take 1 tablet (5 mg total) by mouth every evening. 06/12/21   Wallis Bamberg, PA-C  lisinopril-hydrochlorothiazide (PRINZIDE,ZESTORETIC) 20-12.5 MG tablet  Take 1 tablet by mouth daily. for high blood pressure 07/02/15   [provider]  meloxicam (MOBIC) 15 MG tablet Take 1 tablet (15 mg total) by mouth daily. 04/16/21   Kathryne Hitch, MD  metFORMIN (GLUCOPHAGE) 1000 MG tablet Take 1,000 mg by mouth 2 (two) times daily with a meal.    [provider]  metoprolol tartrate (LOPRESSOR) 100 MG tablet Take 100 mg 2 hours before Coronary CT Patient not taking: Reported on 01/08/2022 06/19/21    [place], Peter M, MD  metroNIDAZOLE (FLAGYL) 250 MG tablet Take 1 tablet (250 mg total) by mouth 4 (four) times daily. 01/16/22   Jenel Lucks, MD  omeprazole (PRILOSEC) 20 MG capsule Take 1 capsule (20 mg total) by mouth 2 (two) times daily before a meal. 01/16/22   Jenel Lucks, MD  pravastatin (PRAVACHOL) 40 MG tablet Take 40 mg by mouth every evening. for cholesterol Patient not taking: Reported on 01/10/2022 05/30/15   [provider]  SUPREP BOWEL PREP KIT 17.5-3.13-1.6 GM/177ML SOLN See admin instructions. Patient not taking: Reported on 01/08/2022 08/12/17   [provider]  terbinafine (LAMISIL) 250 MG tablet Take 1 tablet (250 mg total) by mouth daily. 05/09/21   Lenn Sink, DPM  tiZANidine (ZANAFLEX) 4 MG tablet Take 1 tablet (4 mg total) by mouth at bedtime. 06/12/21   Wallis Bamberg, PA-C  VICTOZA 18 MG/3ML SOPN SMARTSIG:0.3 Milliliter(s) SUB-Q Daily 12/25/21   [provider]  Vitamin D, Ergocalciferol, (DRISDOL) 50000 units CAPS capsule TAKE ONE CAPSULE BY MOUTH ONCE A WEEK FOR 12 WEEKS 08/09/17   [provider]      Allergies    Patient has no known allergies.    Review of Systems   Review of Systems  Cardiovascular:  Positive for chest pain.    Physical Exam Updated Vital Signs BP (!) 132/97 (BP Location: Right Arm)   Pulse 75   Temp 97.9 F (36.6 C) (Oral)   Resp 20   Ht 6\' 4"  (1.93 m)   Wt 117.9 kg   SpO2 98%   BMI 31.65 kg/m  Physical Exam Vitals and nursing note reviewed.  Constitutional:      General: He is not in acute distress.    Appearance: He is not ill-appearing, toxic-appearing or diaphoretic.  HENT:     Head: Normocephalic and atraumatic.     Mouth/Throat:     Mouth: Mucous membranes are moist.     Pharynx: No oropharyngeal exudate or posterior oropharyngeal erythema.  Eyes:     General: No scleral icterus.       Right eye: No discharge.        Left eye: No discharge.     Conjunctiva/sclera:  Conjunctivae normal.  Cardiovascular:     Rate and Rhythm: Normal rate.     Pulses: Normal pulses.     Heart sounds: Normal heart sounds. No murmur heard. Pulmonary:     Effort: Pulmonary effort is normal. No respiratory distress.     Breath sounds: Normal breath sounds. No wheezing, rhonchi or rales.  Abdominal:     General: Bowel sounds are normal.     Palpations: Abdomen is soft. There is no mass.     Tenderness: There is no abdominal tenderness.  Musculoskeletal:     Right lower leg: No edema.     Left lower leg: No edema.     Comments: No calf tenderness to palpation  Skin:    General: Skin is warm  and dry.     Findings: No rash.  Neurological:     General: No focal deficit present.     Mental Status: He is alert and oriented to person, place, and time. Mental status is at baseline.  Psychiatric:        Mood and Affect: Mood normal.        Behavior: Behavior normal.     ED Results / Procedures / Treatments   Labs (all labs ordered are listed, but only abnormal results are displayed) Labs Reviewed  BASIC METABOLIC PANEL - Abnormal; Notable for the following components:      Result Value   Glucose, Bld 262 (*)    All other components within normal limits  CBC  D-DIMER, QUANTITATIVE  TROPONIN I (HIGH SENSITIVITY)  TROPONIN I (HIGH SENSITIVITY)    EKG None  Radiology DG Chest 2 View Result Date: 05/12/2023 CLINICAL DATA:  Intermittent chest tightness. EXAM: CHEST - 2 VIEW COMPARISON:  Chest radiograph dated August 06, 2019. FINDINGS: The heart size and mediastinal contours are within normal limits. Both lungs are clear. No pleural effusion or pneumothorax. No acute osseous abnormality. IMPRESSION: No active cardiopulmonary disease. Electronically Signed   By: Hart Robinsons M.D.   On: 05/12/2023 11:49    Procedures Procedures    Medications Ordered in ED Medications - No data to display  ED Course/ Medical Decision Making/ A&P Clinical Course as of  05/12/23 1454  Mon May 12, 2023  1435 EKG per my interpretation shows normal sinus rhythm with T wave inversions in inferior lateral leads that were noted in prior EKG tracings dating back to 2021 and 2017.  There were no acute ischemic findings.  These are nonspecific T wave inversions. [MT]  1436 Patient did have a coronary CT scan performed in January 2023 after seeing cardiology for chest pain and nonspecific T wave changes.  He had a coronary calcium score of 0 with no significant coronary calcium disease noted.  This seems to be unlikely an issue of ACS. [MT]    Clinical Course User Index [MT] Trifan, Kermit Balo, MD                                 Medical Decision Making Amount and/or Complexity of Data Reviewed Labs: ordered. Radiology: ordered.   This patient presents to the ED for concern of chest pain, this involves an extensive number of treatment options, and is a complaint that carries with it a high risk of complications and morbidity.  The differential diagnosis includes acute coronary syndrome, congestive heart failure, pericarditis, pneumonia, pulmonary embolism, tension pneumothorax, esophageal rupture, aortic dissection, cardiac tamponade, musculoskeletal   Co morbidities that complicate the patient evaluation   DM, GERD, HTN, HLD   Additional history obtained:  PCP Dr. Mayford Knife   Lab Tests:  I Ordered, and personally interpreted labs.  The pertinent results include:  - Troponin: initial within normal limits - BMP: hyperglycemia at 262 - CBC: No concern for anemia or leukocytosis - D Dimer: pending   Imaging Studies ordered:  I ordered imaging studies including  -chest xray: To assess for process contributing patient's symptoms I independently visualized and interpreted imaging  I agree with the radiologist interpretation   Cardiac Monitoring: / EKG:  The patient was maintained on a cardiac monitor.  I personally viewed and interpreted the cardiac  monitored which showed an underlying rhythm of: No changes from baseline.  Sinus rhythm without acute ST changes    Problem List / ED Course / Critical interventions / Medication management  Patient presents to ED concern for chest tightness x 1 week.  Denying any other infectious symptoms.  Patient was sent here by urgent care for abnormal EKG findings.  This EKG is included in patient's chart above.  EKG shows some nonspecific T wave changes.  Chart review showing same changes in prior EKGs.  Patient is afebrile with stable vitals.  Patient did have initial tachycardia and hypertension which resolved without medication. CBC without leukocytosis or anemia.  BMP reassuring.  Initial troponin within normal limits.  Chest x-ray without acute cardiopulmonary disease concerns.  EKG reassuring. Shared results with patient.  Patient stating that he has extreme anxiety about his chest tightness given that his significant other died years ago from heart problems despite being seen multiple times with the emergency room.  Shared decision making with patient who agreed to undergo D-dimer testing to further assess for blood clot causing patient's symptoms.  I have very low suspicion that there is a PE.  Patient understands that if the D-dimer is positive he will need a CTA of the chest to further rule out pulmonary embolism. I have reviewed the patients home medicines and have made adjustments as needed Patient was given return precautions. Patient stable for discharge at this time.  Patient verbalized understanding of plan.   Social Determinants of Health:  none  3PM Care of [facility] transferred to Dr. Miguel Dibble at the end of my shift as the patient will require reassessment once labs/imaging have resulted. Patient presentation, ED course, and plan of care discussed with review of all pertinent labs and imaging. Please see his/her note for further details regarding further ED course and disposition. Plan at  time of handoff is reassess patient after d-dimer and possible CTA if d-dimer is positive. This may be altered or completely changed at the discretion of the oncoming team pending results of further workup.         Final Clinical Impression(s) / ED Diagnoses Final diagnoses:  None    Rx / DC Orders ED Discharge Orders     None         Dorthy Cooler, [place] 05/12/23 1454    Terald Sleeper, MD 05/12/23 [phone]

## 2023-05-12 NOTE — ED Triage Notes (Signed)
Pt c/o intermittent left side CP/tightness x 1 week-denies pain at present-NAD-steady gait

## 2023-05-12 NOTE — ED Notes (Signed)
Patient is being discharged from the Urgent Care and sent to the Emergency Department via POV . Per Urban Gibson, PA-C, patient is in need of higher level of care due to CP. Patient is aware and verbalizes understanding of plan of care.  Vitals:   05/12/23 0908 05/12/23 0912  BP: (!) 142/93 126/87  Pulse: 84   Resp: 20   Temp: 99 F (37.2 C)   SpO2: 96%

## 2023-05-12 NOTE — ED Triage Notes (Signed)
Patient sent in by UC for concerning EKG and intermittent chest tightness since last Sunday. Patient was concerned because of hx of high triglycerides. EKG performed and provider reviewed. No chest pain at this time.

## 2023-05-12 NOTE — ED Provider Notes (Signed)
Patient care assumed from previous provider.   Patient care of Christopher Abbott is a 47 y.o. male from previous provider. Please see the original provider note from this emergency department encounter for full history and physical.   Course of Care and my assessment at the time of sign out is detailed in the ED Course below.   Clinical Course as of 05/12/23 1514  Mon May 12, 2023  1435 EKG per my interpretation shows normal sinus rhythm with T wave inversions in inferior lateral leads that were noted in prior EKG tracings dating back to 2021 and 2017.  There were no acute ischemic findings.  These are nonspecific T wave inversions. [MT]  1436 Patient did have a coronary CT scan performed in January 2023 after seeing cardiology for chest pain and nonspecific T wave changes.  He had a coronary calcium score of 0 with no significant coronary calcium disease noted.  This seems to be unlikely an issue of ACS. [MT]  1500 Stable 43M htn hld he is here for chest tightness happening over a week, sent from urgent care. Non specific t wave, consistent with prior. CT angio in January that was negative. Concerned.   Follow up with ddimer, troponin [JL]    Clinical Course User Index [JL] Gunnar Bulla, MD [MT] Christopher Rakers Kermit Balo, MD    Labs reviewed by myself and considered in medical decision making.  Imaging reviewed by myself and considered in medical decision making. Imaging final read interpreted by radiology.  No diagnosis found.   I assumed care of this patient, please see provider note from before, but essentially here with chest pain does have a history of hypertension hyperlipidemia, had a CT PE study back in January which is negative.  Previous provider had ordered a D-dimer which was negative, effectively ruling out any suspicion of pulmonary embolism, 2 negative troponins, other lab works are normal, his glucose is 262, but there is no anion gap to suggest DKA.  Chest x-ray and EKG are clear, he  had a T wave inversion however this was consistent with prior.  The patient can be safely discharged at this time.  He will follow-up primary care, he does not need inpatient admission for any other additional workup.  I discussed this case with my attending     Gunnar Bulla, MD 05/12/23 1612    Blane Ohara, MD 05/13/23 [phone]

## 2023-05-12 NOTE — Discharge Instructions (Addendum)
You were seen today for chest pain. While you were here we monitored your vitals, preformed a physical exam, and blood work. These were all reassuring and there is no indication for any further testing or intervention in the emergency department at this time.   Things to do:  - Follow up with your primary care provider within the next 1-2 weeks  Return to the emergency department if you have any new or worsening symptoms, or if you have any other concerns.

## 2023-05-29 ENCOUNTER — Ambulatory Visit: Payer: Medicaid Other | Attending: Physician Assistant | Admitting: Physician Assistant

## 2023-05-29 VITALS — BP 120/80 | HR 92 | Ht 76.0 in | Wt 267.0 lb

## 2023-05-29 DIAGNOSIS — R0789 Other chest pain: Secondary | ICD-10-CM | POA: Diagnosis not present

## 2023-05-29 DIAGNOSIS — L299 Pruritus, unspecified: Secondary | ICD-10-CM | POA: Diagnosis not present

## 2023-05-29 DIAGNOSIS — E781 Pure hyperglyceridemia: Secondary | ICD-10-CM | POA: Diagnosis not present

## 2023-05-29 DIAGNOSIS — Z79899 Other long term (current) drug therapy: Secondary | ICD-10-CM

## 2023-05-29 NOTE — Patient Instructions (Addendum)
 Medication Instructions:  Your physician recommends that you continue on your current medications as directed. Please refer to the Current Medication list given to you today.  *If you need a refill on your cardiac medications before your next appointment, please call your pharmacy*   Lab Work: Liver function If you have labs (blood work) drawn today and your tests are completely normal, you will receive your results only by: MyChart Message (if you have MyChart) OR A paper copy in the mail If you have any lab test that is abnormal or we need to change your treatment, we will call you to review the results.   Testing/Procedures: Your physician has requested that you have an echocardiogram. Echocardiography is a painless test that uses sound waves to create images of your heart. It provides your doctor with information about the size and shape of your heart and how well your heart's chambers and valves are working. This procedure takes approximately one hour. There are no restrictions for this procedure. Please do NOT wear cologne, perfume, aftershave, or lotions (deodorant is allowed). Please arrive 15 minutes prior to your appointment time.  Please note: We ask at that you not bring children with you during ultrasound (echo/ vascular) testing. Due to room size and safety concerns, children are not allowed in the ultrasound rooms during exams. Our front office staff cannot provide observation of children in our lobby area while testing is being conducted. An adult accompanying a patient to their appointment will only be allowed in the ultrasound room at the discretion of the ultrasound technician under special circumstances. We apologize for any inconvenience.    Follow-Up: At [facility], you and your health needs are our priority.  As part of our continuing mission to provide you with exceptional heart care, we have created designated Provider Care Teams.  These Care Teams include  your primary Cardiologist (physician) and Advanced Practice Providers (APPs -  Physician Assistants and Nurse Practitioners) who all work together to provide you with the care you need, when you need it.    Your next appointment:    As needed pending test results

## 2023-05-29 NOTE — Progress Notes (Signed)
 Cardiology Office Note:  .   Date:  05/29/2023  ID:  Christopher Abbott, DOB 1975/08/31, MRN [id number] PCP: Trudy Herring, MD  [facility] Providers Cardiologist:  Peter Jordan, MD     History of Present Illness: .   Christopher Abbott is a 48 y.o. male with past medical history of hypertension, DM2, tobacco abuse and history of chest pain.  Patient was seen by Dr. Jordan in January 2023 for evaluation of chest pain.  Subsequent coronary CT obtained on 06/26/2021 demonstrated coronary calcium score of 0, no evidence of CAD, no significant extracardiac finding.  More recently, patient was seen in the ED on 05/12/2023 for chest pain for 1 week.  Serial troponin was negative x 2.  D-dimer negative.  Patient was discharged from the ED.  Patient presents today for follow-up.  His chest pain has been going on for a long time, this is similar to the chest pain from last year.  EKG obtained recently showed T wave inversion in the lateral leads which is similar to previous EKG from January 2023 as well.  Given the atypical nature and a negative workup, I did not recommend any further ischemic workup.  Patient is concerned about recurrence of the chest discomfort and make sure 1 to make sure his heart is doing all right, I will order echocardiogram.  He has been complaining of some itchiness all over, he was recently started on fenofibrate and atorvastatin due to elevated triglyceride level, triglyceride reportedly came down from 800 down to 200.  Given generalized itchiness, I will order liver function test.  He does not have any jaundice on exam.  ROS:   He denies palpitations, dyspnea, pnd, orthopnea, n, v, dizziness, syncope, edema, weight gain, or early satiety. All other systems reviewed and are otherwise negative except as noted above.  Patient continued to complain of intermittent chest pain.   Studies Reviewed: .        Cardiac Studies & Procedures         CT SCANS  CT CORONARY MORPH W/CTA  COR W/SCORE 06/26/2021  Addendum 06/26/2021  1:40 PM ADDENDUM REPORT: 06/26/2021 13:38  CLINICAL DATA:  This is a 48 year old male with chest pain.  EXAM: Cardiac/Coronary  CTA  TECHNIQUE: The patient was scanned on a [employer].  FINDINGS: A 100 kV prospective scan was triggered in the descending thoracic aorta at 111 HU's. Axial non-contrast 3 mm slices were carried out through the heart. The data set was analyzed on a dedicated work station and scored using the Agatson method. Gantry rotation speed was 250 msecs and collimation was .6 mm. No beta blockade and 0.8 mg of sl NTG was given. The 3D data set was reconstructed in 5% intervals of the 67-82 % of the R-R cycle. Diastolic phases were analyzed on a dedicated work station using MPR, MIP and VRT modes. The patient received 80 cc of contrast.  Aorta: Normal size.  No calcifications.  No dissection.  Aortic Valve:  Trileaflet.  No calcifications.  Coronary Arteries:  Normal coronary origin.  Right dominance.  RCA is a large dominant artery that gives rise to PDA and PLA. There is no plaque.  Left main is a large artery that gives rise to LAD, Intermedius Ramus and LCX arteries.  LAD is a large vessel that has no plaque.  LCX is a non-dominant artery that gives rise to one large OM1 branch. There is no plaque.  Coronary Calcium Score:  Left main:  0  Left anterior descending artery: 0  Left circumflex artery: 0  Right coronary artery: 0  Total: 0  Percentile: 0  Other findings:  Normal pulmonary vein drainage into the left atrium.  Normal left atrial appendage without a thrombus.  Normal size of the pulmonary artery.  IMPRESSION: 1. Coronary calcium score of 0. This was 0 percentile for age and sex matched control.  2. Normal coronary origin with right dominance.  3. No evidence of CAD. CAD-RADS 0. No evidence of CAD (0%). Consider non-atherosclerotic causes of chest  pain.   Electronically Signed By: Kardie  Tobb D.O. On: 06/26/2021 13:38  Narrative EXAM: OVER-READ INTERPRETATION  CT CHEST  The following report is an over-read performed by radiologist Dr. Camellia Candle of [facility] on 06/26/2021. This over-read does not include interpretation of cardiac or coronary anatomy or pathology. The coronary CTA interpretation by the cardiologist is attached.  COMPARISON:  CTA Chest 08/06/2019  FINDINGS: Vascular: No dissection flap within the visualized thoracic aorta. No large central pulmonary embolus.  Mediastinum/Nodes: Visualized mediastinum and hilar regions demonstrate no lymphadenopathy.  Lungs/Pleura: No suspicious pulmonary nodule or mass. No evidence for focal consolidation or pleural effusion.  Upper Abdomen: Unremarkable.  Musculoskeletal: No worrisome lytic or sclerotic osseous abnormality.  IMPRESSION: No acute or clinically significant extracardiac findings.  Electronically Signed: By: Camellia Candle M.D. On: 06/26/2021 09:36          Risk Assessment/Calculations:             Physical Exam:   VS:  BP 120/80 (BP Location: Right Arm, Patient Position: Sitting, Cuff Size: Large)   Pulse 92   Ht 6' 4 (1.93 m)   Wt 267 lb (121.1 kg)   SpO2 96%   BMI 32.50 kg/m    Wt Readings from Last 3 Encounters:  05/29/23 267 lb (121.1 kg)  05/12/23 260 lb (117.9 kg)  01/10/22 273 lb (123.8 kg)    GEN: Well nourished, well developed in no acute distress NECK: No JVD; No carotid bruits CARDIAC: RRR, no murmurs, rubs, gallops RESPIRATORY:  Clear to auscultation without rales, wheezing or rhonchi  ABDOMEN: Soft, non-tender, non-distended EXTREMITIES:  No edema; No deformity   ASSESSMENT AND PLAN: .    Chest Pain Chronic, non-exertional chest pain. No change in character or frequency. Prior coronary CT in 05/2021 showed no evidence of CAD. Recent ED visit on 05/11/2022 with negative troponins. EKG shows T-wave  inversion in lateral leads, unchanged from prior EKGs. -Order echocardiogram to assess cardiac function and wall motion.  If echocardiogram is normal, no further workup.  Hypertriglyceridemia Recent significant reduction in triglycerides from 800 to 200 with fenofibrate and atorvastatin. -Continue fenofibrate and atorvastatin.  Rash New onset, migratory rash since starting fenofibrate and atorvastatin. No jaundice noted. -Order liver function tests to assess for potential drug-induced hepatotoxicity. -If liver function tests are normal, continue current medications.  Follow-up As needed, pending results of echocardiogram and liver function tests.       Dispo: Follow-up as needed  Signed, Diamond Martucci, PA

## 2023-05-30 LAB — HEPATIC FUNCTION PANEL
ALT: 20 [IU]/L (ref 0–44)
AST: 19 [IU]/L (ref 0–40)
Albumin: 4.5 g/dL (ref 4.1–5.1)
Alkaline Phosphatase: 61 [IU]/L (ref 44–121)
Bilirubin Total: 0.3 mg/dL (ref 0.0–1.2)
Bilirubin, Direct: 0.13 mg/dL (ref 0.00–0.40)
Total Protein: 7.2 g/dL (ref 6.0–8.5)

## 2023-06-02 ENCOUNTER — Ambulatory Visit (HOSPITAL_COMMUNITY)
Admission: RE | Admit: 2023-06-02 | Discharge: 2023-06-02 | Disposition: A | Discharge status: Home / Self Care | Payer: Medicaid Other | Source: Ambulatory Visit | Attending: Physician Assistant | Admitting: Physician Assistant

## 2023-06-02 DIAGNOSIS — R0789 Other chest pain: Secondary | ICD-10-CM | POA: Diagnosis present

## 2023-06-02 LAB — ECHOCARDIOGRAM COMPLETE
AR max vel: 2.62 cm2
AV Area VTI: 2.56 cm2
AV Area mean vel: 2.51 cm2
AV Mean grad: 3 mm[Hg]
AV Peak grad: 4.8 mm[Hg]
Ao pk vel: 1.1 m/s
Area-P 1/2: 3.19 cm2
S' Lateral: 4.09 cm

## 2023-06-03 ENCOUNTER — Telehealth: Payer: Self-pay | Admitting: Cardiology

## 2023-06-03 NOTE — Telephone Encounter (Signed)
 Pt would like a call back with his Echo Results

## 2023-06-03 NOTE — Telephone Encounter (Signed)
 Patient identification verified by 2 forms. Shirl Ellen, RN     Explained to patient that results are automatically released to mychart but the ordering provider has not reviewed the impression and made their final notes. Informed patient his provider or their nurse would contact him with the results.   Patient verbalized understanding. No further questions at this time.

## 2023-06-04 NOTE — Progress Notes (Signed)
 Please arrange a earlier visit to discuss echo result

## 2023-06-07 NOTE — Progress Notes (Deleted)
 Cardiology Office Note   Date:  06/07/2023   ID:  Christopher Abbott, DOB 03-Apr-1976, MRN [id number]  PCP:  Jed Miners, MD  Cardiologist:   Irie Fiorello [place], MD   No chief complaint on file.     History of Present Illness: Christopher Abbott is a 48 y.o. male who is seen for follow up.  He has a history of DM and HTN. Also history of tobacco abuse. He was seen in the ED on 06/12/21 for evaluation of chest pain. Ecg showed no acute change from prior - T wave inversion in lateral precordial leads with LVH. I saw him at that time and we performed coronary CTA which was normal.  More recently seen by Ervin Heath PA-C this month with recurrent chest pain. Had been seen in ED with negative troponins and no change in Ecg. CXR was clear. Echo was ordered showing mildly decreased LV function with EF 45-50% and global HK. Seen today to discuss results.   He reports he has been having chest tightness for one month. Localized to left breast. May be aggravated by stress. Not with exertion. No relief except with Klonopin. 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     Past Medical History:  Diagnosis Date   Diabetes mellitus    type 2   GERD (gastroesophageal reflux disease)    Hyperlipidemia    Hypertension     Past Surgical History:  Procedure Laterality Date   ACHILLES TENDON SURGERY Left 07/18/2015   Procedure: LEFT ACHILLES TENDON DIRECT PRIMARY REPAIR;  Surgeon: Arnie Lao, MD;  Location: [facility] OR;  Service: Orthopedics;  Laterality: Left;   COLONOSCOPY WITH PROPOFOL  N/A 08/14/2017   Procedure: COLONOSCOPY WITH PROPOFOL ;  Surgeon: Selena Daily, MD;  Location: [facility] ENDOSCOPY;  Service: Gastroenterology;  Laterality: N/A;   NO PAST SURGERIES       Current Outpatient Medications  Medication Sig Dispense Refill   ACCU-CHEK FASTCLIX LANCETS MISC USE AS DIRECTED TO CHECK BLOOD SUAGR DX: E11.9  11   ACCU-CHEK GUIDE test strip USE AS DIRECTED TO  TEST BLOOD SUGAR TWICE A DAY DX:E11.9  11   ASPIRIN  LOW DOSE 81 MG tablet Take 81 mg by mouth daily.     atorvastatin (LIPITOR) 40 MG tablet Take 40 mg by mouth at bedtime.     B-D ULTRAFINE III SHORT PEN 31G X 8 MM MISC USE WITH INSULIN  PEN INJECTIONS DAILY ( DX: E11.9)  11   benzonatate  (TESSALON ) 200 MG capsule Take 1 capsule (200 mg total) by mouth 3 (three) times daily as needed for cough. 20 capsule 0   Blood Glucose Monitoring Suppl (ACCU-CHEK GUIDE) w/Device KIT USE AS DIRECTED DX E11.9 (MEDICAID PREFERRED)  0   clonazePAM (KLONOPIN) 1 MG tablet Take 1 mg by mouth daily as needed for anxiety.     dexlansoprazole (DEXILANT) 60 MG capsule Take 1 capsule by mouth daily.     doxycycline  (VIBRAMYCIN ) 100 MG capsule Take 1 capsule (100 mg total) by mouth 2 (two) times daily. 28 capsule 0   fenofibrate (TRICOR) 145 MG tablet Take 145 mg by mouth daily.     gabapentin (NEURONTIN) 300 MG capsule Take 300 mg by mouth daily as needed (for pain).     insulin  glargine (LANTUS ) 100 UNIT/ML injection Inject 50 Units into the skin daily as needed (for low blod sugar).     lisinopril -hydrochlorothiazide  (PRINZIDE ,ZESTORETIC ) 20-12.5 MG tablet Take 1 tablet by mouth daily. for high blood pressure  5   metFORMIN  (GLUCOPHAGE ) 1000 MG tablet Take 1,000 mg by mouth 2 (two) times daily with a meal.     metoprolol  tartrate (LOPRESSOR ) 100 MG tablet Take 100 mg 2 hours before Coronary CT (Patient not taking: Reported on 05/29/2023) 1 tablet 0   metroNIDAZOLE  (FLAGYL ) 250 MG tablet Take 1 tablet (250 mg total) by mouth 4 (four) times daily. 56 tablet 0   nitroGLYCERIN  (NITROSTAT ) 0.4 MG SL tablet Place 0.4 mg under the tongue every 5 (five) minutes as needed for chest pain.     omeprazole  (PRILOSEC) 20 MG capsule Take 1 capsule (20 mg total) by mouth 2 (two) times daily before a meal. 28 capsule 0   SUPREP BOWEL PREP KIT 17.5-3.13-1.6 GM/177ML SOLN See admin instructions.  0   terbinafine  (LAMISIL ) 250 MG tablet Take  1 tablet (250 mg total) by mouth daily. (Patient not taking: Reported on 05/29/2023) 45 tablet 0   VICTOZA  18 MG/3ML SOPN Inject 1.2 mg into the skin once a week.     Vitamin D, Ergocalciferol, (DRISDOL) 50000 units CAPS capsule TAKE ONE CAPSULE BY MOUTH ONCE A WEEK FOR 12 WEEKS  3   No current facility-administered medications for this visit.    Allergies:   Patient has no known allergies.    Social History:  The patient  reports that he has been smoking cigars. He has never used smokeless tobacco. He reports current alcohol use. He reports that he does not use drugs.   Family History:  The patient's family history includes Diabetes in his father, mother, and sister.    ROS:  Please see the history of present illness.   Otherwise, review of systems are positive for none.   All other systems are reviewed and negative.    PHYSICAL EXAM: VS:  There were no vitals taken for this visit. , BMI There is no height or weight on file to calculate BMI. GEN: Well nourished, well developed, in no acute distress HEENT: normal Neck: no JVD, carotid bruits, or masses Cardiac: RRR; no murmurs, rubs, or gallops,no edema  Respiratory:  clear to auscultation bilaterally, normal work of breathing GI: soft, nontender, nondistended, + BS MS: no deformity or atrophy Skin: warm and dry, no rash Neuro:  Strength and sensation are intact Psych: euthymic mood, full affect   EKG:  EKG is ordered today. The ekg ordered today demonstrates NSR with nonspecific TWA. I have personally reviewed and interpreted this study.    Recent Labs: 05/12/2023: BUN 8; Creatinine, Ser 1.05; Hemoglobin 14.9; Platelets 241; Potassium 3.8; Sodium 135 05/29/2023: ALT 20    Lipid Panel No results found for: "CHOL", "TRIG", "HDL", "CHOLHDL", "VLDL", "LDLCALC", "LDLDIRECT"    Wt Readings from Last 3 Encounters:  05/29/23 267 lb (121.1 kg)  05/12/23 260 lb (117.9 kg)  01/10/22 273 lb (123.8 kg)      Other studies  Reviewed: Additional studies/ records that were reviewed today include:   Coronary CTA 06/25/21:  Cardiac/Coronary  CTA   TECHNIQUE: The patient was scanned on a [employer].   FINDINGS: A 100 kV prospective scan was triggered in the descending thoracic aorta at 111 HU's. Axial non-contrast 3 mm slices were carried out through the heart. The data set was analyzed on a dedicated work station and scored using the Agatson method. Gantry rotation speed was 250 msecs and collimation was .6 mm. No beta blockade and 0.8 mg of sl NTG was given. The 3D data set was reconstructed in 5% intervals  of the 67-82 % of the R-R cycle. Diastolic phases were analyzed on a dedicated work station using MPR, MIP and VRT modes. The patient received 80 cc of contrast.   Aorta: Normal size.  No calcifications.  No dissection.   Aortic Valve:  Trileaflet.  No calcifications.   Coronary Arteries:  Normal coronary origin.  Right dominance.   RCA is a large dominant artery that gives rise to PDA and PLA. There is no plaque.   Left main is a large artery that gives rise to LAD, Intermedius Ramus and LCX arteries.   LAD is a large vessel that has no plaque.   LCX is a non-dominant artery that gives rise to one large OM1 branch. There is no plaque.   Coronary Calcium Score:   Left main: 0   Left anterior descending artery: 0   Left circumflex artery: 0   Right coronary artery: 0   Total: 0   Percentile: 0   Other findings:   Normal pulmonary vein drainage into the left atrium.   Normal left atrial appendage without a thrombus.   Normal size of the pulmonary artery.   IMPRESSION: 1. Coronary calcium score of 0. This was 0 percentile for age and sex matched control.   2. Normal coronary origin with right dominance.   3. No evidence of CAD. CAD-RADS 0. No evidence of CAD (0%). Consider non-atherosclerotic causes of chest pain.     Electronically Signed   By: Kardie  Tobb  D.O.   On: 06/26/2021 13:38         Echo 06/02/23: IMPRESSIONS     1. Left ventricular ejection fraction, by estimation, is 45 to 50%. Left  ventricular ejection fraction by 3D volume is 45 %. The left ventricle has  mildly decreased function. The left ventricle demonstrates global  hypokinesis. There is moderate  asymmetric left ventricular hypertrophy of the basal-septal segment (15  mm), no LVOT obstruction or SAM. Left ventricular diastolic parameters are  consistent with Grade I diastolic dysfunction (impaired relaxation).   2. Right ventricular systolic function is normal. The right ventricular  size is normal. Tricuspid regurgitation signal is inadequate for assessing  PA pressure.   3. Grossly normal LA reservoir strain, 43%.   4. The mitral valve is normal in structure. Trivial mitral valve  regurgitation. No evidence of mitral stenosis.   5. The aortic valve is tricuspid. Aortic valve regurgitation is not  visualized. No aortic stenosis is present.   6. Aortic dilatation noted. There is mild dilatation of the ascending  aorta, measuring 43 mm.   7. The inferior vena cava is normal in size with greater than 50%  respiratory variability, suggesting right atrial pressure of 3 mmHg.     ASSESSMENT AND PLAN:  1.  Chest pain of uncertain etiology. Multiple risk factors including IDDM, HTN, HLD. Some tobacco use. Recommend ischemic evaluation. Discussed possible stress testing versus CTA. After discussion will go ahead and arrange for coronary CTA.  2. HTN controlled 3. IDDM 4. HLD. On pravastatin . Lipid levels unknown 5. Tobacco use - encourage smoking cessation.   Current medicines are reviewed at length with the patient today.  The patient does not have concerns regarding medicines.  The following changes have been made:  no change  Labs/ tests ordered today include:   No orders of the defined types were placed in this encounter.        Disposition:   FU with  me TBD  Signed, Ondine Gemme [place],  MD  06/07/2023 7:38 AM    [facility] [address], [place], [place], [address] Phone [phone], Fax [phone]

## 2023-06-10 ENCOUNTER — Ambulatory Visit: Payer: Medicaid Other | Admitting: Cardiology

## 2023-06-10 ENCOUNTER — Encounter: Payer: Self-pay | Admitting: Cardiology

## 2023-06-10 ENCOUNTER — Ambulatory Visit: Payer: Medicaid Other | Attending: Cardiology | Admitting: Cardiology

## 2023-06-10 VITALS — BP 140/86 | HR 92 | Ht 76.0 in | Wt 270.0 lb

## 2023-06-10 DIAGNOSIS — I10 Essential (primary) hypertension: Secondary | ICD-10-CM

## 2023-06-10 DIAGNOSIS — E781 Pure hyperglyceridemia: Secondary | ICD-10-CM

## 2023-06-10 DIAGNOSIS — R0789 Other chest pain: Secondary | ICD-10-CM

## 2023-06-10 DIAGNOSIS — E109 Type 1 diabetes mellitus without complications: Secondary | ICD-10-CM | POA: Diagnosis not present

## 2023-06-10 NOTE — Patient Instructions (Signed)
 Medication Instructions:  Your physician recommends that you continue on your current medications as directed. Please refer to the Current Medication list given to you today.  *If you need a refill on your cardiac medications before your next appointment, please call your pharmacy*    Follow-Up: At [facility], you and your health needs are our priority.  As part of our continuing mission to provide you with exceptional heart care, we have created designated Provider Care Teams.  These Care Teams include your primary Cardiologist (physician) and Advanced Practice Providers (APPs -  Physician Assistants and Nurse Practitioners) who all work together to provide you with the care you need, when you need it.      Your next appointment:   6 month(s)  Provider:   Peter [place], MD

## 2023-06-10 NOTE — Progress Notes (Signed)
 Cardiology Office Note   Date:  06/10/2023   ID:  Christopher Abbott, DOB Dec 31, 1975, MRN [id number]  PCP:  Trudy Herring, MD  Cardiologist:   Artie Mcintyre, MD   Chief Complaint  Patient presents with   Chest Pain      History of Present Illness: Christopher Abbott is a 48 y.o. male who is seen for follow up.  He has a history of DM and HTN. Also history of tobacco abuse. He was seen in the ED on 06/12/21 for evaluation of chest pain. Ecg showed no acute change from prior - T wave inversion in lateral precordial leads with LVH. I saw him at that time and we performed coronary CTA which was normal. Calcium score was 0.   More recently seen by Scot Ford PA-C  with recurrent chest pain. Had been seen in ED with negative troponins and no change in Ecg. CXR was clear. Echo was ordered showing mildly decreased LV function with EF 45-50% and global HK.   He notes he is under a great deal of stress. When he takes Klonopin at night and relaxes his chest pain resolves. Normally very active. Notes he was drinking a lot. Triglycerides went up to 842. Was placed on fenofibrate and level came down to 254. LDL was 63. He quit drinking and feels so much better.     Past Medical History:  Diagnosis Date   Diabetes mellitus    type 2   GERD (gastroesophageal reflux disease)    Hyperlipidemia    Hypertension     Past Surgical History:  Procedure Laterality Date   ACHILLES TENDON SURGERY Left 07/18/2015   Procedure: LEFT ACHILLES TENDON DIRECT PRIMARY REPAIR;  Surgeon: Lonni CINDERELLA Poli, MD;  Location: [facility] OR;  Service: Orthopedics;  Laterality: Left;   COLONOSCOPY WITH PROPOFOL  N/A 08/14/2017   Procedure: COLONOSCOPY WITH PROPOFOL ;  Surgeon: Unk Corinn Skiff, MD;  Location: [facility] ENDOSCOPY;  Service: Gastroenterology;  Laterality: N/A;   NO PAST SURGERIES       Current Outpatient Medications  Medication Sig Dispense Refill   ACCU-CHEK FASTCLIX LANCETS MISC USE AS DIRECTED TO CHECK BLOOD  SUAGR DX: E11.9  11   ACCU-CHEK GUIDE test strip USE AS DIRECTED TO TEST BLOOD SUGAR TWICE A DAY DX:E11.9  11   ASPIRIN  LOW DOSE 81 MG tablet Take 81 mg by mouth daily.     atorvastatin (LIPITOR) 40 MG tablet Take 40 mg by mouth at bedtime.     B-D ULTRAFINE III SHORT PEN 31G X 8 MM MISC USE WITH INSULIN  PEN INJECTIONS DAILY ( DX: E11.9)  11   Blood Glucose Monitoring Suppl (ACCU-CHEK GUIDE) w/Device KIT USE AS DIRECTED DX E11.9 (MEDICAID PREFERRED)  0   clonazePAM (KLONOPIN) 1 MG tablet Take 1 mg by mouth daily as needed for anxiety.     dexlansoprazole (DEXILANT) 60 MG capsule Take 1 capsule by mouth daily.     fenofibrate (TRICOR) 145 MG tablet Take 145 mg by mouth daily.     gabapentin (NEURONTIN) 300 MG capsule Take 300 mg by mouth daily as needed (for pain).     insulin  glargine (LANTUS ) 100 UNIT/ML injection Inject 50 Units into the skin daily as needed (for low blod sugar).     lisinopril -hydrochlorothiazide  (PRINZIDE ,ZESTORETIC ) 20-12.5 MG tablet Take 1 tablet by mouth daily. for high blood pressure  5   metFORMIN  (GLUCOPHAGE ) 1000 MG tablet Take 1,000 mg by mouth 2 (two) times daily with a meal.  nitroGLYCERIN  (NITROSTAT ) 0.4 MG SL tablet Place 0.4 mg under the tongue every 5 (five) minutes as needed for chest pain.     VICTOZA  18 MG/3ML SOPN Inject 1.2 mg into the skin once a week.     omeprazole  (PRILOSEC) 20 MG capsule Take 1 capsule (20 mg total) by mouth 2 (two) times daily before a meal. (Patient not taking: Reported on 06/10/2023) 28 capsule 0   No current facility-administered medications for this visit.    Allergies:   Patient has no known allergies.    Social History:  The patient  reports that he has been smoking cigars. He has never used smokeless tobacco. He reports current alcohol use. He reports that he does not use drugs.   Family History:  The patient's family history includes Diabetes in his father, mother, and sister.    ROS:  Please see the history of  present illness.   Otherwise, review of systems are positive for none.   All other systems are reviewed and negative.    PHYSICAL EXAM: VS:  BP (!) 140/86   Pulse 92   Ht 6' 4 (1.93 m)   Wt 270 lb (122.5 kg)   SpO2 96%   BMI 32.87 kg/m  , BMI Body mass index is 32.87 kg/m. GEN: Well nourished, well developed, in no acute distress HEENT: normal Neck: no JVD, carotid bruits, or masses Cardiac: RRR; no murmurs, rubs, or gallops,no edema  Respiratory:  clear to auscultation bilaterally, normal work of breathing GI: soft, nontender, nondistended, + BS MS: no deformity or atrophy Skin: warm and dry, no rash Neuro:  Strength and sensation are intact Psych: euthymic mood, full affect   EKG:  EKG is not ordered today.   Recent Labs: 05/12/2023: BUN 8; Creatinine, Ser 1.05; Hemoglobin 14.9; Platelets 241; Potassium 3.8; Sodium 135 05/29/2023: ALT 20    Lipid Panel No results found for: CHOL, TRIG, HDL, CHOLHDL, VLDL, LDLCALC, LDLDIRECT    Wt Readings from Last 3 Encounters:  06/10/23 270 lb (122.5 kg)  05/29/23 267 lb (121.1 kg)  05/12/23 260 lb (117.9 kg)      Other studies Reviewed: Additional studies/ records that were reviewed today include:   Coronary CTA 06/25/21:  Cardiac/Coronary  CTA   TECHNIQUE: The patient was scanned on a [employer].   FINDINGS: A 100 kV prospective scan was triggered in the descending thoracic aorta at 111 HU's. Axial non-contrast 3 mm slices were carried out through the heart. The data set was analyzed on a dedicated work station and scored using the Agatson method. Gantry rotation speed was 250 msecs and collimation was .6 mm. No beta blockade and 0.8 mg of sl NTG was given. The 3D data set was reconstructed in 5% intervals of the 67-82 % of the R-R cycle. Diastolic phases were analyzed on a dedicated work station using MPR, MIP and VRT modes. The patient received 80 cc of contrast.   Aorta: Normal size.  No  calcifications.  No dissection.   Aortic Valve:  Trileaflet.  No calcifications.   Coronary Arteries:  Normal coronary origin.  Right dominance.   RCA is a large dominant artery that gives rise to PDA and PLA. There is no plaque.   Left main is a large artery that gives rise to LAD, Intermedius Ramus and LCX arteries.   LAD is a large vessel that has no plaque.   LCX is a non-dominant artery that gives rise to one large OM1 branch. There is  no plaque.   Coronary Calcium Score:   Left main: 0   Left anterior descending artery: 0   Left circumflex artery: 0   Right coronary artery: 0   Total: 0   Percentile: 0   Other findings:   Normal pulmonary vein drainage into the left atrium.   Normal left atrial appendage without a thrombus.   Normal size of the pulmonary artery.   IMPRESSION: 1. Coronary calcium score of 0. This was 0 percentile for age and sex matched control.   2. Normal coronary origin with right dominance.   3. No evidence of CAD. CAD-RADS 0. No evidence of CAD (0%). Consider non-atherosclerotic causes of chest pain.     Electronically Signed   By: Kardie  Tobb D.O.   On: 06/26/2021 13:38         Echo 06/02/23: IMPRESSIONS     1. Left ventricular ejection fraction, by estimation, is 45 to 50%. Left  ventricular ejection fraction by 3D volume is 45 %. The left ventricle has  mildly decreased function. The left ventricle demonstrates global  hypokinesis. There is moderate  asymmetric left ventricular hypertrophy of the basal-septal segment (15  mm), no LVOT obstruction or SAM. Left ventricular diastolic parameters are  consistent with Grade I diastolic dysfunction (impaired relaxation).   2. Right ventricular systolic function is normal. The right ventricular  size is normal. Tricuspid regurgitation signal is inadequate for assessing  PA pressure.   3. Grossly normal LA reservoir strain, 43%.   4. The mitral valve is normal in structure.  Trivial mitral valve  regurgitation. No evidence of mitral stenosis.   5. The aortic valve is tricuspid. Aortic valve regurgitation is not  visualized. No aortic stenosis is present.   6. Aortic dilatation noted. There is mild dilatation of the ascending  aorta, measuring 43 mm.   7. The inferior vena cava is normal in size with greater than 50%  respiratory variability, suggesting right atrial pressure of 3 mmHg.     ASSESSMENT AND PLAN:  1.  Chest pain of uncertain etiology. I suspect a lot of this is somatization from stress. Prior CTA 2 years ago completely normal. Concern for ischemia is low. No cause for chest pain seen on Echo. Will observe for now.  2. HTN kBP is mildly elevated today but was normal 10 days ago. Continue lisinopril  HCT. Stressed importance of lifestyle modification including low sodium diet, exercise and avoidance of Etoh. 3. IDDM 4. HLD. On pravastatin . LDL is ok but triglycerides were markedly elevated- I suspect some of this related to Etoh use. Monitor. 5. Tobacco use - encourage smoking cessation. 6. Mild LV dysfunction. EF 50% on my review. Recommend continuing lisinopril  HCT. Optimal BP and DM control. Avoid Etoh. Repeat Echo in one year    Current medicines are reviewed at length with the patient today.  The patient does not have concerns regarding medicines.  The following changes have been made:  no change  Labs/ tests ordered today include:   No orders of the defined types were placed in this encounter.        Disposition:   FU with me 6 months.  Signed, Keira Bohlin, MD  06/10/2023 4:55 PM    [facility] [address], [place], [place], [address] Phone [phone], Fax [phone]

## 2023-06-11 ENCOUNTER — Ambulatory Visit: Payer: Medicaid Other | Admitting: Cardiology

## 2023-06-23 ENCOUNTER — Ambulatory Visit: Payer: Medicaid Other | Admitting: Cardiology

## 2024-03-19 ENCOUNTER — Encounter (HOSPITAL_COMMUNITY): Payer: Self-pay

## 2024-03-19 ENCOUNTER — Ambulatory Visit (HOSPITAL_COMMUNITY)
Admission: EM | Admit: 2024-03-19 | Discharge: 2024-03-19 | Disposition: A | Discharge status: Home / Self Care | Attending: Internal Medicine | Admitting: Internal Medicine

## 2024-03-19 ENCOUNTER — Telehealth: Payer: Self-pay | Admitting: Cardiology

## 2024-03-19 DIAGNOSIS — R0789 Other chest pain: Secondary | ICD-10-CM | POA: Diagnosis not present

## 2024-03-19 DIAGNOSIS — R1013 Epigastric pain: Secondary | ICD-10-CM | POA: Diagnosis not present

## 2024-03-19 MED ORDER — LIDOCAINE VISCOUS HCL 2 % MT SOLN
15.0000 mL | Freq: Once | OROMUCOSAL | Status: AC
  Administered 2024-03-19: 15 mL via OROMUCOSAL

## 2024-03-19 MED ORDER — ALUM & MAG HYDROXIDE-SIMETH 200-200-20 MG/5ML PO SUSP
ORAL | Status: AC
Start: 2024-03-19 — End: 2024-03-19
  Filled 2024-03-19: qty 30

## 2024-03-19 MED ORDER — ALUM & MAG HYDROXIDE-SIMETH 200-200-20 MG/5ML PO SUSP
30.0000 mL | Freq: Once | ORAL | Status: AC
Start: 2024-03-19 — End: 2024-03-19
  Administered 2024-03-19: 30 mL via ORAL

## 2024-03-19 MED ORDER — LIDOCAINE VISCOUS HCL 2 % MT SOLN
OROMUCOSAL | Status: AC
  Filled 2024-03-19: qty 15

## 2024-03-19 NOTE — Telephone Encounter (Signed)
 Pt c/o of Chest Pain: STAT if active (IN THIS MOMENT) CP, including tightness, pressure, jaw pain, shoulder/upper arm/back pain, SOB, nausea, and vomiting.  1. Are you having CP right now (tightness, pressure, or discomfort)?  No   2. Are you experiencing any other symptoms (ex. SOB, nausea, vomiting, sweating)?  No   3. How long have you been experiencing CP?  Dull discomfort for the past 3 days   4. Is your CP continuous or coming and going?  Coming and going   5. Have you taken Nitroglycerin ?   Yes, early this morning, but it didn't help at the time.

## 2024-03-19 NOTE — Discharge Instructions (Addendum)
 Your workup today is reassuring overall.  EKG looks good and appears similar to your last EKG that you had at your cardiology office.  I suspect your pain is due to either a muscle spasm or acid reflux.   We gave you a GI cocktail in the clinic today to help with acid reflux/gas.   Continue taking your acid reflux medication and tylenol  as needed for pain.   Apply warm compresses to the chest wall as needed for pain relief.   Please call your cardiologist and schedule an appointment for the next 5 to 7 days or sooner to be evaluated for your chest pain.  If your chest pain becomes more frequent or more severe, or if you develop dizziness, sweating, nausea, vomiting, or shortness of breath associated with chest pain, please go to the ER immediately.

## 2024-03-19 NOTE — Telephone Encounter (Signed)
 Attempted to contact pt regarding his complaints.  In review of chart pt was seen at [facility] this AM for chest pain.  Was given GI cocktail and advised to f/u with  his cardiologist.  Left message for pt to call back regarding s/s.  Advised if currently having chest pain he will need to report to closest ED for further evaluation and treatment or to c/b to discuss and schedule follow upas he is overdue.

## 2024-03-19 NOTE — ED Provider Notes (Signed)
 [facility]    CSN: [id number] Arrival date & time: 03/19/24  0910      History   Chief Complaint Chief Complaint  Patient presents with   Chest Pain    HPI Christopher Abbott is a 48 y.o. male.   Christopher Abbott is a 48 y.o. male presenting for chief complaint of chest pain to the left upper chest that started 3 days ago. Chest pain starts at the left axillary/nipple line and intermittently travels medially to the left upper chest along the nipple line. Pain never goes into the right side of the chest. Pain comes and goes and is triggered by stress when he has to fuss at his children or when he laughs too loud or gets worked up. He is currently asymptomatic and does not have chest pain. When the pain happens, he rates it at a 5/10 and describes it as a somewhat sharp twinge that is sometimes dull and achy. Pain doesn't last long when it happens and goes away on its own without intervention.  He denies associated fatigue, shortness of breath, dizziness, fever, chills, heart palpitations, nausea, vomiting, abdominal pain, upper back pain, and rash to the overlying skin of the chest.  Denies recent cough/congestion, recent heavy lifting, and recent trauma/injuries to the chest wall.  Denies illicit drug use.  He sometimes smokes Black and milds, otherwise denies tobacco use.  He admits to feeling increased belching over the last few days and has questioned if chest pain is due to gas. He takes Prilosec daily for acid reflux and denies recent intake of spicy/acidic foods and frequent intake of NSAIDs.  Denies recent excessive use of alcohol.  He has a history of atypical chest pain and sees [facility] cardiology.  Last visit with cardiology was in January 2025 for echocardiogram was normal.  He has nitroglycerin  prescription and has taken 1 pill of nitroglycerin  3 days ago when chest pain started without relief.  He is also taking Tylenol  without relief.   Chest Pain   Past Medical  History:  Diagnosis Date   Diabetes mellitus    type 2   GERD (gastroesophageal reflux disease)    Hyperlipidemia    Hypertension     Patient Active Problem List   Diagnosis Date Noted   Internal hemorrhoid, bleeding 08/29/2017   Type 2 diabetes mellitus (HCC) 08/06/2017   Achilles rupture, left 07/18/2015   Rupture of left Achilles tendon 07/18/2015    Past Surgical History:  Procedure Laterality Date   ACHILLES TENDON SURGERY Left 07/18/2015   Procedure: LEFT ACHILLES TENDON DIRECT PRIMARY REPAIR;  Surgeon: Lonni CINDERELLA Poli, MD;  Location: [facility] OR;  Service: Orthopedics;  Laterality: Left;   COLONOSCOPY WITH PROPOFOL  N/A 08/14/2017   Procedure: COLONOSCOPY WITH PROPOFOL ;  Surgeon: Unk Corinn Skiff, MD;  Location: [facility] ENDOSCOPY;  Service: Gastroenterology;  Laterality: N/A;   NO PAST SURGERIES         Home Medications    Prior to Admission medications   Medication Sig Start Date End Date Taking? Authorizing Provider  ACCU-CHEK FASTCLIX LANCETS MISC USE AS DIRECTED TO CHECK BLOOD SUAGR DX: E11.9 08/28/17   [provider]  ACCU-CHEK GUIDE test strip USE AS DIRECTED TO TEST BLOOD SUGAR TWICE A DAY DX:E11.9 08/29/17   [provider]  ASPIRIN  LOW DOSE 81 MG tablet Take 81 mg by mouth daily. 05/06/23   [provider]  atorvastatin (LIPITOR) 40 MG tablet Take 40 mg by mouth at bedtime. 11/09/21   [provider]  B-D ULTRAFINE III SHORT PEN 31G X 8 MM MISC USE WITH INSULIN  PEN INJECTIONS DAILY ( DX: E11.9) 09/17/17   [provider]  Blood Glucose Monitoring Suppl (ACCU-CHEK GUIDE) w/Device KIT USE AS DIRECTED DX E11.9 (MEDICAID PREFERRED) 08/28/17   [provider]  clonazePAM (KLONOPIN) 1 MG tablet Take 1 mg by mouth daily as needed for anxiety.    [provider]  dexlansoprazole (DEXILANT) 60 MG capsule Take 1 capsule by mouth daily. 12/07/21   [provider]  fenofibrate (TRICOR) 145 MG tablet Take 145 mg  by mouth daily. 05/08/23   [provider]  gabapentin (NEURONTIN) 300 MG capsule Take 300 mg by mouth daily as needed (for pain). 05/06/23   [provider]  insulin  glargine (LANTUS ) 100 UNIT/ML injection Inject 50 Units into the skin daily as needed (for low blod sugar).    [provider]  lisinopril -hydrochlorothiazide  (PRINZIDE ,ZESTORETIC ) 20-12.5 MG tablet Take 1 tablet by mouth daily. for high blood pressure 07/02/15   [provider]  metFORMIN  (GLUCOPHAGE ) 1000 MG tablet Take 1,000 mg by mouth 2 (two) times daily with a meal.    [provider]  nitroGLYCERIN  (NITROSTAT ) 0.4 MG SL tablet Place 0.4 mg under the tongue every 5 (five) minutes as needed for chest pain. 05/06/23   [provider]  omeprazole  (PRILOSEC) 20 MG capsule Take 1 capsule (20 mg total) by mouth 2 (two) times daily before a meal. Patient not taking: Reported on 06/10/2023 01/16/22   Stacia Glendia BRAVO, MD  VICTOZA  18 MG/3ML SOPN Inject 1.2 mg into the skin once a week. 12/25/21   [provider]    Family History Family History  Problem Relation Age of Onset   Diabetes Mother    Diabetes Father    Diabetes Sister    Colon cancer Neg Hx    Stomach cancer Neg Hx    Esophageal cancer Neg Hx    Colon polyps Neg Hx     Social History Social History   Tobacco Use   Smoking status: Every Day    Types: Cigars   Smokeless tobacco: Never  Vaping Use   Vaping status: Never Used  Substance Use Topics   Alcohol use: Yes    Comment: socially   Drug use: No     Allergies   Patient has no known allergies.   Review of Systems Review of Systems  Cardiovascular:  Positive for chest pain.  Per HPI   Physical Exam Triage Vital Signs ED Triage Vitals  Encounter Vitals Group     BP 03/19/24 0930 (!) 125/105     Girls Systolic BP Percentile --      Girls Diastolic BP Percentile --      Boys Systolic BP Percentile --      Boys Diastolic BP  Percentile --      Pulse Rate 03/19/24 0930 78     Resp 03/19/24 0930 16     Temp 03/19/24 0930 98.2 F (36.8 C)     Temp Source 03/19/24 0930 Oral     SpO2 03/19/24 0930 100 %     Weight --      Height --      Head Circumference --      Peak Flow --      Pain Score 03/19/24 0932 0     Pain Loc --      Pain Education --      Exclude from Growth Chart --  No data found.  Updated Vital Signs BP (!) 125/105 (BP Location: Right Arm)   Pulse 78   Temp 98.2 F (36.8 C) (Oral)   Resp 16   SpO2 100%   Visual Acuity Right Eye Distance:   Left Eye Distance:   Bilateral Distance:    Right Eye Near:   Left Eye Near:    Bilateral Near:     Physical Exam Vitals and nursing note reviewed.  Constitutional:      Appearance: He is not ill-appearing or toxic-appearing.  HENT:     Head: Normocephalic and atraumatic.     Right Ear: Hearing and external ear normal.     Left Ear: Hearing and external ear normal.     Nose: Nose normal.     Mouth/Throat:     Lips: Pink.     Mouth: Mucous membranes are moist. No injury or oral lesions.     Dentition: Normal dentition.     Tongue: No lesions.     Pharynx: Oropharynx is clear. Uvula midline. No pharyngeal swelling, oropharyngeal exudate, posterior oropharyngeal erythema, uvula swelling or postnasal drip.     Tonsils: No tonsillar exudate.  Eyes:     General: Lids are normal. Vision grossly intact. Gaze aligned appropriately.     Extraocular Movements: Extraocular movements intact.     Conjunctiva/sclera: Conjunctivae normal.  Neck:     Trachea: Trachea and phonation normal.  Cardiovascular:     Rate and Rhythm: Normal rate and regular rhythm.     Heart sounds: Normal heart sounds, S1 normal and S2 normal.  Pulmonary:     Effort: Pulmonary effort is normal. No respiratory distress.     Breath sounds: Normal breath sounds and air entry. No stridor. No wheezing, rhonchi or rales.  Chest:     Chest wall: No tenderness (Tenderness is  not reproducible on palpation of the chest wall.).  Musculoskeletal:     Cervical back: Neck supple.  Lymphadenopathy:     Cervical: No cervical adenopathy.  Skin:    General: Skin is warm and dry.     Capillary Refill: Capillary refill takes less than 2 seconds.     Findings: No rash.  Neurological:     General: No focal deficit present.     Mental Status: He is alert and oriented to person, place, and time. Mental status is at baseline.     Cranial Nerves: No dysarthria or facial asymmetry.  Psychiatric:        Mood and Affect: Mood normal.        Speech: Speech normal.        Behavior: Behavior normal.        Thought Content: Thought content normal.        Judgment: Judgment normal.      UC Treatments / Results  Labs (all labs ordered are listed, but only abnormal results are displayed) Labs Reviewed - No data to display  EKG   Radiology No results found.  Procedures Procedures (including critical care time)  Medications Ordered in UC Medications  alum & mag hydroxide-simeth (MAALOX/MYLANTA) 200-200-20 MG/5ML suspension 30 mL (30 mLs Oral Given 03/19/24 1011)  lidocaine  (XYLOCAINE ) 2 % viscous mouth solution 15 mL (15 mLs Mouth/Throat Given 03/19/24 1011)    Initial Impression / Assessment and Plan / UC Course  I have reviewed the triage vital signs and the nursing notes.  Pertinent labs & imaging results that were available during my care of the patient were reviewed by  me and considered in my medical decision making (see chart for details).   1.  Atypical chest pain Low suspicion for emergent causes of patient's chest pain.  He is currently asymptomatic.  Pericarditis, chest wall tenderness, acute MI, infectious process, acid reflux, and PE considered. Patient is low risk for acute coronary event based on risk factors.   Deferred imaging given clear cardiopulmonary exam, hemodynamically stable vital signs, clear lung sounds. EKG shows normal sinus rhythm with  nonspecific ST and T wave abnormality.  Reviewed previous EKG on file which shows similar findings to today's EKG without acute changes.   Will manage this as a chest wall strain with tylenol , heat/gentle ROM exercises. Tylenol  PRN.  GI cocktail given in clinic today.  Recommend follow-up with cardiologist in 5-7 days, phone number given on paperwork. ER precautions discussed should chest pain return, become more frequent, or become more severe.   Counseled patient on potential for adverse effects with medications prescribed/recommended today, strict ER and return-to-clinic precautions discussed, patient verbalized understanding.    Final Clinical Impressions(s) / UC Diagnoses   Final diagnoses:  Atypical chest pain     Discharge Instructions      Your workup today is reassuring overall.  EKG looks good and appears similar to your last EKG that you had at your cardiology office.  I suspect your pain is due to either a muscle spasm or acid reflux.   We gave you a GI cocktail in the clinic today to help with acid reflux/gas.   Continue taking your acid reflux medication and tylenol  as needed for pain.   Apply warm compresses to the chest wall as needed for pain relief.   Please call your cardiologist and schedule an appointment for the next 5 to 7 days or sooner to be evaluated for your chest pain.  If your chest pain becomes more frequent or more severe, or if you develop dizziness, sweating, nausea, vomiting, or shortness of breath associated with chest pain, please go to the ER immediately.    ED Prescriptions   None    PDMP not reviewed this encounter.   Enedelia Dorna HERO, [place] 03/19/24 1028

## 2024-03-19 NOTE — ED Triage Notes (Signed)
 Patient reports that he has had intermittent left chest pain x 3 days. Patient states when he talks loud and gets excited he begins to have chest pain. Patient denies SOB, nausea, diaphoresis. Patient states, I feel normal, except for the chest discomfort.

## 2024-03-23 NOTE — Telephone Encounter (Signed)
 Pt reports that he is much improved, he thinks he may have been some gas or something like that.  Symptoms have resolved and not returned. Aware office will contact him to arrange a follow up visit
